# Patient Record
Sex: Female | Born: 1950 | Race: White | Hispanic: No | Smoking: Current every day smoker
Health system: Southern US, Community
[De-identification: ages and names within clinical notes are randomized; demographics above are authoritative.]

## PROBLEM LIST (undated history)

## (undated) DIAGNOSIS — F419 Anxiety disorder, unspecified: Secondary | ICD-10-CM

## (undated) DIAGNOSIS — I10 Essential (primary) hypertension: Secondary | ICD-10-CM

## (undated) DIAGNOSIS — J449 Chronic obstructive pulmonary disease, unspecified: Secondary | ICD-10-CM

## (undated) DIAGNOSIS — K219 Gastro-esophageal reflux disease without esophagitis: Secondary | ICD-10-CM

## (undated) DIAGNOSIS — K439 Ventral hernia without obstruction or gangrene: Secondary | ICD-10-CM

## (undated) DIAGNOSIS — G8929 Other chronic pain: Secondary | ICD-10-CM

## (undated) DIAGNOSIS — C4491 Basal cell carcinoma of skin, unspecified: Secondary | ICD-10-CM

## (undated) DIAGNOSIS — C801 Malignant (primary) neoplasm, unspecified: Secondary | ICD-10-CM

## (undated) DIAGNOSIS — F32A Depression, unspecified: Secondary | ICD-10-CM

## (undated) DIAGNOSIS — F329 Major depressive disorder, single episode, unspecified: Secondary | ICD-10-CM

## (undated) HISTORY — PX: COLON SURGERY: SHX602

## (undated) HISTORY — DX: Anxiety disorder, unspecified: F41.9

## (undated) HISTORY — DX: Chronic obstructive pulmonary disease, unspecified: J44.9

## (undated) HISTORY — DX: Major depressive disorder, single episode, unspecified: F32.9

## (undated) HISTORY — PX: TUBAL LIGATION: SHX77

## (undated) HISTORY — DX: Depression, unspecified: F32.A

---

## 2003-09-16 ENCOUNTER — Other Ambulatory Visit: Payer: Self-pay

## 2005-11-07 ENCOUNTER — Other Ambulatory Visit: Payer: Self-pay

## 2005-11-07 ENCOUNTER — Inpatient Hospital Stay: Payer: Self-pay

## 2006-03-05 ENCOUNTER — Emergency Department: Payer: Self-pay | Admitting: Emergency Medicine

## 2006-03-10 ENCOUNTER — Emergency Department: Payer: Self-pay | Admitting: Emergency Medicine

## 2006-10-10 ENCOUNTER — Emergency Department: Payer: Self-pay | Admitting: Emergency Medicine

## 2007-01-02 ENCOUNTER — Emergency Department: Payer: Self-pay | Admitting: Emergency Medicine

## 2007-02-17 ENCOUNTER — Ambulatory Visit: Payer: Self-pay | Admitting: Family Medicine

## 2007-04-14 ENCOUNTER — Ambulatory Visit: Payer: Self-pay | Admitting: Specialist

## 2007-04-23 ENCOUNTER — Other Ambulatory Visit: Payer: Self-pay

## 2007-04-23 ENCOUNTER — Ambulatory Visit: Payer: Self-pay | Admitting: Specialist

## 2007-04-30 ENCOUNTER — Ambulatory Visit: Payer: Self-pay | Admitting: Specialist

## 2008-05-24 ENCOUNTER — Ambulatory Visit: Payer: Self-pay | Admitting: Family Medicine

## 2008-09-03 ENCOUNTER — Emergency Department: Payer: Self-pay | Admitting: Emergency Medicine

## 2009-04-16 ENCOUNTER — Emergency Department: Payer: Self-pay | Admitting: Emergency Medicine

## 2010-06-19 ENCOUNTER — Encounter: Payer: Self-pay | Admitting: Specialist

## 2010-10-05 ENCOUNTER — Inpatient Hospital Stay: Payer: Self-pay | Admitting: Internal Medicine

## 2011-07-29 DIAGNOSIS — J449 Chronic obstructive pulmonary disease, unspecified: Secondary | ICD-10-CM | POA: Insufficient documentation

## 2011-07-29 DIAGNOSIS — C189 Malignant neoplasm of colon, unspecified: Secondary | ICD-10-CM | POA: Insufficient documentation

## 2011-07-29 DIAGNOSIS — I10 Essential (primary) hypertension: Secondary | ICD-10-CM | POA: Insufficient documentation

## 2011-07-29 DIAGNOSIS — F329 Major depressive disorder, single episode, unspecified: Secondary | ICD-10-CM | POA: Insufficient documentation

## 2011-07-29 DIAGNOSIS — F32A Depression, unspecified: Secondary | ICD-10-CM | POA: Insufficient documentation

## 2011-07-29 DIAGNOSIS — K439 Ventral hernia without obstruction or gangrene: Secondary | ICD-10-CM | POA: Insufficient documentation

## 2011-11-12 ENCOUNTER — Ambulatory Visit: Payer: Self-pay | Admitting: Specialist

## 2011-12-23 ENCOUNTER — Encounter: Payer: Self-pay | Admitting: Specialist

## 2012-01-05 ENCOUNTER — Encounter: Payer: Self-pay | Admitting: Specialist

## 2012-02-04 ENCOUNTER — Encounter: Payer: Self-pay | Admitting: Specialist

## 2012-03-06 ENCOUNTER — Encounter: Payer: Self-pay | Admitting: Specialist

## 2012-04-05 ENCOUNTER — Encounter: Payer: Self-pay | Admitting: Specialist

## 2012-04-27 DIAGNOSIS — G8929 Other chronic pain: Secondary | ICD-10-CM | POA: Insufficient documentation

## 2012-04-30 ENCOUNTER — Ambulatory Visit: Payer: Self-pay | Admitting: Specialist

## 2012-05-06 ENCOUNTER — Encounter: Payer: Self-pay | Admitting: Specialist

## 2012-06-06 ENCOUNTER — Encounter: Payer: Self-pay | Admitting: Specialist

## 2012-10-19 ENCOUNTER — Encounter: Payer: Self-pay | Admitting: Specialist

## 2012-11-06 ENCOUNTER — Encounter: Payer: Self-pay | Admitting: Specialist

## 2012-11-15 ENCOUNTER — Ambulatory Visit: Payer: Self-pay | Admitting: Specialist

## 2012-11-23 DIAGNOSIS — Z720 Tobacco use: Secondary | ICD-10-CM | POA: Insufficient documentation

## 2012-11-23 DIAGNOSIS — F17200 Nicotine dependence, unspecified, uncomplicated: Secondary | ICD-10-CM | POA: Insufficient documentation

## 2012-11-23 DIAGNOSIS — IMO0002 Reserved for concepts with insufficient information to code with codable children: Secondary | ICD-10-CM | POA: Insufficient documentation

## 2012-11-23 DIAGNOSIS — R918 Other nonspecific abnormal finding of lung field: Secondary | ICD-10-CM | POA: Insufficient documentation

## 2012-12-01 ENCOUNTER — Ambulatory Visit: Payer: Self-pay | Admitting: Internal Medicine

## 2012-12-04 ENCOUNTER — Ambulatory Visit: Payer: Self-pay | Admitting: Internal Medicine

## 2012-12-04 ENCOUNTER — Encounter: Payer: Self-pay | Admitting: Specialist

## 2013-01-04 ENCOUNTER — Ambulatory Visit: Payer: Self-pay | Admitting: Internal Medicine

## 2013-01-04 ENCOUNTER — Encounter: Payer: Self-pay | Admitting: Specialist

## 2013-02-07 DIAGNOSIS — D071 Carcinoma in situ of vulva: Secondary | ICD-10-CM | POA: Insufficient documentation

## 2013-02-07 DIAGNOSIS — C44721 Squamous cell carcinoma of skin of unspecified lower limb, including hip: Secondary | ICD-10-CM | POA: Insufficient documentation

## 2013-02-07 DIAGNOSIS — C4491 Basal cell carcinoma of skin, unspecified: Secondary | ICD-10-CM | POA: Insufficient documentation

## 2013-04-11 DIAGNOSIS — Z7189 Other specified counseling: Secondary | ICD-10-CM | POA: Insufficient documentation

## 2013-05-12 ENCOUNTER — Ambulatory Visit: Payer: Self-pay

## 2013-05-16 ENCOUNTER — Ambulatory Visit: Payer: Self-pay | Admitting: Specialist

## 2013-05-24 ENCOUNTER — Ambulatory Visit: Payer: Self-pay | Admitting: Hematology and Oncology

## 2013-06-20 ENCOUNTER — Ambulatory Visit: Payer: Self-pay | Admitting: Gynecologic Oncology

## 2013-06-23 ENCOUNTER — Ambulatory Visit: Payer: Self-pay | Admitting: Medical

## 2013-07-06 ENCOUNTER — Ambulatory Visit: Payer: Self-pay | Admitting: Gynecologic Oncology

## 2013-08-28 ENCOUNTER — Observation Stay: Payer: Self-pay | Admitting: Internal Medicine

## 2013-08-28 LAB — BASIC METABOLIC PANEL
Anion Gap: 8 (ref 7–16)
BUN: 7 mg/dL (ref 7–18)
Calcium, Total: 9.3 mg/dL (ref 8.5–10.1)
Chloride: 91 mmol/L — ABNORMAL LOW (ref 98–107)
Co2: 34 mmol/L — ABNORMAL HIGH (ref 21–32)
Creatinine: 0.46 mg/dL — ABNORMAL LOW (ref 0.60–1.30)
EGFR (African American): >60
EGFR (Non-African Amer.): >60
Glucose: 105 mg/dL — ABNORMAL HIGH (ref 65–99)
Osmolality: 265 (ref 275–301)
Potassium: 3.6 mmol/L (ref 3.5–5.1)
Sodium: 133 mmol/L — ABNORMAL LOW (ref 136–145)

## 2013-08-28 LAB — CBC
HCT: 42 % (ref 35.0–47.0)
HGB: 14.4 g/dL (ref 12.0–16.0)
MCH: 32.9 pg (ref 26.0–34.0)
MCHC: 34.2 g/dL (ref 32.0–36.0)
MCV: 96 fL (ref 80–100)
Platelet: 317 10*3/uL (ref 150–440)
RBC: 4.37 10*6/uL (ref 3.80–5.20)
RDW: 14.3 % (ref 11.5–14.5)
WBC: 6.1 10*3/uL (ref 3.6–11.0)

## 2013-08-28 LAB — TROPONIN I
Troponin-I: <0.02 ng/mL
Troponin-I: <0.02 ng/mL
Troponin-I: <0.02 ng/mL

## 2013-08-28 LAB — PRO B NATRIURETIC PEPTIDE: B-Type Natriuretic Peptide: 64 pg/mL (ref 0–125)

## 2013-08-28 LAB — MAGNESIUM: Magnesium: 1.7 mg/dL — ABNORMAL LOW

## 2013-08-29 LAB — CBC WITH DIFFERENTIAL/PLATELET
Basophil #: 0.1 10*3/uL (ref 0.0–0.1)
Basophil %: 1.1 %
Eosinophil #: 0.2 10*3/uL (ref 0.0–0.7)
Eosinophil %: 3.8 %
HCT: 43.3 % (ref 35.0–47.0)
HGB: 14.4 g/dL (ref 12.0–16.0)
Lymphocyte #: 2.5 10*3/uL (ref 1.0–3.6)
Lymphocyte %: 45.6 %
MCH: 32.4 pg (ref 26.0–34.0)
MCHC: 33.3 g/dL (ref 32.0–36.0)
MCV: 97 fL (ref 80–100)
Monocyte #: 0.6 x10 3/mm (ref 0.2–0.9)
Monocyte %: 11.5 %
Neutrophil #: 2.1 10*3/uL (ref 1.4–6.5)
Neutrophil %: 38 %
Platelet: 310 10*3/uL (ref 150–440)
RBC: 4.44 10*6/uL (ref 3.80–5.20)
RDW: 14 % (ref 11.5–14.5)
WBC: 5.5 10*3/uL (ref 3.6–11.0)

## 2013-08-29 LAB — BASIC METABOLIC PANEL
Anion Gap: 2 — ABNORMAL LOW (ref 7–16)
BUN: 8 mg/dL (ref 7–18)
Calcium, Total: 9.5 mg/dL (ref 8.5–10.1)
Chloride: 95 mmol/L — ABNORMAL LOW (ref 98–107)
Co2: 37 mmol/L — ABNORMAL HIGH (ref 21–32)
Creatinine: 0.51 mg/dL — ABNORMAL LOW (ref 0.60–1.30)
EGFR (African American): >60
EGFR (Non-African Amer.): >60
Glucose: 95 mg/dL (ref 65–99)
Osmolality: 266 (ref 275–301)
Potassium: 3.4 mmol/L — ABNORMAL LOW (ref 3.5–5.1)
Sodium: 134 mmol/L — ABNORMAL LOW (ref 136–145)

## 2013-08-29 LAB — LIPID PANEL
Cholesterol: 172 mg/dL (ref 0–200)
HDL Cholesterol: 65 mg/dL — ABNORMAL HIGH (ref 40–60)
Ldl Cholesterol, Calc: 76 mg/dL (ref 0–100)
Triglycerides: 153 mg/dL (ref 0–200)
VLDL Cholesterol, Calc: 31 mg/dL (ref 5–40)

## 2013-09-13 ENCOUNTER — Ambulatory Visit: Payer: Self-pay | Admitting: Gynecologic Oncology

## 2013-10-06 ENCOUNTER — Ambulatory Visit: Payer: Self-pay | Admitting: Gynecologic Oncology

## 2013-12-06 ENCOUNTER — Ambulatory Visit: Payer: Self-pay | Admitting: Gynecologic Oncology

## 2014-01-04 ENCOUNTER — Ambulatory Visit: Payer: Self-pay | Admitting: Specialist

## 2014-01-16 ENCOUNTER — Ambulatory Visit: Payer: Self-pay | Admitting: Gynecologic Oncology

## 2014-06-09 DIAGNOSIS — F1120 Opioid dependence, uncomplicated: Secondary | ICD-10-CM | POA: Insufficient documentation

## 2014-10-12 DIAGNOSIS — B070 Plantar wart: Secondary | ICD-10-CM | POA: Insufficient documentation

## 2014-10-12 DIAGNOSIS — M79673 Pain in unspecified foot: Secondary | ICD-10-CM | POA: Insufficient documentation

## 2014-10-12 DIAGNOSIS — L851 Acquired keratosis [keratoderma] palmaris et plantaris: Secondary | ICD-10-CM | POA: Insufficient documentation

## 2014-11-06 ENCOUNTER — Ambulatory Visit: Payer: Self-pay | Admitting: Pain Medicine

## 2015-01-26 NOTE — H&P (Signed)
PATIENT NAME:  Shirley Alvarado, Shirley Alvarado MR#:  [id number] DATE OF BIRTH:  1951/02/11  DATE OF ADMISSION:  08/28/2013  ADDENDUM:  The patient is a heavy smoker, ongoing smoking and smoking cessation counseling given to the patient for over 5 minutes. Patient decided to try to quit smoking at her own pace and does not require any help at this moment.    ____________________________ [place] Sink, MD rsg:cs D: 08/28/2013 16:25:18 ET T: 08/28/2013 18:57:19 ET JOB#: [id number]  cc: [place] Sink, MD, <Dictator> Romilda Proby America Brown MD ELECTRONICALLY SIGNED 08/29/2013 13:11

## 2015-01-26 NOTE — H&P (Signed)
PATIENT NAME:  Shirley Alvarado, Shirley Alvarado MR#:  [id number] DATE OF BIRTH:  03-10-1951  DATE OF ADMISSION:  08/28/2013  REFERRING PHYSICIAN: Dr. Conni Slipper.   CHIEF COMPLAINT: Chest pain.   HISTORY OF PRESENT ILLNESS: A very nice 64 year old female with history of COPD with  chronic respiratory failure, oxygen dependent at 2 to 3 L of oxygen at home. Also has depression, history of ongoing smoking, hypertension, colon cancer, and chronic pain syndrome due to abdominal pain secondary to hernia that has a mesh that is creating significant pressure into the abdominal fat. The patient has pain control with her primary care physician, and comes today with a history of chest pain that happened within the last week.  It has been going on and off. The patient says that she has been getting really tired and does not feel quite well. She actually slept for 30 hours in the past 2 days and woke up with significant left arm pain and numbness, which is likely secondary to the position that she was sleeping in. She developed chest pain on the left side, again within the last week. It comes and goes, lasts for 5 to 6 minutes, and then goes away. The patient has these episodes of chest pain happening a lot during the day. They are described as sharp, with no radiation of the pain, with intensity of 6 out of 10, and they are located under the left breast. There is no significant trigger.  Activity or not, the patient could have the pain, resting or loading her dishwasher. The patient has significant shortness of breath, is at her baseline, and now she says this has been going on for the past week. The patient states that actually her breathing is much better than it has been in the past couple of months because she is reducing the amount of cigarettes that she smokes, and there are no increased secretions. No increased shortness of breath. No fever. The patient is admitted for evaluation of chest pain, which is overall reproducible with  palpation, but the patient has multiple risk factors and merits to at least be observed. The patient and family would like to stay and be observed as well.   REVIEW OF SYSTEMS: A 12-system review of systems is done.  CONSTITUTIONAL: No fever. No weight loss or weight gain. Positive fatigue, which is chronic, but it has been exacerbated within the last week.  EYES: No blurry vision, double vision, or glaucoma.  EARS, NOSE, AND THROAT: No tinnitus, ear pain, or difficulty hearing. No epistaxis.  RESPIRATORY: Chronic cough, chronic wheezing, COPD, chronic respiratory failure at 2 to 3 L of oxygen at home.  CARDIOVASCULAR: Positive chest pain as mentioned above, reproducible completely, in a single spot at the level of the margin of the ribs on the left lower chest. No orthopnea, no edema. The patient has chronic chest pain, which she says is pressure-like due to her COPD whenever she tries to take deep breaths, and that has not changed.  GASTROINTESTINAL: No nausea, vomiting, constipation, diarrhea.  GENITOURINARY: No dysuria, hematuria, or changes in frequency.  ENDOCRINE: No polyuria, polydipsia, polyphagia. No cold or heat intolerance.  HEMATOLOGIC AND LYMPHATIC: No anemia, easy bruising, bleeding, or swollen glands.  MUSCULOSKELETAL: No significant joint effusions or gout.  NEUROLOGIC: No numbness, tingling, CVAs, or TIAs.   PSYCHIATRIC: No significant insomnia or depression at this moment. She has a history of chronic depression that has been apparently well controlled.   PAST MEDICAL HISTORY: 1.  COPD.    2.  Chronic respiratory failure, oxygen-dependent, 2 to 3 L.  3.  Depression, stable. 4.  Eczema.  5.  History of colon cancer, now resected.  6.  Hypertension.  7.  Chronic pain syndrome due to abdominal pain.   ALLERGIES: Not known drug allergies.   SOCIAL HISTORY: The patient smokes half to one-third pack a day, started at the age of 34, and she has quit a couple of times for  several months and then started smoking again. She lives with her son, who also has a wife and 2 kids. She uses alcohol very rarely. She used to smoke 2 packs a day prior to that.   FAMILY HISTORY: Positive for renal failure in her father and some type of cancer of unknown origin in her mother, who died at the age of 9.   CURRENT MEDICATIONS: The patient is not sure of the medications that she is taking. She tells me that they might have changed. The last set of medication she had on her chart is theophylline 200 mg every 12 hours, Spiriva 18 mcg once a day, Singulair 10 mg once a day, ProAir HFA 2 puffs every 6 hours, prednisone taper, paroxetine 40 mg daily, oxycodone 30 mg every 4 hours as needed for pain, Nitrostat as needed for chest pain, Nasonex 50 mcg as needed for congestion, hydrochlorothiazide with losartan 25/100 once a day, diazepam 10 mg every 6 hours, amlodipine 10 mg once a day, Advair Diskus 250/50. We are going to call [facility] to figure out if those are the same medications or if there have been any changes.   PAST SURGICAL HISTORY:  1.  Knee surgery.  2.  Colectomy.  3.  Hernioplasty.  4.  Tubal ligation.   PHYSICAL EXAMINATION: VITAL SIGNS:  Blood pressure 131/78, pulse 76, respirations 20, temperature 98.3, oxygen saturation is 96% on 3 L of oxygen.  GENERAL: The patient is alert, oriented x3, in no acute distress. No respiratory distress. Hemodynamically stable.  HEENT: Pupils are equal and reactive. Extraocular movements are intact. Mucosa are moist. Anicteric sclerae. Pink conjunctivae. No oral lesions. No oropharyngeal exudates.  NECK: Supple. No JVD. No thyromegaly. No adenopathy. No carotid bruits. No rigidity.  CARDIOVASCULAR: Regular rate and rhythm. No murmurs, rubs, or gallops are appreciated at this moment. The patient has a fully reproducible chest pain with palpation and pressure at the level of the left costophrenic under her left breast, which she feels is exactly  the same as the pain that she has been having recently. No displacement of PMI.  LUNGS: Clear without any wheezing or crepitus at this moment. Decreased respiratory sounds on bases are positive, but no wheezing, no crackles, no rhonchi at this moment. There is no use of accessory muscles.  ABDOMEN: Soft. Has significant incision in the midline that is healed, without any breakdown of the skin. She has multiple protrusions under the skin from possible intestine or abdominal fat, which is secondary to ventral hernia. There are no signs of any infection of the skin. Tenderness to palpation throughout all of the stomach, which is chronic. No significant guarding. No acute changes. Bowel sounds are positive.  GENITAL: Deferred.  EXTREMITIES: No edema, cyanosis, or clubbing.  VASCULAR: Pulses +2. Capillary refill less than 3.  LYMPHATIC: Negative for lymphadenopathy in the neck or supraclavicular areas.  SKIN: No rashes, petechiae, or sites of infections.  MUSCULOSKELETAL: No significant abnormalities of joints or joint effusions. No deformity.  NEUROLOGIC: Cranial nerves  II through XII intact. No focal findings. The strength is 5/5 in all 4 extremities.  PSYCHIATRIC: No significant agitation, hallucinations. The patient is alert, oriented x3.  LABORATORY, DIAGNOSTIC AND RADIOLOGICAL DATA: Her glucose is 105, BUN is 7, creatinine 0.46, sodium 133, chloride 91, CO2 of 34, troponin 0.02, white count 6.1, hemoglobin 14.4, platelet count 314.   Chest x-ray: No significant acute findings, although, as described by the radiologist, an ill-defined opacity on the medial aspect of the right lower lung that could represent development of airspace consolidation. Clinical correlation is recommended. When compared this chest x-ray with previous x-rays in 2011, it is exactly the same, no significant health changes. The patient does not have any worsening of the cough. She actually feels like she is better on her breathing  pattern, and she feels like she is producing less secretions because she is smoking less. There is no fever. No increase in white count, for what I do not think this is a pertinent finding. Patient is going to have a CT scan done within the next month for followup on pulmonary nodule that has been seen on CT scan back in February 2014, and then on 05/16/2013. The patient is actually stable, and she is okay waiting for a CT scan outpatient.   ASSESSMENT AND PLAN:  1.  Chest pain. This is likely musculoskeletal, fully reproducible, but the patient has significant risk factors and she is also not comfortable going home at this moment. We are going to admit her and put her under observation. Cardiac enzymes are going to be checked x3. I do not think that she needs a stress test due to the physical findings and reproducibility. We are going to do cardiac enzymes. If the cardiac enzymes are negative, the patient can go home tomorrow. We are only going to do the cardiac enzymes. I do not think she needs any further work-up.  2.  As far as her pain, we are going to continue treatment with local therapy. The patient can have a heating pad and try to avoid any contact with the affected area. As far as her medications, we are going to do aspirin for now until we figure out if there is any change in her troponin. The patient is not going to be on a beta blocker due to her severe chronic obstructive pulmonary disease with respiratory failure. Nitroglycerin p.r.n. Continue management of chronic abdominal pain.  3.  Chronic obstructive pulmonary disease. No signs of exacerbation. No signs of pneumonia, clinically. We are not going to start her on any antibiotics or new steroids.  4.  History of pulmonary nodule. Followup CT with Dr. Raul Del.  5.  Depression seems to be stable. Continue medication management.  6.  Chronic respiratory failure. Continue oxygen 2 to 3 L nasal cannula.  7.  Deep vein thrombosis prophylaxis  with heparin and gastrointestinal prophylaxis with proton pump inhibitor.   TIME SPENT: I spent about 45 minutes with this patient.     ____________________________ [place] Sink, MD rsg:cg D: 08/28/2013 16:23:11 ET T: 08/29/2013 04:10:06 ET JOB#: [id number]  cc:   Cristi Loron MD ELECTRONICALLY SIGNED 08/29/2013 13:12

## 2015-01-26 NOTE — Discharge Summary (Signed)
PATIENT NAME:  Shirley Alvarado, Shirley Alvarado MR#:  [id number] DATE OF BIRTH:  January 19, 1951  DATE OF ADMISSION:  08/28/2013 DATE OF DISCHARGE:  08/29/2013  ADMITTING PHYSICIAN: Dr. Laurin Coder  DISCHARGING PHYSICIAN: [place] Lighter, M.D.   PRIMARY CARE PHYSICIAN: Lelon Huh, MD  PRIMARY PAIN MANAGEMENT:  [facility]   DISCHARGE DIAGNOSES: 1.  Musculoskeletal chest pain.  2.  Hyperkalemia.  3. Chronic respiratory failure from chronic obstructive pulmonary disease on 2 to 3 liters of home oxygen.  4.  Tobacco use disorder.  5.  Hypertension.  6.  History of colon cancer status post resection.  7.  Eczema.  8.  Depression.   DISCHARGE HOME MEDICATIONS:  1.  3 liter oxygen via nasal cannula continuous.  2.  Norvasc 10 mg p.o. daily.  3.  Advair 250/50 one puff b.i.d. .  4.  Theophylline 200 mg p.o. b.i.d.  5.  Losartan HCTZ 300/25 mg 1 tablet p.o. daily.  6.  Nasonex two sprays each nostril daily.  7. Liothyronine 50 mcg p.o. daily.  8.  Benadryl 25 mg q. 6-8 hours p.r.n. for itching.  9.  Diazepam 10 mg q. 8 hours p.r.n.  10.  Sublingual nitroglycerin 1 tablet every five minutes as needed for chest pain.  11. Oxycodone 30 mg 4 times a day for pain and use 1 to 2 tablets once a day as needed for break through pain.  12.  Paroxetine 40 mg p.o. daily.  13.  ProAir inhaler 2 q. 6 hours puffs p.r.n. for wheezing.  14.  Montelukast 10 mg p.o. daily.  15.  Spiriva 18 mcg inhalation daily.  16.  Triamcinolone ointment 0.1% twice a day.  17.  Ranitidine 150 mg p.o. b.i.d.  18.  Aspirin 81 mg p.o. daily.   DISCHARGE DIET: Low-sodium diet.   DISCHARGE ACTIVITY: As tolerated.   HOME OXYGEN: 3 liters.    FOLLOWUP INSTRUCTIONS:   1.  PCP follow-up in 2 weeks to see if she needs an outpatient stress test.  2.  Continue home medications as no changes were made.   LABORATORY, DIAGNOSTIC AND RADIOLOGIC DATA PRIOR TO DISCHARGE:  WBC 5.5, hemoglobin 14.4, hematocrit 43.3, platelet count is 310.   Sodium  134, potassium 3.4, chloride 95, bicarbonate 37, BUN 8, creatinine 0.5 and glucose 95 and calcium of 9.5.   LDL 76, HDL 65, total cholesterol 172, triglycerides 153, troponin 3 sets remained negative in the hospital.   Chest x-ray on admission showing ill-defined opacity along the right lower lung could represent air space consolidation or aspiration pneumonia, atherosclerosis also noted.   BRIEF HOSPITAL COURSE: Ms. Viscuso is  a 64 year old Caucasian female with past medical history significant for chronic respiratory failure secondary to chronic obstructive pulmonary disease on 3 liters home oxygen, chronic pain, abdominal pain secondary to hernia repair and mesh placement following up with pain management clinic, ongoing smoking, depression, and history of colon cancer, hypertension, presents her hospital secondary to chest pain.   Chest pain. Atypical chest pain, likely musculoskeletal was reproducible,  resolved after admission. The patient is extremely nervous with her risk factors, wanted observed in the hospital. Her troponins were done, which were negative. The patient has not had further chest pain, and she will follow up with her PCP for outpatient stress test. She was ambulating without any distress while in the hospital. All her other home medications were continued without any changes and she is being discharged in stable condition.   DISCHARGE DISPOSITION: Home.   TIME SPENT ON  DISCHARGE: 40 minutes.    ____________________________ [place] Lighter, MD rk:cc D: 08/29/2013 14:53:12 ET T: 08/29/2013 22:16:01 ET JOB#: [id number]  cc: [place] Lighter, MD, <Dictator> Gladstone Lighter MD ELECTRONICALLY SIGNED 08/30/2013 18:28

## 2015-04-20 ENCOUNTER — Encounter: Payer: Self-pay | Admitting: Psychiatry

## 2015-04-20 ENCOUNTER — Ambulatory Visit (INDEPENDENT_AMBULATORY_CARE_PROVIDER_SITE_OTHER): Payer: Medicaid Other | Admitting: Psychiatry

## 2015-04-20 VITALS — BP 98/56 | HR 77 | Resp 12 | Ht 62.0 in

## 2015-04-20 DIAGNOSIS — F331 Major depressive disorder, recurrent, moderate: Secondary | ICD-10-CM

## 2015-04-20 MED ORDER — PAROXETINE HCL 10 MG PO TABS: ORAL_TABLET | ORAL | Status: DC

## 2015-04-20 MED ORDER — SERTRALINE HCL 25 MG PO TABS: ORAL_TABLET | ORAL | Status: DC
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
## 2015-04-25 ENCOUNTER — Telehealth: Payer: Self-pay

## 2015-04-25 NOTE — Telephone Encounter (Signed)
pt called states that her sister husband just died, and her sister has stage 4 cancer and she having alot of anxiety and she wanted to know if she can just go ahead and start the zolof. she needs something to help her get threw this.

## 2015-05-11 NOTE — Telephone Encounter (Signed)
I spoke with patient today. Did apologize for the delay in calling her back. She indicated that she has been on the Zoloft now and will increase from the 25 mg to 50 mg tomorrow. She states she has had some GI upset since starting the Zoloft but today she did not have any. She relates she was taking some Alka-Seltzer for her stomach upset. I've instructed to proceed with the increase but should she have any more GI upset to feel free to call. Patient states she continued to use her Valium at the previous dose given the various issues such as the death of her brother-in-law and health problems with her sister. She is aware she has followed with me on 05/22/2015. AW

## 2015-05-22 ENCOUNTER — Ambulatory Visit (INDEPENDENT_AMBULATORY_CARE_PROVIDER_SITE_OTHER): Payer: 59 | Admitting: Psychiatry

## 2015-05-22 ENCOUNTER — Encounter: Payer: Self-pay | Admitting: Psychiatry

## 2015-05-22 VITALS — BP 124/68 | HR 95 | Temp 97.8°F | Ht 62.0 in | Wt 172.6 lb

## 2015-05-22 DIAGNOSIS — F331 Major depressive disorder, recurrent, moderate: Secondary | ICD-10-CM | POA: Diagnosis not present

## 2015-05-22 MED ORDER — SERTRALINE HCL 50 MG PO TABS: 75.0000 mg | ORAL_TABLET | Freq: Every day | ORAL | Status: DC

## 2015-05-22 MED ORDER — DIAZEPAM 10 MG PO TABS: 10.0000 mg | ORAL_TABLET | Freq: Two times a day (BID) | ORAL | Status: DC | PRN

## 2015-05-22 NOTE — Progress Notes (Signed)
BH MD/PA/NP OP Progress Note  05/22/2015 11:08 AM Shirley Alvarado  MRN:  [id number]  Subjective:  Patient returns for follow-up of her depression. She has now been on her sertraline for 2 weeks. She is been taking the 50 mg. She reports that she may be feels a little bit better on this medication. She states others have said that she looks better. She states she continues to have episodes of depression now she is able to state that when she was interacting with her grandchildren she was extremely happy and that happiness lasted for about 2-3 days after she had a visit with them. She has an upcoming visit with them on 05/25/2015 and looks forward to that. We again discussed that ideally want to taper down and ideally off her Valium. I've instructed to take Valium 10 mg twice a day as needed as opposed to her original dose which was 10 mg 3 times a day as needed.  She continues to struggle with the grief from the death of her relative but seems to be hopeful that the sertraline will continue to be helpful for her depression as well as anxiety. Chief Complaint:  Visit Diagnosis:  No diagnosis found.  Past Medical History:  Past Medical History  Diagnosis Date  . Anxiety   . Depression   . COPD (chronic obstructive pulmonary disease)     Past Surgical History  Procedure Laterality Date  . Colon surgery     Family History:  Family History  Problem Relation Age of Onset  . Lung cancer Mother   . Hypertension Mother   . Diabetes Mother   . Anxiety disorder Mother   . Depression Mother   . Hypertension Father   . Diabetes Father   . Heart attack Father   . Depression Father   . Anxiety disorder Father   . Alcohol abuse Father   . Drug abuse Father   . Cancer Sister   . Diabetes Sister   . Hypertension Sister   . Obesity Sister   . Hypertension Brother   . Hypertension Brother   . Liver disease Brother   . Colon cancer Brother    Social History:  Social History   Social History   . Marital Status: Widowed    Spouse Name: N/A  . Number of Children: N/A  . Years of Education: N/A   Social History Main Topics  . Smoking status: Current Every Day Smoker -- 0.50 packs/day for 50 years    Types: Cigarettes    Start date: 05/21/1970  . Smokeless tobacco: Never Used     Comment: However patient relates that she smoked for 50 years and at one point she was up to 2 packs per day  . Alcohol Use: No  . Drug Use: No  . Sexual Activity: No   Other Topics Concern  . None   Social History Narrative   Additional History:   Assessment:   Musculoskeletal: Strength & Muscle Tone: within normal limits Gait & Station: Slow  Patient leans: N/A  Psychiatric Specialty Exam: HPI  Review of Systems  Psychiatric/Behavioral: Positive for depression. Negative for suicidal ideas, hallucinations, memory loss and substance abuse. The patient is nervous/anxious. The patient does not have insomnia.     Blood pressure 124/68, pulse 95, temperature 97.8 F (36.6 C), temperature source Tympanic, height 5\' 2"  (1.575 m), weight 172 lb 9.6 oz (78.291 kg), SpO2 91 %.Body mass index is 31.56 kg/(m^2).  General Appearance: Fairly Groomed  Eye  Contact:  Good  Speech:  Normal Rate  Volume:  Normal  Mood:  A little better  Affect:  Full Range and Somewhat tearful when discussing the fact that she was told her oxygen level was low  Thought Process:  Linear  Orientation:  Full (Time, Place, and Person)  Thought Content:  Negative  Suicidal Thoughts:  No  Homicidal Thoughts:  No  Memory:  Immediate;   Good Recent;   Good Remote;   Good  Judgement:  Good  Insight:  Good  Psychomotor Activity:  Negative  Concentration:  Good  Recall:  Good  Fund of Knowledge: Good  Language: Good  Akathisia:  Negative  Handed:  Right unknown   AIMS (if indicated):  N/A  Assets:  Communication Skills Desire for Improvement  ADL's:  Intact  Cognition: WNL  Sleep:  good   Is the patient at risk  to self?  No. Has the patient been a risk to self in the past 6 months?  No. Has the patient been a risk to self within the distant past?  No. Is the patient a risk to others?  No. Has the patient been a risk to others in the past 6 months?  No. Has the patient been a risk to others within the distant past?  No.  Current Medications: Current Outpatient Prescriptions  Medication Sig Dispense Refill  . albuterol (PROAIR HFA) 108 (90 BASE) MCG/ACT inhaler Inhale into the lungs.    Marland Kitchen amLODipine (NORVASC) 10 MG tablet Take by mouth.    . diazepam (VALIUM) 10 MG tablet Take 1 tablet (10 mg total) by mouth 2 (two) times daily as needed for anxiety. 60 tablet 1  . diazepam (VALIUM) 10 MG tablet Take 1 tablet (10 mg total) by mouth 2 (two) times daily as needed for anxiety. 60 tablet 1  . diazepam (VALIUM) 10 MG tablet Take 1 tablet (10 mg total) by mouth 2 (two) times daily as needed for anxiety. 60 tablet 1  . dicyclomine (BENTYL) 10 MG capsule TAKE 1 CAPSULE (10 MG TOTAL) BY MOUTH 4 (FOUR) TIMES DAILY BEFORE MEALS AND NIGHTLY.    . fluticasone (FLONASE) 50 MCG/ACT nasal spray Place into the nose.    Marland Kitchen Fluticasone-Salmeterol (ADVAIR DISKUS) 250-50 MCG/DOSE AEPB Inhale into the lungs.    Marland Kitchen liothyronine (CYTOMEL) 50 MCG tablet Take by mouth.    . loratadine (CLARITIN) 10 MG tablet Take by mouth.    . losartan-hydrochlorothiazide (HYZAAR) 100-25 MG per tablet Take by mouth.    . losartan-hydrochlorothiazide (HYZAAR) 100-25 MG per tablet TAKE 1 TABLET BY MOUTH DAILY.    . montelukast (SINGULAIR) 10 MG tablet TAKE 1 TABLET BY MOUTH NIGHTLY    . nystatin cream (MYCOSTATIN) Apply topically.    Marland Kitchen oxyCODONE (ROXICODONE) 15 MG immediate release tablet Take by mouth.    . ranitidine (ZANTAC) 150 MG capsule Take by mouth.    . sertraline (ZOLOFT) 25 MG tablet One tablet in the morning for 7 days then increase to 2 tablets in the morning. 60 tablet 0  . theophylline (UNIPHYL) 400 MG 24 hr tablet Take by  mouth.    . tiotropium (SPIRIVA HANDIHALER) 18 MCG inhalation capsule Place into inhaler and inhale.    . sertraline (ZOLOFT) 50 MG tablet Take 1.5 tablets (75 mg total) by mouth daily. 45 tablet 1   No current facility-administered medications for this visit.    Medical Decision Making:  Established Problem, Stable/Improving (1) and Review of New Medication or  Change in Dosage (2)  Treatment Plan Summary:Medication management and Plan The patient will decrease her dose of Valium to 10 mg twice a day as needed. She will increase her sertraline from 50 mg daily to 75 mg daily. She'll follow up in 1 month. She's been encouraged call any questions or concerns prior to her next appointment.   Faith Rogue 05/22/2015, 11:08 AM

## 2015-05-23 ENCOUNTER — Telehealth: Payer: Self-pay

## 2015-05-23 NOTE — Telephone Encounter (Signed)
spoke with patient, check on patient to see how she was feeling. yesterday pt o2 was low and i called and left a message with patient dr. Raul Del. pt states she has not heard from dr. Raul Del office.  pt states that instead of using 2.5 o2 is she is got the tank set for 4.  Pt check her o2 while on the phone and it was 90 pt was advise to not get it lower then that.  Pt states she is going to see her new pcp tomorrow. Pt was told that I would call her back on Friday and see how she is doing and how her appt went with new doctor.

## 2015-05-25 NOTE — Telephone Encounter (Signed)
pt states she did go see her new pcp and that they are working on getting her pain medication and that he o2 was ordered at 3. pt states she still feels tired and that she in the bed.

## 2015-06-03 ENCOUNTER — Telehealth (HOSPITAL_COMMUNITY): Payer: Self-pay | Admitting: Behavioral Health

## 2015-06-03 NOTE — [facility] Counselor (Signed)
Writer received a phone call from the patient stating they are having an increase of crying spells. Stated they were NOT suicidal and wasn't having any thoughts of hurting themselves or no one else. Patient states, "I just want someone to talk to." Writer gave them the number for [facility] [phone]). Writer also advised the patient, if they feel unsafe and believe they are going to hurt themselves, call 911 and/or go to their nearest ER.

## 2015-06-05 ENCOUNTER — Telehealth: Payer: Self-pay | Admitting: Psychiatry

## 2015-06-06 ENCOUNTER — Ambulatory Visit (INDEPENDENT_AMBULATORY_CARE_PROVIDER_SITE_OTHER): Payer: 59 | Admitting: Psychiatry

## 2015-06-06 ENCOUNTER — Encounter: Payer: Self-pay | Admitting: Psychiatry

## 2015-06-06 VITALS — BP 124/78 | HR 107 | Temp 98.1°F | Ht 62.0 in | Wt 162.2 lb

## 2015-06-06 DIAGNOSIS — F331 Major depressive disorder, recurrent, moderate: Secondary | ICD-10-CM

## 2015-06-06 MED ORDER — SERTRALINE HCL 100 MG PO TABS: 100.0000 mg | ORAL_TABLET | Freq: Every day | ORAL | Status: DC

## 2015-06-06 MED ORDER — QUETIAPINE FUMARATE 50 MG PO TABS: 50.0000 mg | ORAL_TABLET | Freq: Every day | ORAL | Status: DC

## 2015-06-06 NOTE — Progress Notes (Signed)
BH MD/PA/NP OP Progress Note  06/06/2015 3:05 PM Shirley Alvarado  MRN:  [id number]  Subjective:  Patient returns for follow-up of her depression. Patient states that the biggest issues for her right now are crying and difficulty sleeping. Notes and patient report that she did contact the crisis line on Sunday. She emphasizes that that time she was not suicidal and she does the same today.  She indicates that there have been some changes later to her pain medication. I'm not able to see the exact medication change. However she states that her previous doctor Dr. Caryn Section had her on her regimen and that the nurse practitioner that took over his case load change the medication. She states he laced a call to them and made some recommendations for adjusting this medicine and she is going to be following up with them on Friday.  She indicates that since the changes and the pain medication it is affected her ability to sleep well because of the pain/new medication.  She emphasizes she's not suicidal and actually wants to be able to spend time with her grandchildren this weekend. She states she really would like to sleep. I discussed that since her mood seems to be up and down and she is tearful that perhaps we augment her antidepressant with a mood stabilizer that could also augment for depression. We discussed the risk and benefits of Seroquel and she is agreeable to a trial of this. Chief Complaint:  Chief Complaint    Follow-up; Anxiety; Fatigue     Visit Diagnosis:  No diagnosis found.  Past Medical History:  Past Medical History  Diagnosis Date  . Anxiety   . Depression   . COPD (chronic obstructive pulmonary disease)     Past Surgical History  Procedure Laterality Date  . Colon surgery     Family History:  Family History  Problem Relation Age of Onset  . Lung cancer Mother   . Hypertension Mother   . Diabetes Mother   . Anxiety disorder Mother   . Depression Mother   . Hypertension  Father   . Diabetes Father   . Heart attack Father   . Depression Father   . Anxiety disorder Father   . Alcohol abuse Father   . Drug abuse Father   . Cancer Sister   . Diabetes Sister   . Hypertension Sister   . Obesity Sister   . Hypertension Brother   . Hypertension Brother   . Liver disease Brother   . Colon cancer Brother    Social History:  Social History   Social History  . Marital Status: Widowed    Spouse Name: N/A  . Number of Children: N/A  . Years of Education: N/A   Social History Main Topics  . Smoking status: Current Every Day Smoker -- 0.50 packs/day for 50 years    Types: Cigarettes    Start date: 05/21/1970  . Smokeless tobacco: Never Used     Comment: However patient relates that she smoked for 50 years and at one point she was up to 2 packs per day  . Alcohol Use: No  . Drug Use: No  . Sexual Activity: No   Other Topics Concern  . None   Social History Narrative   Additional History:   Assessment:   Musculoskeletal: Strength & Muscle Tone: within normal limits Gait & Station: Slow  Patient leans: N/A  Psychiatric Specialty Exam: Anxiety Symptoms include insomnia and nervous/anxious behavior. Patient reports no suicidal  ideas.      Review of Systems  Psychiatric/Behavioral: Positive for depression. Negative for suicidal ideas, hallucinations, memory loss and substance abuse. The patient is nervous/anxious and has insomnia.     Blood pressure 124/78, pulse 107, temperature 98.1 F (36.7 C), temperature source Tympanic, height 5\' 2"  (1.575 m), weight 162 lb 3.2 oz (73.573 kg), SpO2 94 %.Body mass index is 29.66 kg/(m^2).  General Appearance: Fairly Groomed  Eye Contact:  Good  Speech:  Normal Rate  Volume:  Normal  Mood:  A little better  Affect:  tearful, was able to calm down by the end of the appointment   Thought Process:  Linear  Orientation:  Full (Time, Place, and Person)  Thought Content:  Negative  Suicidal Thoughts:  No   Homicidal Thoughts:  No  Memory:  Immediate;   Good Recent;   Good Remote;   Good  Judgement:  Good  Insight:  Good  Psychomotor Activity:  Negative  Concentration:  Good  Recall:  Good  Fund of Knowledge: Good  Language: Good  Akathisia:  Negative  Handed:  Right unknown   AIMS (if indicated):  N/A  Assets:  Communication Skills Desire for Improvement  ADL's:  Intact  Cognition: WNL  Sleep:  good   Is the patient at risk to self?  No. Has the patient been a risk to self in the past 6 months?  No. Has the patient been a risk to self within the distant past?  No. Is the patient a risk to others?  No. Has the patient been a risk to others in the past 6 months?  No. Has the patient been a risk to others within the distant past?  No.  Current Medications: Current Outpatient Prescriptions  Medication Sig Dispense Refill  . albuterol (PROAIR HFA) 108 (90 BASE) MCG/ACT inhaler Inhale into the lungs.    Marland Kitchen amLODipine (NORVASC) 10 MG tablet Take by mouth.    . dicyclomine (BENTYL) 10 MG capsule TAKE 1 CAPSULE (10 MG TOTAL) BY MOUTH 4 (FOUR) TIMES DAILY BEFORE MEALS AND NIGHTLY.    . fluticasone (FLONASE) 50 MCG/ACT nasal spray Place into the nose.    Marland Kitchen Fluticasone-Salmeterol (ADVAIR DISKUS) 250-50 MCG/DOSE AEPB Inhale into the lungs.    Marland Kitchen liothyronine (CYTOMEL) 50 MCG tablet Take by mouth.    . loratadine (CLARITIN) 10 MG tablet Take by mouth.    . losartan-hydrochlorothiazide (HYZAAR) 100-25 MG per tablet TAKE 1 TABLET BY MOUTH DAILY.    . meloxicam (MOBIC) 15 MG tablet Take by mouth.    . montelukast (SINGULAIR) 10 MG tablet TAKE 1 TABLET BY MOUTH NIGHTLY    . nystatin cream (MYCOSTATIN) Apply topically.    . ranitidine (ZANTAC) 150 MG capsule Take by mouth.    . sertraline (ZOLOFT) 100 MG tablet Take 1 tablet (100 mg total) by mouth daily. 30 tablet 1  . theophylline (UNIPHYL) 400 MG 24 hr tablet Take by mouth.    . tiotropium (SPIRIVA HANDIHALER) 18 MCG inhalation capsule  Place into inhaler and inhale.    . diazepam (VALIUM) 10 MG tablet Take 1 tablet (10 mg total) by mouth 2 (two) times daily as needed for anxiety. (Patient not taking: Reported on 06/06/2015) 60 tablet 1  . QUEtiapine (SEROQUEL) 50 MG tablet Take 1 tablet (50 mg total) by mouth at bedtime. 30 tablet 1   No current facility-administered medications for this visit.    Medical Decision Making:  Established Problem, Stable/Improving (1) and Review of  New Medication or Change in Dosage (2)  Treatment Plan Summary:Medication management and Plan The patient will decrease her dose of Valium to 10 mg twice a day as needed. I have discussed with her in the past that we do need to work towards decreasing the Valium and patient is agreeable to this. However given that she's been somewhat labile and tearful I do not want to do this at this time. She will increase her sertraline to 100 mg daily. We will start some Seroquel 50 mg at bedtime for mood stabilization and augmentation for depression and possibly to help with her insomnia. Patient will follow up in 2 weeks. She's been encouraged to call this Probation officer and informed him about the results of her sleep with the Seroquel.  Faith Rogue 06/06/2015, 3:05 PM

## 2015-06-19 NOTE — Telephone Encounter (Signed)
This was a telephone note

## 2015-06-20 ENCOUNTER — Ambulatory Visit (INDEPENDENT_AMBULATORY_CARE_PROVIDER_SITE_OTHER): Payer: 59 | Admitting: Psychiatry

## 2015-06-20 ENCOUNTER — Encounter: Payer: Self-pay | Admitting: Psychiatry

## 2015-06-20 VITALS — BP 120/68 | HR 92 | Temp 97.8°F | Ht 62.0 in | Wt 163.8 lb

## 2015-06-20 DIAGNOSIS — F331 Major depressive disorder, recurrent, moderate: Secondary | ICD-10-CM

## 2015-06-20 MED ORDER — DIAZEPAM 10 MG PO TABS: ORAL_TABLET | ORAL | Status: DC

## 2015-06-21 ENCOUNTER — Encounter: Payer: Self-pay | Admitting: Psychiatry

## 2015-06-21 NOTE — Progress Notes (Signed)
BH MD/PA/NP OP Progress Note  06/21/2015 8:33 AM Shirley Alvarado  MRN:  [id number]  Subjective:  Patient returns for follow-up of her depression. She is now been taking sertraline 100 mg daily. Patient reports she is feeling better. She also indicates that the Seroquel helped her sleep and she feels her mood has improved. She stated she was able to engage with her grandchildren and she was happy about that. Her appetite is been good. She feels that she is less depressed.  We discussed the issue of Valium which she has been on for years. I did discuss with her that she is on this and opioids and that it would be best for [place] to decrease and ultimately get her off of this medicine. Thus she has agreed to decrease her dose. She currently does have a supply of Valium 10 mg twice daily. However I did contact the pharmacy and discontinue the refill on that dose so she should finish with that dose around October 10 and then her next prescription will be for 5 mg in the morning and 10 mg in the afternoon. We will contact 10 you to try to decrease her dose of this medicine. Patient did express some understanding of the reasoning for this. Chief Complaint: better  Visit Diagnosis:  No diagnosis found.  Past Medical History:  Past Medical History  Diagnosis Date  . Anxiety   . Depression   . COPD (chronic obstructive pulmonary disease)     Past Surgical History  Procedure Laterality Date  . Colon surgery     Family History:  Family History  Problem Relation Age of Onset  . Lung cancer Mother   . Hypertension Mother   . Diabetes Mother   . Anxiety disorder Mother   . Depression Mother   . Hypertension Father   . Diabetes Father   . Heart attack Father   . Depression Father   . Anxiety disorder Father   . Alcohol abuse Father   . Drug abuse Father   . Cancer Sister   . Diabetes Sister   . Hypertension Sister   . Obesity Sister   . Hypertension Brother   . Hypertension Brother   . Liver  disease Brother   . Colon cancer Brother    Social History:  Social History   Social History  . Marital Status: Widowed    Spouse Name: N/A  . Number of Children: N/A  . Years of Education: N/A   Social History Main Topics  . Smoking status: Current Every Day Smoker -- 0.50 packs/day for 50 years    Types: Cigarettes    Start date: 05/21/1970  . Smokeless tobacco: Never Used     Comment: However patient relates that she smoked for 50 years and at one point she was up to 2 packs per day  . Alcohol Use: No  . Drug Use: No  . Sexual Activity: No   Other Topics Concern  . None   Social History Narrative   Additional History:   Assessment:   Musculoskeletal: Strength & Muscle Tone: within normal limits Gait & Station: Slow  Patient leans: N/A  Psychiatric Specialty Exam: Anxiety Symptoms include nervous/anxious behavior. Patient reports no insomnia or suicidal ideas.      Review of Systems  Psychiatric/Behavioral: Positive for depression (states that this is improved). Negative for suicidal ideas, hallucinations, memory loss and substance abuse. The patient is nervous/anxious. The patient does not have insomnia.     Blood pressure  120/68, pulse 92, temperature 97.8 F (36.6 C), temperature source Tympanic, height 5\' 2"  (1.575 m), weight 163 lb 12.8 oz (74.299 kg), SpO2 91 %.Body mass index is 29.95 kg/(m^2).  General Appearance: Fairly Groomed  Eye Contact:  Good  Speech:  Normal Rate  Volume:  Normal  Mood:  better  Affect:  Brighter, not tearful as in last visit  Thought Process:  Linear  Orientation:  Full (Time, Place, and Person)  Thought Content:  Negative  Suicidal Thoughts:  No  Homicidal Thoughts:  No  Memory:  Immediate;   Good Recent;   Good Remote;   Good  Judgement:  Good  Insight:  Good  Psychomotor Activity:  Negative  Concentration:  Good  Recall:  Good  Fund of Knowledge: Good  Language: Good  Akathisia:  Negative  Handed:  Right  unknown   AIMS (if indicated):  N/A  Assets:  Communication Skills Desire for Improvement  ADL's:  Intact  Cognition: WNL  Sleep:  good   Is the patient at risk to self?  No. Has the patient been a risk to self in the past 6 months?  No. Has the patient been a risk to self within the distant past?  No. Is the patient a risk to others?  No. Has the patient been a risk to others in the past 6 months?  No. Has the patient been a risk to others within the distant past?  No.  Current Medications: Current Outpatient Prescriptions  Medication Sig Dispense Refill  . albuterol (PROAIR HFA) 108 (90 BASE) MCG/ACT inhaler Inhale into the lungs.    Marland Kitchen amLODipine (NORVASC) 10 MG tablet Take by mouth.    . diazepam (VALIUM) 10 MG tablet Take one half a tablet in the morning and one whole tablet in the evening,. 45 tablet 0  . dicyclomine (BENTYL) 10 MG capsule TAKE 1 CAPSULE (10 MG TOTAL) BY MOUTH 4 (FOUR) TIMES DAILY BEFORE MEALS AND NIGHTLY.    . fentaNYL (DURAGESIC - DOSED MCG/HR) 25 MCG/HR patch Place onto the skin.    . fluticasone (FLONASE) 50 MCG/ACT nasal spray Place into the nose.    Marland Kitchen Fluticasone-Salmeterol (ADVAIR DISKUS) 250-50 MCG/DOSE AEPB Inhale into the lungs.    Marland Kitchen liothyronine (CYTOMEL) 50 MCG tablet Take by mouth.    . loratadine (CLARITIN) 10 MG tablet Take by mouth.    . losartan-hydrochlorothiazide (HYZAAR) 100-25 MG per tablet TAKE 1 TABLET BY MOUTH DAILY.    . meloxicam (MOBIC) 15 MG tablet Take by mouth.    . montelukast (SINGULAIR) 10 MG tablet TAKE 1 TABLET BY MOUTH NIGHTLY    . nystatin cream (MYCOSTATIN) Apply topically.    Marland Kitchen QUEtiapine (SEROQUEL) 50 MG tablet Take 1 tablet (50 mg total) by mouth at bedtime. 30 tablet 1  . ranitidine (ZANTAC) 150 MG capsule Take by mouth.    . sertraline (ZOLOFT) 100 MG tablet Take 1 tablet (100 mg total) by mouth daily. 30 tablet 1  . theophylline (UNIPHYL) 400 MG 24 hr tablet Take by mouth.    . tiotropium (SPIRIVA HANDIHALER) 18 MCG  inhalation capsule Place into inhaler and inhale.    . oxyCODONE (ROXICODONE) 15 MG immediate release tablet      No current facility-administered medications for this visit.    Medical Decision Making:  Established Problem, Stable/Improving (1) and Review of New Medication or Change in Dosage (2)  Treatment Plan Summary:Medication management and Plan The patient will decrease her dose of Valium to 10  mg in the morning and 5 mg in the afternoon starting in the second week of October. He'll follow-up in 2 months I have discussed with her in the past that we do need to work towards decreasing the Valium and patient is agreeable to this. However given that she's been somewhat labile and tearful I do not want to do this at this time. Continue  sertraline to 100 mg daily and Seroquel 50 mg at bedtime for mood stabilization and augmentation for depression.   Major depression-continue sertraline 100 mg and Seroquel at 50 mg at bedtime.  Anxiety-we will continue to try to taper her off her Valium. Ultimately will use the sertraline to address anxiety symptoms and perhaps discuss other agents such as BuSpar, Vistaril to assist with anxiety in the future. Faith Rogue 06/21/2015, 8:33 AM

## 2015-06-28 ENCOUNTER — Ambulatory Visit: Payer: Self-pay | Admitting: Psychiatry

## 2015-07-05 ENCOUNTER — Other Ambulatory Visit: Payer: Self-pay

## 2015-07-05 NOTE — Telephone Encounter (Signed)
received a request for a 90 day supply- pt last seen on  06-20-15 next appt  08-14-15. want 90 day supply for quetiapine fumarate and sertraline hcl

## 2015-07-06 DIAGNOSIS — Z79891 Long term (current) use of opiate analgesic: Secondary | ICD-10-CM | POA: Insufficient documentation

## 2015-07-06 DIAGNOSIS — Z79899 Other long term (current) drug therapy: Secondary | ICD-10-CM | POA: Insufficient documentation

## 2015-07-06 MED ORDER — SERTRALINE HCL 100 MG PO TABS: 100.0000 mg | ORAL_TABLET | Freq: Every day | ORAL | Status: DC

## 2015-07-06 MED ORDER — QUETIAPINE FUMARATE 50 MG PO TABS: 50.0000 mg | ORAL_TABLET | Freq: Every day | ORAL | Status: DC

## 2015-07-18 ENCOUNTER — Telehealth: Payer: Self-pay

## 2015-07-18 MED ORDER — HYDROXYZINE HCL 25 MG PO TABS: 25.0000 mg | ORAL_TABLET | Freq: Two times a day (BID) | ORAL | Status: DC | PRN

## 2015-07-18 NOTE — Telephone Encounter (Signed)
pt called states that she having increase anxiety and crying. pt want to know if she can have a increase in her medication or have something else called in.   Pt last seen on 06-28-15  Next appt 08-14-15

## 2015-07-19 NOTE — Telephone Encounter (Signed)
called pt she said she did get her medication and she said that she doing good today.

## 2015-08-12 ENCOUNTER — Emergency Department: Payer: Medicare Other

## 2015-08-12 ENCOUNTER — Emergency Department
Admission: EM | Admit: 2015-08-12 | Discharge: 2015-08-12 | Disposition: A | Discharge status: Home / Self Care | Payer: Medicare Other | Attending: Emergency Medicine | Admitting: Emergency Medicine

## 2015-08-12 ENCOUNTER — Encounter: Payer: Self-pay | Admitting: Emergency Medicine

## 2015-08-12 ENCOUNTER — Other Ambulatory Visit: Payer: Self-pay

## 2015-08-12 DIAGNOSIS — Z720 Tobacco use: Secondary | ICD-10-CM | POA: Insufficient documentation

## 2015-08-12 DIAGNOSIS — J439 Emphysema, unspecified: Secondary | ICD-10-CM

## 2015-08-12 DIAGNOSIS — T8579XA Infection and inflammatory reaction due to other internal prosthetic devices, implants and grafts, initial encounter: Secondary | ICD-10-CM | POA: Insufficient documentation

## 2015-08-12 DIAGNOSIS — Z791 Long term (current) use of non-steroidal anti-inflammatories (NSAID): Secondary | ICD-10-CM | POA: Diagnosis not present

## 2015-08-12 DIAGNOSIS — R109 Unspecified abdominal pain: Secondary | ICD-10-CM

## 2015-08-12 DIAGNOSIS — R1012 Left upper quadrant pain: Secondary | ICD-10-CM | POA: Diagnosis present

## 2015-08-12 DIAGNOSIS — Z7951 Long term (current) use of inhaled steroids: Secondary | ICD-10-CM | POA: Diagnosis not present

## 2015-08-12 DIAGNOSIS — Z79899 Other long term (current) drug therapy: Secondary | ICD-10-CM | POA: Insufficient documentation

## 2015-08-12 DIAGNOSIS — L02211 Cutaneous abscess of abdominal wall: Secondary | ICD-10-CM | POA: Diagnosis not present

## 2015-08-12 HISTORY — DX: Malignant (primary) neoplasm, unspecified: C80.1

## 2015-08-12 HISTORY — DX: Essential (primary) hypertension: I10

## 2015-08-12 LAB — CBC
HCT: 36.5 % (ref 35.0–47.0)
Hemoglobin: 12.2 g/dL (ref 12.0–16.0)
MCH: 31.7 pg (ref 26.0–34.0)
MCHC: 33.4 g/dL (ref 32.0–36.0)
MCV: 94.8 fL (ref 80.0–100.0)
Platelets: 460 10*3/uL — ABNORMAL HIGH (ref 150–440)
RBC: 3.85 MIL/uL (ref 3.80–5.20)
RDW: 13.7 % (ref 11.5–14.5)
WBC: 9.2 10*3/uL (ref 3.6–11.0)

## 2015-08-12 LAB — COMPREHENSIVE METABOLIC PANEL
ALT: 11 U/L — ABNORMAL LOW (ref 14–54)
AST: 14 U/L — ABNORMAL LOW (ref 15–41)
Albumin: 3.6 g/dL (ref 3.5–5.0)
Alkaline Phosphatase: 76 U/L (ref 38–126)
Anion gap: 5 (ref 5–15)
BUN: 10 mg/dL (ref 6–20)
CO2: 30 mmol/L (ref 22–32)
Calcium: 8.9 mg/dL (ref 8.9–10.3)
Chloride: 95 mmol/L — ABNORMAL LOW (ref 101–111)
Creatinine, Ser: 0.44 mg/dL (ref 0.44–1.00)
GFR calc Af Amer: >60 mL/min (ref >60)
GFR calc non Af Amer: >60 mL/min (ref >60)
Glucose, Bld: 148 mg/dL — ABNORMAL HIGH (ref 65–99)
Potassium: 3.6 mmol/L (ref 3.5–5.1)
Sodium: 130 mmol/L — ABNORMAL LOW (ref 135–145)
Total Bilirubin: 0.5 mg/dL (ref 0.3–1.2)
Total Protein: 7.2 g/dL (ref 6.5–8.1)

## 2015-08-12 LAB — URINALYSIS COMPLETE WITH MICROSCOPIC (ARMC ONLY)
Bilirubin Urine: NEGATIVE
Glucose, UA: NEGATIVE mg/dL
Hgb urine dipstick: NEGATIVE
Ketones, ur: NEGATIVE mg/dL
Leukocytes, UA: NEGATIVE
Nitrite: NEGATIVE
Protein, ur: NEGATIVE mg/dL
Specific Gravity, Urine: 1.017 (ref 1.005–1.030)
pH: 6 (ref 5.0–8.0)

## 2015-08-12 LAB — TROPONIN I: Troponin I: <0.03 ng/mL (ref <0.031)

## 2015-08-12 LAB — LIPASE, BLOOD: Lipase: 27 U/L (ref 11–51)

## 2015-08-12 MED ORDER — SULFAMETHOXAZOLE-TRIMETHOPRIM 800-160 MG PO TABS
1.0000 | ORAL_TABLET | Freq: Once | ORAL | Status: AC
  Administered 2015-08-12: 1 via ORAL
  Filled 2015-08-12: qty 1

## 2015-08-12 MED ORDER — HYDROMORPHONE HCL 1 MG/ML IJ SOLN
0.5000 mg | Freq: Once | INTRAMUSCULAR | Status: AC
  Administered 2015-08-12: 0.5 mg via INTRAVENOUS
  Filled 2015-08-12: qty 1

## 2015-08-12 MED ORDER — SODIUM CHLORIDE 0.9 % IV SOLN
Freq: Once | INTRAVENOUS | Status: AC
  Administered 2015-08-12: 17:00:00 via INTRAVENOUS

## 2015-08-12 MED ORDER — IOHEXOL 240 MG/ML SOLN
25.0000 mL | Freq: Once | INTRAMUSCULAR | Status: AC | PRN
  Administered 2015-08-12: 25 mL via ORAL

## 2015-08-12 MED ORDER — SULFAMETHOXAZOLE-TRIMETHOPRIM 800-160 MG PO TABS: 1.0000 | ORAL_TABLET | Freq: Two times a day (BID) | ORAL | Status: DC

## 2015-08-12 MED ORDER — IOHEXOL 300 MG/ML  SOLN
100.0000 mL | Freq: Once | INTRAMUSCULAR | Status: AC | PRN
  Administered 2015-08-12: 100 mL via INTRAVENOUS

## 2015-08-12 NOTE — Discharge Instructions (Signed)
Abdominal Pain, Adult Many things can cause abdominal pain. Usually, abdominal pain is not caused by a disease and will improve without treatment. It can often be observed and treated at home. Your health care provider will do a physical exam and possibly order blood tests and X-rays to help determine the seriousness of your pain. However, in many cases, more time must pass before a clear cause of the pain can be found. Before that point, your health care provider may not know if you need more testing or further treatment. HOME CARE INSTRUCTIONS Monitor your abdominal pain for any changes. The following actions may help to alleviate any discomfort you are experiencing:  Only take over-the-counter or prescription medicines as directed by your health care provider.  Do not take laxatives unless directed to do so by your health care provider.  Try a clear liquid diet (broth, tea, or water) as directed by your health care provider. Slowly move to a bland diet as tolerated. SEEK MEDICAL CARE IF:  You have unexplained abdominal pain.  You have abdominal pain associated with nausea or diarrhea.  You have pain when you urinate or have a bowel movement.  You experience abdominal pain that wakes you in the night.  You have abdominal pain that is worsened or improved by eating food.  You have abdominal pain that is worsened with eating fatty foods.  You have a fever. SEEK IMMEDIATE MEDICAL CARE IF:  Your pain does not go away within 2 hours.  You keep throwing up (vomiting).  Your pain is felt only in portions of the abdomen, such as the right side or the left lower portion of the abdomen.  You pass bloody or black tarry stools. MAKE SURE YOU:  Understand these instructions.  Will watch your condition.  Will get help right away if you are not doing well or get worse.   This information is not intended to replace advice given to you by your health care provider. Make sure you discuss  any questions you have with your health care provider.   Document Released: 07/02/2005 Document Revised: 06/13/2015 Document Reviewed: 06/01/2013 Elsevier Interactive Patient Education 2016 [place] have an abscess in the abdominal wall in the area where the mesh is. Dr. Burt Knack our surgeon feels that is appropriate for you has to go to [facility] as you wish to get the [facility] to work on your abscess. Even though this is been going on for months please do not wait any longer than tomorrow morning to go to see them. I will give you some Bactrim sulfa antibiotic which should cover staph take 1 twice a day L give you the first dose here in the emergency room the next dose will be tomorrow morning. You have any increasing pain and redness of the abdominal wall fever vomiting or feels sicker at all and you have not been able to get to [facility] please return here immediately.

## 2015-08-12 NOTE — ED Notes (Signed)
Pt presents to ER with abdominal pain. She is being treated for pain at [facility]. "this is a pain I have never felt before". Pt describes pain as sharp localized pain.

## 2015-08-12 NOTE — ED Provider Notes (Addendum)
[facility] Emergency Department Provider Note  ____________________________________________  Time seen: Approximately 4:00 PM  I have reviewed the triage vital signs and the nursing notes.   HISTORY  Chief Complaint Abdominal Pain    HPI Shirley Alvarado is a 64 y.o. female who complains of pain in the upper abdomen just to the left of center. Patient reports started about 2 weeks ago and got much worse last week and is never gone sometimes is worse with eating sometimes not as sharp stabbing pain worse at times and never goes away completely nothing really seems to make it worse consistently although right now it got worse with deep breathing. It is currently severe she has a history of colon cancer and has some chronic pain does not want pain medicine for this even on [name] to her. She is nauseated and has not vomited so she is worried about having a heart attack   Past Medical History  Diagnosis Date  . Anxiety   . Depression   . COPD (chronic obstructive pulmonary disease) ([place])   . Cancer ([place])   . Hypertension     Patient Active Problem List   Diagnosis Date Noted  . 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 Pulmonary emphysema ([place])   . 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 Foot pain 10/12/2014  . Plantar verruca 10/12/2014  . Continuous opioid dependence ([place]) 06/09/2014  . Advance directive discussed with patient 04/11/2013  . 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 Adult BMI 30+ 11/23/2012  . Lung mass 11/23/2012  . Compulsive tobacco user syndrome 11/23/2012  . Current tobacco use 11/23/2012  . Chronic pain 04/27/2012  . Cancer of colon ([place]) 07/29/2011  . CAFL (chronic airflow limitation) ([place]) 07/29/2011  . Clinical depression 07/29/2011  . BP (high blood pressure) 07/29/2011  . Hernia of anterior abdominal wall 07/29/2011     Past Surgical History  Procedure Laterality Date  . Colon surgery      Current Outpatient Rx  Name  Route  Sig  Dispense  Refill  . albuterol (PROAIR HFA) 108 (90 BASE) MCG/ACT inhaler   Inhalation   Inhale into the lungs.         Marland Kitchen amLODipine (NORVASC) 10 MG tablet   Oral   Take by mouth.         . diazepam (VALIUM) 10 MG tablet      Take one half a tablet in the morning and one whole tablet in the evening,.   45 tablet   0     TO BE FILLED on July 13, 2015   . dicyclomine (BENTYL) 10 MG capsule      TAKE 1 CAPSULE (10 MG TOTAL) BY MOUTH 4 (FOUR) TIMES DAILY BEFORE MEALS AND NIGHTLY.         . fluticasone (FLONASE) 50 MCG/ACT nasal spray   Nasal   Place into the nose.         Marland Kitchen Fluticasone-Salmeterol (ADVAIR DISKUS) 250-50 MCG/DOSE AEPB   Inhalation   Inhale into the lungs.         . hydrOXYzine (ATARAX/VISTARIL) 25 MG tablet   Oral   Take 1 tablet (25 mg total) by mouth 2 (two) times daily as needed for anxiety.   60 tablet   1   . liothyronine (CYTOMEL) 50 MCG tablet   Oral   Take by mouth.         Marland Kitchen  loratadine (CLARITIN) 10 MG tablet   Oral   Take by mouth.         . losartan-hydrochlorothiazide (HYZAAR) 100-25 MG per tablet      TAKE 1 TABLET BY MOUTH DAILY.         . meloxicam (MOBIC) 15 MG tablet   Oral   Take by mouth.         . montelukast (SINGULAIR) 10 MG tablet      TAKE 1 TABLET BY MOUTH NIGHTLY         . nystatin cream (MYCOSTATIN)   Topical   Apply topically.         Marland Kitchen oxyCODONE (ROXICODONE) 15 MG immediate release tablet               . QUEtiapine (SEROQUEL) 50 MG tablet   Oral   Take 1 tablet (50 mg total) by mouth at bedtime.   90 tablet   0   . ranitidine (ZANTAC) 150 MG capsule   Oral   Take by mouth.         . sertraline (ZOLOFT) 100 MG tablet   Oral   Take 1 tablet (100 mg total) by mouth daily.   90 tablet   0   . sulfamethoxazole-trimethoprim (BACTRIM DS,SEPTRA DS) 800-160 MG  tablet   Oral   Take 1 tablet by mouth 2 (two) times daily.   20 tablet   0   . theophylline (UNIPHYL) 400 MG 24 hr tablet   Oral   Take by mouth.         . tiotropium (SPIRIVA HANDIHALER) 18 MCG inhalation capsule   Inhalation   Place into inhaler and inhale.           Allergies Levofloxacin  Family History  Problem Relation Age of Onset  . Lung cancer Mother   . Hypertension Mother   . Diabetes Mother   . Anxiety disorder Mother   . Depression Mother   . Hypertension Father   . Diabetes Father   . Heart attack Father   . Depression Father   . Anxiety disorder Father   . Alcohol abuse Father   . Drug abuse Father   . Cancer Sister   . Diabetes Sister   . Hypertension Sister   . Obesity Sister   . Hypertension Brother   . Hypertension Brother   . Liver disease Brother   . Colon cancer Brother     Social History Social History  Substance Use Topics  . Smoking status: Current Every Day Smoker -- 0.50 packs/day for 50 years    Types: Cigarettes    Start date: 05/21/1970  . Smokeless tobacco: Never Used     Comment: However patient relates that she smoked for 50 years and at one point she was up to 2 packs per day  . Alcohol Use: No    Review of Systems Constitutional: No fever/chills Eyes: No visual changes. ENT: No sore throat. Cardiovascular: Denies chest pain. Respiratory: Denies shortness of breath. Gastrointestinal: See history of present illness Genitourinary: Negative for dysuria. Musculoskeletal: Negative for back pain. Skin: Negative for rash. Neurological: Negative for headaches, focal weakness or numbness.  10-point ROS otherwise negative.  ____________________________________________   PHYSICAL EXAM:  VITAL SIGNS: ED Triage Vitals  Enc Vitals Group     BP 08/12/15 1335 119/46 mmHg     Pulse Rate 08/12/15 1335 87     Resp 08/12/15 1335 18     Temp 08/12/15 1335 97.4  F (36.3 C)     Temp Source 08/12/15 1335 Oral     SpO2  08/12/15 1335 96 %     Weight 08/12/15 1335 165 lb (74.844 kg)     Height 08/12/15 1335 5\' 2"  (1.575 m)     Head Cir --      Peak Flow --      Pain Score 08/12/15 1336 10     Pain Loc --      Pain Edu? --      Excl. in [place]? --     Constitutional: Alert and oriented.  Eyes: Conjunctivae are normal. PERRL. EOMI. Head: Atraumatic. Nose: No congestion/rhinnorhea. Mouth/Throat: Mucous membranes are moist.  Oropharynx non-erythematous. Neck: No stridor. Cardiovascular: Normal rate, regular rhythm. Grossly normal heart sounds.  Good peripheral circulation. Respiratory: Normal respiratory effort.  No retractions. Lungs CTAB. Gastrointestinal: Soft mildly diffusely tender but very tender in the upper abdomen just to the left of center tender to palpation tender percussion seems to be a slight swelling there perhaps under the skin is difficult to tell. No distention. No abdominal bruits. No CVA tenderness. }Musculoskeletal: No lower extremity tenderness nor edema.  No joint effusions. Neurologic:  Normal speech and language. No gross focal neurologic deficits are appreciated. No gait instability. Skin:  Skin is warm, dry and intact. No rash noted.   ____________________________________________   LABS (all labs ordered are listed, but only abnormal results are displayed)  Labs Reviewed  COMPREHENSIVE METABOLIC PANEL - Abnormal; Notable for the following:    Sodium 130 (*)    Chloride 95 (*)    Glucose, Bld 148 (*)    AST 14 (*)    ALT 11 (*)    All other components within normal limits  CBC - Abnormal; Notable for the following:    Platelets 460 (*)    All other components within normal limits  URINALYSIS COMPLETEWITH MICROSCOPIC ([facility] ONLY) - Abnormal; Notable for the following:    Color, Urine YELLOW (*)    APPearance CLEAR (*)    Bacteria, UA RARE (*)    Squamous Epithelial / LPF 0-5 (*)    All other components within normal limits  LIPASE, BLOOD  TROPONIN I    ____________________________________________  EKG  EKG read and interpreted by me shows normal sinus rhythm at a rate of 83 normal axis no acute ST-T wave changes essentially normal EKG ____________________________________________  RADIOLOGY  CT shows what appears to be an abscess in the anterior abdominal wall above the umbilicus ____________________________________________   PROCEDURES  Patient seen by Dr. Burt Knack the surgeon. He feels this since this is been going on for a month and the patient usually gets her care at [facility] she can go to [place] if she wants to to have her doctors over there work on her abscess which seems to be an infected mesh. He recommends giving her some Bactrim in the meantime and making sure she goes to see them tomorrow.  ____________________________________________   INITIAL IMPRESSION / ASSESSMENT AND PLAN / ED COURSE  Pertinent labs & imaging results that were available during my care of the patient were reviewed by me and considered in my medical decision making (see chart for details). Dr. 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 Patient does not want to go down tonight patient does not want be transferred tonight and Dr. Burt Knack feels it is okay to let her go down to [facility] tomorrow either through her doctor's office or through the ER to see surgery.  Nena Polio, MD 08/12/15 1905  ----------------------------------------- 7:53 PM on 08/12/2015 -----------------------------------------  I called [facility] in an attempt to get an appointment for the patient had Gen.  surgery the operator says that the acute care surgeons of trauma surgeons do not know how to get appointments for the patient she thinks probably would be best for her to go down to the ER tomorrow. She did have her give me the appointment phone number and the general surgery referral phone number the patient can try those but most likely would be [place] for her to go down to [facility] as she wanted to and is Dr. Burt Knack told her.  Nena Polio, MD 08/12/15 [phone]

## 2015-08-12 NOTE — Consult Note (Signed)
Surgical Consultation  08/12/2015  Shirley Alvarado is an 64 y.o. female.   CC epigastric pain  HPI: This a patient with epigastric pain and points to the area in her upper abdomen somewhat diffuse above a prior abdominal wall scar. She states that this is been present for over a month and is been worsening for very gradually. She's not sure she's had a fever in the past. She's had some nausea but no emesis and is been able to eat she's had normal bowel movements. The patient is chronically medicated with what sounds like a fentanyl patch and oral analgesics for chronic abdominal pain since her colon cancer surgery. Of significance is that she had colon cancer and developed a hernia afterwards and she had a hernia repaired with mesh that became infected and was required to be removed a second mesh was placed and that became infected and she had an open wound that was packed for several weeks. This was all done at [facility] back in 2005 and 2006. She does not wish to go back to [facility]. He had chemotherapy for her colon cancer at [facility] and is now followed by the oncologist and her primary care physician is a [facility] physician as well. She has a relationship with a [facility] physicians. She states that she had MRSA the prior to the previous time when she had the infection. She is disabled and continues to smoke in spite of her COPD Past Medical History  Diagnosis Date  . Anxiety   . Depression   . COPD (chronic obstructive pulmonary disease) (HCC)   . Cancer (HCC)   . Hypertension     Past Surgical History  Procedure Laterality Date  . Colon surgery      Family History  Problem Relation Age of Onset  . Lung cancer Mother   . Hypertension Mother   . Diabetes Mother   . Anxiety disorder Mother   . Depression Mother   . Hypertension Father   . Diabetes Father   . Heart attack Father   . Depression Father   . Anxiety disorder Father   . Alcohol abuse Father   . Drug abuse  Father   . Cancer Sister   . Diabetes Sister   . Hypertension Sister   . Obesity Sister   . Hypertension Brother   . Hypertension Brother   . Liver disease Brother   . Colon cancer Brother     Social History:  reports that she has been smoking Cigarettes.  She started smoking about 45 years ago. She has a 25 pack-year smoking history. She has never used smokeless tobacco. She reports that she does not drink alcohol or use illicit drugs.  Allergies:  Allergies  Allergen Reactions  . Levofloxacin Hives    Medications reviewed.   Review of Systems:   Review of Systems  Constitutional: Positive for fever and malaise/fatigue. Negative for chills, weight loss and diaphoresis.  HENT: Negative.   Eyes: Negative.   Respiratory: Positive for cough, shortness of breath and wheezing. Negative for hemoptysis and sputum production.        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  Gastrointestinal: Positive for nausea and abdominal pain. Negative for heartburn, vomiting, diarrhea, constipation, blood in stool and melena.  Genitourinary: Negative.   Musculoskeletal: Negative.   Skin: Negative.   Neurological: Positive for weakness.  Endo/Heme/Allergies: Negative.  Psychiatric/Behavioral: Negative.      Physical Exam:  BP 156/71 mmHg  Pulse 85  Temp(Src) 97.4 F (36.3 C) (Oral)  Resp 15  Ht 5\' 2"  (1.575 m)  Wt 165 lb (74.844 kg)  BMI 30.17 kg/m2  SpO2 97%  Physical Exam  Constitutional: She is oriented to person, place, and time and well-developed, well-nourished, and in no distress. No distress.  Obese wearing oxygen  HENT:  Head: Normocephalic and atraumatic.  Eyes: Pupils are equal, round, and reactive to light. Right eye exhibits no discharge. Left eye exhibits no discharge. No scleral icterus.  Neck: Normal range of motion. Neck supple. No JVD present.   Cardiovascular: Normal rate, regular rhythm and normal heart sounds.   Pulmonary/Chest: Effort normal. No respiratory distress. She has wheezes. She has no rales.  Abdominal: Soft. She exhibits mass. She exhibits no distension. There is tenderness. There is no rebound and no guarding.  Supraumbilical mass effect complex healed scar in the umbilical area tenderness in this area supraumbilically into the epigastrium without peritoneal signs and no overlying erythema no fluctuance  Musculoskeletal: She exhibits edema. She exhibits no tenderness.  Lymphadenopathy:    She has no cervical adenopathy.  Neurological: She is alert and oriented to person, place, and time.  Skin: Skin is warm and dry. No rash noted. No erythema.  Psychiatric: Mood and affect normal.      Results for orders placed or performed during the hospital encounter of 08/12/15 (from the past 48 hour(s))  Lipase, blood     Status: None   Collection Time: 08/12/15  1:50 PM  Result Value Ref Range   Lipase 27 11 - 51 U/L  Comprehensive metabolic panel     Status: Abnormal   Collection Time: 08/12/15  1:50 PM  Result Value Ref Range   Sodium 130 (L) 135 - 145 mmol/L   Potassium 3.6 3.5 - 5.1 mmol/L   Chloride 95 (L) 101 - 111 mmol/L   CO2 30 22 - 32 mmol/L   Glucose, Bld 148 (H) 65 - 99 mg/dL   BUN 10 6 - 20 mg/dL   Creatinine, Ser 13/06/16 0.44 - 1.00 mg/dL   Calcium 8.9 8.9 - 3.22 mg/dL   Total Protein 7.2 6.5 - 8.1 g/dL   Albumin 3.6 3.5 - 5.0 g/dL   AST 14 (L) 15 - 41 U/L   ALT 11 (L) 14 - 54 U/L   Alkaline Phosphatase 76 38 - 126 U/L   Total Bilirubin 0.5 0.3 - 1.2 mg/dL   GFR calc non Af Amer >60 >60 mL/min   GFR calc Af Amer >60 >60 mL/min    Comment: (NOTE) The eGFR has been calculated using the CKD EPI equation. This calculation has not been validated in all clinical situations. eGFR's persistently <60 mL/min signify possible Chronic Kidney Disease.    Anion gap 5 5 - 15  CBC     Status: Abnormal    Collection Time: 08/12/15  1:50 PM  Result Value Ref Range   WBC 9.2 3.6 - 11.0 K/uL   RBC 3.85 3.80 - 5.20 MIL/uL   Hemoglobin 12.2 12.0 - 16.0 g/dL   HCT 13/06/16 76.6 - 59.8 %   MCV 94.8 80.0 - 100.0 fL   MCH 31.7 26.0 - 34.0 pg   MCHC 33.4 32.0 - 36.0 g/dL   RDW 54.9 37.8 - 79.2 %   Platelets 460 (H) 150 - 440 K/uL  Troponin I     Status: None  Collection Time: 08/12/15  1:50 PM  Result Value Ref Range   Troponin I <0.03 <0.031 ng/mL    Comment:        NO INDICATION OF MYOCARDIAL INJURY.   Urinalysis complete, with microscopic ([facility] only)     Status: Abnormal   Collection Time: 08/12/15  3:28 PM  Result Value Ref Range   Color, Urine YELLOW (A) YELLOW   APPearance CLEAR (A) CLEAR   Glucose, UA NEGATIVE NEGATIVE mg/dL   Bilirubin Urine NEGATIVE NEGATIVE   Ketones, ur NEGATIVE NEGATIVE mg/dL   Specific Gravity, Urine 1.017 1.005 - 1.030   Hgb urine dipstick NEGATIVE NEGATIVE   pH 6.0 5.0 - 8.0   Protein, ur NEGATIVE NEGATIVE mg/dL   Nitrite NEGATIVE NEGATIVE   Leukocytes, UA NEGATIVE NEGATIVE   RBC / HPF 0-5 0 - 5 RBC/hpf   WBC, UA 0-5 0 - 5 WBC/hpf   Bacteria, UA RARE (A) NONE SEEN   Squamous Epithelial / LPF 0-5 (A) NONE SEEN   Mucous PRESENT    Ct Abdomen Pelvis W Contrast  08/12/2015  CLINICAL DATA:  Patient with worsening abdominal pain when she eats. Nausea. History of colon resection in 2005 for colon cancer. EXAM: CT ABDOMEN AND PELVIS WITH CONTRAST TECHNIQUE: Multidetector CT imaging of the abdomen and pelvis was performed using the standard protocol following bolus administration of intravenous contrast. CONTRAST:  121mL OMNIPAQUE IOHEXOL 300 MG/ML  SOLN COMPARISON:  Chest CT 01/04/2014 FINDINGS: Lower chest: Normal heart size. Lung bases are clear. No pleural effusion. Hepatobiliary: Liver is normal in size and contour. No focal hepatic lesion is identified. Gallbladder is decompressed. No intrahepatic or extrahepatic biliary ductal dilatation. Pancreas: Unremarkable  Spleen: Unremarkable Adrenals/Urinary Tract: The adrenal glands are normal. There is a 5 mm stone within the superior pole of the right kidney an additional 3 mm stone within the inferior pole of the right kidney. No hydroureteronephrosis. There is mild wall thickening of the anterior aspect of the urinary bladder. No surrounding fat stranding. Stomach/Bowel: Sigmoid colonic diverticulosis. No CT evidence for acute diverticulitis. Patient status post right hemicolectomy. Anastomosis appears intact. No evidence for small bowel obstruction. Oral contrast material is present to the level of the enteric colonic anastomosis. No free intraperitoneal air. Vascular/Lymphatic: Normal caliber abdominal aorta. Extensive peripheral calcified atherosclerotic plaque. No retroperitoneal lymphadenopathy. Other: The uterus is unremarkable. Within the anterior abdominal wall there is a supraumbilical fat containing hernia. Additionally at the level of this hernia there is a 4.2 x 1.9 cm rim enhancing thick walled fluid collection (image 25; series 2). Musculoskeletal: No aggressive or acute appearing osseous lesions. Lumbar spine degenerative changes. IMPRESSION: There is a 4.2 cm thick walled rim enhancing fluid collection within the supraumbilical anterior abdominal wall, most compatible with abscess. There is adjacent fat containing ventral abdominal wall hernia. The anterior colonic anastomosis appears intact. No evidence for bowel obstruction. There is wall thickening of the anterior aspect of the urinary bladder which may be secondary to outlet obstruction. Recommend correlation with urinalysis to exclude the possibility of infection. Extensive calcified atherosclerotic plaque involving the abdominal aorta. Nonobstructing right-sided nephrolithiasis. These results were called by telephone at the time of interpretation on 08/12/2015 at 5:23 pm to Dr. Conni Slipper , who verbally acknowledged these results. Electronically Signed    By: Lovey Newcomer M.D.   On: 08/12/2015 17:27    Assessment/Plan:  This a patient with oxygen-dependent COPD who continues to smoke. She had colon cancer and a complex ventral hernia repaired  at [facility] 10 years ago. She had mesh placed and the mesh became infected she then had that mesh removed and a new mesh placed which became infected and was treated with external drainage and secondary intention closure. She states that she had MRSA at that time. 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 That mesh is likely infected this point she was told previously that she could not have the mesh removed because it was attached to the intestines. She is frightened of these statements and the condition that she is an. Discussed with her tertiary referral and since she has a relationship with [place] and does not want to go back to [facility] she has opted to travel to [place]. Because this is not emergent and has been going on for a month I see no reason for her to be transferred to the emergency room tonight. I did discuss with Dr. Rip Harbour that she should be placed on oral antibiotics and because she's had MRSA that Bactrim would be a good choice. He was in agreement with this plan. Patient and family agreed to be discharged tonight to follow-up with the [facility] emergency room tomorrow morning. I urged them to follow-up with the emergency room at [facility] rather than waiting for an appointment with the surgeon as that may take weeks if not months. I told them using T Word mesh infection when they go to the emergency room and to take all of the emergency room information generated tonight with them. Over 80 minutes were spent with this patient and review and discussion with the patient and physicians.  Florene Glen, MD, FACS

## 2015-08-12 NOTE — ED Notes (Signed)
Pt has upper abdominal pain just below her left breast. Pt currently taking pain medicine for previous hernia and "does not want pain medication". Upon assessment the pain has rebound tenderness to her upper abdomin with guarding.

## 2015-08-14 ENCOUNTER — Ambulatory Visit: Payer: Medicare Other | Admitting: Psychiatry

## 2015-08-22 ENCOUNTER — Telehealth: Payer: Self-pay | Admitting: Psychiatry

## 2015-08-22 MED ORDER — DIAZEPAM 10 MG PO TABS: ORAL_TABLET | ORAL | Status: DC

## 2015-08-22 NOTE — Telephone Encounter (Signed)
spoke with pharmacy- pt last received the zoloft on 07-03-15 and it was for a 30 day supply,  the seroquel was last done on  08-01-15 for a 90 day supply pt should have enough until jan.  and the valium was last filled on  07-14-15.

## 2015-08-22 NOTE — Telephone Encounter (Signed)
spoke with patient,  pt was told that rx for valium was faxed in and confirmed.  but that she should have enough seroquel to do until jan.   and then i question her about the zoloft that the pharmacy states that the last time she picked up zoloft was 07-03-15 and that means she has not had any for 2 1/2 weeks.  Pt states that she had some left over and that she was using those.

## 2015-08-22 NOTE — Telephone Encounter (Signed)
faxed rx - confirmed for the valium id# [id number] order # ID:[id number]

## 2015-09-05 ENCOUNTER — Encounter: Payer: Self-pay | Admitting: Psychiatry

## 2015-09-05 ENCOUNTER — Ambulatory Visit (INDEPENDENT_AMBULATORY_CARE_PROVIDER_SITE_OTHER): Payer: 59 | Admitting: Psychiatry

## 2015-09-05 VITALS — BP 124/68 | HR 95 | Temp 97.1°F | Ht 62.0 in | Wt 161.0 lb

## 2015-09-05 DIAGNOSIS — F411 Generalized anxiety disorder: Secondary | ICD-10-CM | POA: Diagnosis not present

## 2015-09-05 DIAGNOSIS — F331 Major depressive disorder, recurrent, moderate: Secondary | ICD-10-CM | POA: Diagnosis not present

## 2015-09-05 MED ORDER — SERTRALINE HCL 100 MG PO TABS: 150.0000 mg | ORAL_TABLET | Freq: Every day | ORAL | Status: DC

## 2015-09-05 MED ORDER — QUETIAPINE FUMARATE 50 MG PO TABS: 50.0000 mg | ORAL_TABLET | Freq: Every day | ORAL | Status: DC

## 2015-09-05 MED ORDER — DIAZEPAM 10 MG PO TABS: ORAL_TABLET | ORAL | Status: DC

## 2015-09-05 NOTE — Progress Notes (Signed)
BH MD/PA/NP OP Progress Note  09/05/2015 1:42 PM Shirley Alvarado  MRN:  [id number]  Subjective:  Patient returns for follow-up of her depression. She is now been taking sertraline 100 mg daily. Patient continues to report that her depression has improved. She states she is able to enjoy Thanksgiving and in particular enjoy her grandchildren. She looks for to the holidays and seeing the grandchildren again at that time.  She states she was in the hospital earlier this month for 3 days for treatment of MRSA. She states that her home health aide as well as the doctors during that hospitalization wanted her to be on more Valium. She indicated she is following my recommendations to take the lower dose that she has now been on.  I discussed that ideally it would be best for her to be on less Valium but that we would try to address her anxiety by perhaps a higher dose of sertraline before we continue to taper her off of it. Chief Complaint: better Chief Complaint    Follow-up; Medication Refill     Visit Diagnosis:  No diagnosis found.  Past Medical History:  Past Medical History  Diagnosis Date  . Anxiety   . Depression   . COPD (chronic obstructive pulmonary disease) ([place])   . Cancer ([place])   . Hypertension     Past Surgical History  Procedure Laterality Date  . Colon surgery     Family History:  Family History  Problem Relation Age of Onset  . Lung cancer Mother   . Hypertension Mother   . Diabetes Mother   . Anxiety disorder Mother   . Depression Mother   . Hypertension Father   . Diabetes Father   . Heart attack Father   . Depression Father   . Anxiety disorder Father   . Alcohol abuse Father   . Drug abuse Father   . Cancer Sister   . Diabetes Sister   . Hypertension Sister   . Obesity Sister   . Hypertension Brother   . Hypertension Brother   . Liver disease Brother   . Colon cancer Brother    Social History:  Social History   Social History  . Marital Status:  Widowed    Spouse Name: N/A  . Number of Children: N/A  . Years of Education: N/A   Social History Main Topics  . Smoking status: Current Every Day Smoker -- 0.50 packs/day for 50 years    Types: Cigarettes    Start date: 05/21/1970  . Smokeless tobacco: Never Used     Comment: However patient relates that she smoked for 50 years and at one point she was up to 2 packs per day  . Alcohol Use: No  . Drug Use: No  . Sexual Activity: No   Other Topics Concern  . None   Social History Narrative   Additional History:   Assessment:   Musculoskeletal: Strength & Muscle Tone: within normal limits Gait & Station: Slow  Patient leans: N/A  Psychiatric Specialty Exam: Anxiety Symptoms include nervous/anxious behavior. Patient reports no insomnia or suicidal ideas.      Review of Systems  Psychiatric/Behavioral: Negative for depression, suicidal ideas, hallucinations, memory loss and substance abuse. The patient is nervous/anxious. The patient does not have insomnia.   All other systems reviewed and are negative.   Blood pressure 124/68, pulse 95, temperature 97.1 F (36.2 C), temperature source Tympanic, height 5\' 2"  (1.575 m), weight 161 lb (73.029 kg), SpO2 91 %.  Body mass index is 29.44 kg/(m^2).  General Appearance: Fairly Groomed  Eye Contact:  Good  Speech:  Normal Rate  Volume:  Normal  Mood:  better  Affect:  Brighter, not tearful as in last visit  Thought Process:  Linear  Orientation:  Full (Time, Place, and Person)  Thought Content:  Negative  Suicidal Thoughts:  No  Homicidal Thoughts:  No  Memory:  Immediate;   Good Recent;   Good Remote;   Good  Judgement:  Good  Insight:  Good  Psychomotor Activity:  Negative  Concentration:  Good  Recall:  Good  Fund of Knowledge: Good  Language: Good  Akathisia:  Negative  Handed:  Right unknown   AIMS (if indicated):  N/A  Assets:  Communication Skills Desire for Improvement  ADL's:  Intact  Cognition: WNL   Sleep:  good   Is the patient at risk to self?  No. Has the patient been a risk to self in the past 6 months?  No. Has the patient been a risk to self within the distant past?  No. Is the patient a risk to others?  No. Has the patient been a risk to others in the past 6 months?  No. Has the patient been a risk to others within the distant past?  No.  Current Medications: Current Outpatient Prescriptions  Medication Sig Dispense Refill  . albuterol (PROAIR HFA) 108 (90 BASE) MCG/ACT inhaler Inhale into the lungs.    Marland Kitchen amLODipine (NORVASC) 10 MG tablet Take by mouth.    . diazepam (VALIUM) 10 MG tablet Take one half a tablet in the morning and one whole tablet in the evening,. 45 tablet 0  . dicyclomine (BENTYL) 10 MG capsule TAKE 1 CAPSULE (10 MG TOTAL) BY MOUTH 4 (FOUR) TIMES DAILY BEFORE MEALS AND NIGHTLY.    . fluticasone (FLONASE) 50 MCG/ACT nasal spray Place into the nose.    Marland Kitchen Fluticasone-Salmeterol (ADVAIR DISKUS) 250-50 MCG/DOSE AEPB Inhale into the lungs.    . hydrOXYzine (ATARAX/VISTARIL) 25 MG tablet Take 1 tablet (25 mg total) by mouth 2 (two) times daily as needed for anxiety. 60 tablet 1  . liothyronine (CYTOMEL) 50 MCG tablet Take by mouth.    . loratadine (CLARITIN) 10 MG tablet Take by mouth.    . losartan-hydrochlorothiazide (HYZAAR) 100-25 MG per tablet TAKE 1 TABLET BY MOUTH DAILY.    . montelukast (SINGULAIR) 10 MG tablet TAKE 1 TABLET BY MOUTH NIGHTLY    . nystatin cream (MYCOSTATIN) Apply topically.    Marland Kitchen oxyCODONE (ROXICODONE) 15 MG immediate release tablet     . QUEtiapine (SEROQUEL) 50 MG tablet Take 1 tablet (50 mg total) by mouth at bedtime. 90 tablet 0  . ranitidine (ZANTAC) 150 MG capsule Take by mouth.    . sertraline (ZOLOFT) 100 MG tablet Take 1 tablet (100 mg total) by mouth daily. 90 tablet 0  . sulfamethoxazole-trimethoprim (BACTRIM DS,SEPTRA DS) 800-160 MG tablet Take 1 tablet by mouth 2 (two) times daily. 20 tablet 0  . theophylline (UNIPHYL) 400 MG  24 hr tablet Take by mouth.    . tiotropium (SPIRIVA HANDIHALER) 18 MCG inhalation capsule Place into inhaler and inhale.     No current facility-administered medications for this visit.    Medical Decision Making:  Established Problem, Stable/Improving (1) and Review of New Medication or Change in Dosage (2)  Treatment Plan Summary:Medication management and Plan   Continue  sertraline to 100 mg daily and Seroquel 50 mg at bedtime for  mood stabilization and augmentation for depression.   Major depression-increase sertraline from 100 mg to 150 mg. Continue Seroquel at 50 mg at bedtime.  Anxiety-sertraline as above. Will continue her Valium 5 mg in the morning and 10 mg at night. Ideally at the next visit we may decrease to 5 mg twice a day.  Faith Rogue 09/05/2015, 1:42 PM

## 2015-09-06 ENCOUNTER — Other Ambulatory Visit: Payer: Self-pay

## 2015-09-06 MED ORDER — HYDROXYZINE HCL 25 MG PO TABS: 25.0000 mg | ORAL_TABLET | Freq: Two times a day (BID) | ORAL | Status: DC | PRN

## 2015-09-06 NOTE — Telephone Encounter (Signed)
spoke with patient , left her know that the medication was sent to the pharmacy.

## 2015-09-06 NOTE — Telephone Encounter (Signed)
pt wants to get a 90 day supply of hydroxyzine. pt will not have enough to last until her next appt.

## 2015-09-17 ENCOUNTER — Telehealth (HOSPITAL_COMMUNITY): Payer: Self-pay

## 2015-09-17 NOTE — Telephone Encounter (Signed)
pt states that she is not sleeping / hard to go to sleep and she also feels sick to her stomach, nausea.  She wants to know if it could be the zoloft or the seroquel that doing it.  pt states that the zoloft was increase to 150mg 

## 2015-09-21 NOTE — Telephone Encounter (Signed)
patient states that she was taking 150mg  of zolfot and is doing better sleeping better,

## 2015-09-24 NOTE — Telephone Encounter (Signed)
Patient is taking 150 mg of Zoloft which was the plan at her last appointment. Per patient's last message she is doing well. AW

## 2015-09-24 NOTE — Telephone Encounter (Signed)
spoke with patient explained that rx for the 100mg   take 1 and 1/2  tablets daily was put on hold but that she should have it ready at the pharmacy.   Pt had been taking just the 100mg  once daily.

## 2015-09-24 NOTE — Telephone Encounter (Signed)
called and the pharmacist states that last rx was for 100mg  once daily.  they states that they have a 100mg  take 1 and .5 on hold.  they were instructed to go ahead and fill the rx.

## 2015-10-12 ENCOUNTER — Ambulatory Visit (INDEPENDENT_AMBULATORY_CARE_PROVIDER_SITE_OTHER): Payer: 59 | Admitting: Psychiatry

## 2015-10-12 ENCOUNTER — Encounter: Payer: Self-pay | Admitting: Psychiatry

## 2015-10-12 VITALS — BP 128/78 | HR 87 | Temp 97.6°F | Ht 62.0 in | Wt 159.8 lb

## 2015-10-12 DIAGNOSIS — F411 Generalized anxiety disorder: Secondary | ICD-10-CM | POA: Diagnosis not present

## 2015-10-12 DIAGNOSIS — F331 Major depressive disorder, recurrent, moderate: Secondary | ICD-10-CM | POA: Diagnosis not present

## 2015-10-12 MED ORDER — QUETIAPINE FUMARATE 50 MG PO TABS: 50.0000 mg | ORAL_TABLET | Freq: Every day | ORAL | Status: DC

## 2015-10-12 MED ORDER — SERTRALINE HCL 100 MG PO TABS: 150.0000 mg | ORAL_TABLET | Freq: Every day | ORAL | Status: DC

## 2015-10-12 MED ORDER — DIAZEPAM 10 MG PO TABS: ORAL_TABLET | ORAL | Status: DC

## 2015-10-12 NOTE — Patient Instructions (Signed)
DISCONTINUE VISTARIL/HYDROXYZINE

## 2015-10-12 NOTE — Progress Notes (Signed)
[facility] MD/PA/NP OP Progress Note  10/12/2015 11:30 AM Shirley Alvarado  MRN:  [id number]  Subjective:  Patient returns for follow-up of her depression. She states things have been about the same and there no better and no worse. We have had some issues with her pharmacy as at the last visit I did increase her sertraline from 100 mg to 150 mg daily. However I wrote instructions of the former she should hold the prescription until the patient call for refill. However patient states harms he has not released a new prescription at the higher dose despite our clinic contacting them instructing them to do so.  Date that she stated spent time with relatives over the holidays and she was able to enjoy the relatives. She does state that holidays did bring back some depressing thoughts of her late husband. She states that the medications have been working well with the exception of the Vistaril. She states it's been causing her have GI upset and she requests to discontinue it.  Continued to discuss that need to continue to taper her off the Valium. She is very much waited to remaining on this medication. However with me departing the clinic I do not feel this would be the best thing to continue the taper at this time.  Chief Complaint: same Chief Complaint    Follow-up; Medication Refill     Visit Diagnosis:  No diagnosis found.  Past Medical History:  Past Medical History  Diagnosis Date  . Anxiety   . Depression   . COPD (chronic obstructive pulmonary disease) ([place])   . Cancer ([place])   . Hypertension     Past Surgical History  Procedure Laterality Date  . Colon surgery     Family History:  Family History  Problem Relation Age of Onset  . Lung cancer Mother   . Hypertension Mother   . Diabetes Mother   . Anxiety disorder Mother   . Depression Mother   . Hypertension Father   . Diabetes Father   . Heart attack Father   . Depression Father   . Anxiety disorder Father   . Alcohol abuse Father    . Drug abuse Father   . Cancer Sister   . Diabetes Sister   . Hypertension Sister   . Obesity Sister   . Hypertension Brother   . Hypertension Brother   . Liver disease Brother   . Colon cancer Brother    Social History:  Social History   Social History  . Marital Status: Widowed    Spouse Name: N/A  . Number of Children: N/A  . Years of Education: N/A   Social History Main Topics  . Smoking status: Current Every Day Smoker -- 0.50 packs/day for 50 years    Types: Cigarettes    Start date: 05/21/1970  . Smokeless tobacco: Never Used     Comment: However patient relates that she smoked for 50 years and at one point she was up to 2 packs per day  . Alcohol Use: No  . Drug Use: No  . Sexual Activity: No   Other Topics Concern  . None   Social History Narrative   Additional History:   Assessment:   Musculoskeletal: Strength & Muscle Tone: within normal limits Gait & Station: Slow  Patient leans: N/A  Psychiatric Specialty Exam: Anxiety Symptoms include nervous/anxious behavior. Patient reports no insomnia or suicidal ideas.      Review of Systems  Psychiatric/Behavioral: Negative for depression, suicidal ideas, hallucinations,  memory loss and substance abuse. The patient is nervous/anxious. The patient does not have insomnia.   All other systems reviewed and are negative.   Blood pressure 128/78, pulse 87, temperature 97.6 F (36.4 C), temperature source Tympanic, height 5\' 2"  (1.575 m), weight 159 lb 12.8 oz (72.485 kg), SpO2 91 %.Body mass index is 29.22 kg/(m^2).  General Appearance: Fairly Groomed  Eye Contact:  Good  Speech:  Normal Rate  Volume:  Normal  Mood:  better  Affect:  Brighter, not tearful as in last visit  Thought Process:  Linear  Orientation:  Full (Time, Place, and Person)  Thought Content:  Negative  Suicidal Thoughts:  No  Homicidal Thoughts:  No  Memory:  Immediate;   Good Recent;   Good Remote;   Good  Judgement:  Good   Insight:  Good  Psychomotor Activity:  Negative  Concentration:  Good  Recall:  Good  Fund of Knowledge: Good  Language: Good  Akathisia:  Negative  Handed:  Right unknown   AIMS (if indicated):  N/A  Assets:  Communication Skills Desire for Improvement  ADL's:  Intact  Cognition: WNL  Sleep:  good   Is the patient at risk to self?  No. Has the patient been a risk to self in the past 6 months?  No. Has the patient been a risk to self within the distant past?  No. Is the patient a risk to others?  No. Has the patient been a risk to others in the past 6 months?  No. Has the patient been a risk to others within the distant past?  No.  Current Medications: Current Outpatient Prescriptions  Medication Sig Dispense Refill  . albuterol (PROAIR HFA) 108 (90 BASE) MCG/ACT inhaler Inhale into the lungs.    Marland Kitchen amLODipine (NORVASC) 10 MG tablet Take by mouth.    . diazepam (VALIUM) 10 MG tablet Take one half a tablet in the morning and one whole tablet in the evening,. 45 tablet 4  . dicyclomine (BENTYL) 10 MG capsule TAKE 1 CAPSULE (10 MG TOTAL) BY MOUTH 4 (FOUR) TIMES DAILY BEFORE MEALS AND NIGHTLY.    . fluticasone (FLONASE) 50 MCG/ACT nasal spray Place into the nose.    Marland Kitchen Fluticasone-Salmeterol (ADVAIR DISKUS) 250-50 MCG/DOSE AEPB Inhale into the lungs.    Marland Kitchen liothyronine (CYTOMEL) 50 MCG tablet Take by mouth.    . loratadine (CLARITIN) 10 MG tablet Take by mouth.    . losartan-hydrochlorothiazide (HYZAAR) 100-25 MG per tablet TAKE 1 TABLET BY MOUTH DAILY.    . meloxicam (MOBIC) 15 MG tablet Take 15 mg by mouth. Take 1.5 tablets    . montelukast (SINGULAIR) 10 MG tablet TAKE 1 TABLET BY MOUTH NIGHTLY    . nystatin cream (MYCOSTATIN) Apply topically.    Marland Kitchen oxyCODONE (ROXICODONE) 15 MG immediate release tablet     . QUEtiapine (SEROQUEL) 50 MG tablet Take 1 tablet (50 mg total) by mouth at bedtime. 90 tablet 1  . ranitidine (ZANTAC) 150 MG capsule Take by mouth.    . sertraline (ZOLOFT)  100 MG tablet Take 1.5 tablets (150 mg total) by mouth daily. 135 tablet 1  . sulfamethoxazole-trimethoprim (BACTRIM DS,SEPTRA DS) 800-160 MG tablet Take 1 tablet by mouth 2 (two) times daily. 20 tablet 0  . theophylline (UNIPHYL) 400 MG 24 hr tablet Take by mouth.    . tiotropium (SPIRIVA HANDIHALER) 18 MCG inhalation capsule Place into inhaler and inhale.     No current facility-administered medications for this visit.  Medical Decision Making:  Established Problem, Stable/Improving (1) and Review of New Medication or Change in Dosage (2)  Treatment Plan Summary:Medication management and Plan   Continue  sertraline to 100 mg daily and Seroquel 50 mg at bedtime for mood stabilization and augmentation for depression.   Major depression-increase sertraline from 100 mg to 150 mg. Continue Seroquel at 50 mg at bedtime.  Anxiety-sertraline as above. Will continue her Valium 5 mg in the morning and 10 mg at night. Ideally at the next visit we may decrease to 5 mg twice a day. She had received a prescription for Vistaril but now as noted above she reports GI upset with it and thus we'll discontinue it.  Patient is aware I will be departing to clinic next month and that she will be able to follow up with another provider within this clinic. She's been instructed to call any questions or concerns prior to her next appointment. We'll follow-up in 3 months.  Faith Rogue 10/12/2015, 11:30 AM

## 2015-11-06 ENCOUNTER — Telehealth: Payer: Self-pay | Admitting: Psychiatry

## 2015-11-14 ENCOUNTER — Encounter: Payer: Self-pay | Admitting: Psychiatry

## 2015-11-14 ENCOUNTER — Ambulatory Visit (INDEPENDENT_AMBULATORY_CARE_PROVIDER_SITE_OTHER): Payer: 59 | Admitting: Psychiatry

## 2015-11-14 VITALS — BP 132/82 | HR 95 | Temp 97.7°F | Ht 62.0 in | Wt 159.8 lb

## 2015-11-14 DIAGNOSIS — F411 Generalized anxiety disorder: Secondary | ICD-10-CM

## 2015-11-14 DIAGNOSIS — F331 Major depressive disorder, recurrent, moderate: Secondary | ICD-10-CM

## 2015-11-14 NOTE — Progress Notes (Addendum)
[facility] MD/PA/NP OP Progress Note  11/14/2015 1:09 PM Shirley Alvarado  MRN:  [id number]  Subjective:  Patient returns for follow-up of her depression. He was brought in for this earlier than her originally scheduled appointment because she called the office stating that her pulmonary doctor felt that Zoloft was interfering with other medications. I reviewed the most recent [facility] pulmonology note. The note seen to have a notation that the patient needed to stop smoking and then noted Chantix or Wellbutrin. Unclear as to whether the pulmonologist really felt the Zoloft was interfering with other things as opposed to just wanting her on a smoking cessation medication. Patient signed a release and I'm going to clarify this and then we can further assess whether to discontinue Zoloft if it is causing interference with medications or simply add Wellbutrin to address smoking cessation. Chief Complaint: same Chief Complaint    Follow-up; Medication Refill; Medication Problem     Visit Diagnosis:  No diagnosis found.  Past Medical History:  Past Medical History  Diagnosis Date  . Anxiety   . Depression   . COPD (chronic obstructive pulmonary disease) ([place])   . Cancer ([place])   . Hypertension     Past Surgical History  Procedure Laterality Date  . Colon surgery     Family History:  Family History  Problem Relation Age of Onset  . Lung cancer Mother   . Hypertension Mother   . Diabetes Mother   . Anxiety disorder Mother   . Depression Mother   . Hypertension Father   . Diabetes Father   . Heart attack Father   . Depression Father   . Anxiety disorder Father   . Alcohol abuse Father   . Drug abuse Father   . Cancer Sister   . Diabetes Sister   . Hypertension Sister   . Obesity Sister   . Hypertension Brother   . Hypertension Brother   . Liver disease Brother   . Colon cancer Brother    Social History:  Social History   Social History  . Marital Status: Widowed    Spouse Name: N/A  .  Number of Children: N/A  . Years of Education: N/A   Social History Main Topics  . Smoking status: Current Every Day Smoker -- 0.50 packs/day for 50 years    Types: Cigarettes    Start date: 05/21/1970  . Smokeless tobacco: Never Used     Comment: However patient relates that she smoked for 50 years and at one point she was up to 2 packs per day  . Alcohol Use: No  . Drug Use: No  . Sexual Activity: No   Other Topics Concern  . None   Social History Narrative   Additional History:   Assessment:   Musculoskeletal: Strength & Muscle Tone: within normal limits Gait & Station: Slow  Patient leans: N/A  Psychiatric Specialty Exam: Anxiety Symptoms include nervous/anxious behavior. Patient reports no insomnia or suicidal ideas.      Review of Systems  Psychiatric/Behavioral: Negative for depression, suicidal ideas, hallucinations, memory loss and substance abuse. The patient is nervous/anxious. The patient does not have insomnia.   All other systems reviewed and are negative.   Blood pressure 132/82, pulse 95, temperature 97.7 F (36.5 C), temperature source Tympanic, height 5\' 2"  (1.575 m), weight 159 lb 12.8 oz (72.485 kg), SpO2 90 %.Body mass index is 29.22 kg/(m^2).  General Appearance: Fairly Groomed  Eye Contact:  Good  Speech:  Normal Rate  Volume:  Normal  Mood:  better  Affect:  Brighter, not tearful as in last visit  Thought Process:  Linear  Orientation:  Full (Time, Place, and Person)  Thought Content:  Negative  Suicidal Thoughts:  No  Homicidal Thoughts:  No  Memory:  Immediate;   Good Recent;   Good Remote;   Good  Judgement:  Good  Insight:  Good  Psychomotor Activity:  Negative  Concentration:  Good  Recall:  Good  Fund of Knowledge: Good  Language: Good  Akathisia:  Negative  Handed:  Right unknown   AIMS (if indicated):  N/A  Assets:  Communication Skills Desire for Improvement  ADL's:  Intact  Cognition: WNL  Sleep:  good   Is the  patient at risk to self?  No. Has the patient been a risk to self in the past 6 months?  No. Has the patient been a risk to self within the distant past?  No. Is the patient a risk to others?  No. Has the patient been a risk to others in the past 6 months?  No. Has the patient been a risk to others within the distant past?  No.  Current Medications: Current Outpatient Prescriptions  Medication Sig Dispense Refill  . albuterol (PROAIR HFA) 108 (90 BASE) MCG/ACT inhaler Inhale into the lungs.    Marland Kitchen amLODipine (NORVASC) 10 MG tablet Take by mouth.    . diazepam (VALIUM) 10 MG tablet Take one half a tablet in the morning and one whole tablet in the evening,. 45 tablet 4  . dicyclomine (BENTYL) 10 MG capsule TAKE 1 CAPSULE (10 MG TOTAL) BY MOUTH 4 (FOUR) TIMES DAILY BEFORE MEALS AND NIGHTLY.    Derrill Memo ON 11/26/2015] fentaNYL (DURAGESIC - DOSED MCG/HR) 25 MCG/HR patch Place onto the skin.    . fluticasone (FLONASE) 50 MCG/ACT nasal spray Place into the nose.    Marland Kitchen Fluticasone-Salmeterol (ADVAIR DISKUS) 250-50 MCG/DOSE AEPB Inhale into the lungs.    Marland Kitchen liothyronine (CYTOMEL) 50 MCG tablet Take by mouth.    . loratadine (CLARITIN) 10 MG tablet Take by mouth.    . losartan-hydrochlorothiazide (HYZAAR) 100-25 MG per tablet TAKE 1 TABLET BY MOUTH DAILY.    . meloxicam (MOBIC) 15 MG tablet Take 15 mg by mouth. Take 1.5 tablets    . montelukast (SINGULAIR) 10 MG tablet TAKE 1 TABLET BY MOUTH NIGHTLY    . nystatin cream (MYCOSTATIN) Apply topically.    Marland Kitchen oxyCODONE (ROXICODONE) 15 MG immediate release tablet     . QUEtiapine (SEROQUEL) 50 MG tablet Take 1 tablet (50 mg total) by mouth at bedtime. 90 tablet 1  . ranitidine (ZANTAC) 150 MG capsule Take by mouth.    . sertraline (ZOLOFT) 100 MG tablet Take 1.5 tablets (150 mg total) by mouth daily. 135 tablet 1  . theophylline (UNIPHYL) 400 MG 24 hr tablet Take by mouth.    . tiotropium (SPIRIVA HANDIHALER) 18 MCG inhalation capsule Place into inhaler and  inhale.     No current facility-administered medications for this visit.    Medical Decision Making:  Established Problem, Stable/Improving (1) and Review of New Medication or Change in Dosage (2)  Treatment Plan Summary:Medication management and Plan   Continue  sertraline to 150 mg daily and Seroquel 50 mg at bedtime for mood stabilization and augmentation for depression. We are waiting clarification from her pulmonologist as to whether there is a concern of sertraline and fair with medication or whether the pulmonologist recommending a medicine  for smoking cessation.  Major depression-increase sertraline 150 mg. Continue Seroquel at 50 mg at bedtime.  Anxiety-sertraline as above. Will continue her Valium 5 mg in the morning and 10 mg at night.   Patient is aware I will be departing to clinic  and that she will be able to follow up with another provider within this clinic. She's been instructed to call any questions or concerns prior to her next appointment. We'll follow-up in 1 months.  11/15/15-we made contact with Daria Pastures, MD's office. Patient stated that Dr. Raul Del said Zoloft was interfering with the patient's breathing. We received a message back from Kathrin Greathouse RN who related that Dr. Raul Del did not state Zoloft was interfering with the patient's breathing. Thus it appears the issue was Dr. Raul Del perhaps mentioned Wellbutrin to help the patient stop smoking. Thus at this time are to continue with the treatment as above. A decision can be made at her next visit as to whether to add Wellbutrin to assist with smoking cessation.  Faith Rogue 11/14/2015, 1:09 PM

## 2015-12-07 NOTE — Progress Notes (Signed)
Per pt not [place]

## 2015-12-24 DIAGNOSIS — M545 Low back pain, unspecified: Secondary | ICD-10-CM | POA: Insufficient documentation

## 2016-01-10 ENCOUNTER — Ambulatory Visit: Payer: 59 | Admitting: Psychiatry

## 2016-01-10 ENCOUNTER — Ambulatory Visit (INDEPENDENT_AMBULATORY_CARE_PROVIDER_SITE_OTHER): Payer: 59 | Admitting: Psychiatry

## 2016-01-10 ENCOUNTER — Encounter: Payer: Self-pay | Admitting: Psychiatry

## 2016-01-10 VITALS — BP 130/80 | HR 94 | Temp 98.1°F | Ht 62.0 in | Wt 156.4 lb

## 2016-01-10 DIAGNOSIS — F331 Major depressive disorder, recurrent, moderate: Secondary | ICD-10-CM

## 2016-01-10 DIAGNOSIS — F411 Generalized anxiety disorder: Secondary | ICD-10-CM | POA: Diagnosis not present

## 2016-01-10 MED ORDER — SERTRALINE HCL 100 MG PO TABS: 150.0000 mg | ORAL_TABLET | Freq: Every day | ORAL | Status: DC

## 2016-01-10 MED ORDER — DIAZEPAM 2 MG PO TABS: 2.0000 mg | ORAL_TABLET | Freq: Three times a day (TID) | ORAL | Status: DC

## 2016-01-10 MED ORDER — QUETIAPINE FUMARATE 25 MG PO TABS: 75.0000 mg | ORAL_TABLET | Freq: Every day | ORAL | Status: DC

## 2016-01-10 NOTE — Progress Notes (Signed)
[facility] MD/PA/NP OP Progress Note  01/10/2016 12:09 PM Shirley Alvarado  MRN:  [id number]  Subjective:  Patient is a 65 year old female who presented for follow-up appointment. She was previously following Dr. Jimmye Norman. She came in with the oxygen tank. She has history of COPD and was tearful as she walked into my office. She reported that she is having some stressful time with her 42-year-old grandson. She reported that she continues to smoke on a daily basis and is interested in having her medications adjusted. She reported that she has discussed with Dr. Jimmye Norman about her medications and wants to start taking Wellbutrin. However she has been feeling very anxious and continues to take Valium on a regular basis. She is also taking Seroquel to help her with sleep. Patient currently denied having any suicidal ideations or plans. She is also receptive to her medications adjustment.   She reported that she lives with her brother and his family. His son is a drug addict and she has to hide her medications from him.  She is compliant with her medications. She denied having any suicidal homicidal ideations or plans. Chief Complaint: same Chief Complaint    Follow-up; Medication Refill     Visit Diagnosis:     ICD-9-CM ICD-10-CM   1. Major depressive disorder, recurrent episode, moderate (HCC) 296.32 F33.1   2. GAD (generalized anxiety disorder) 300.02 F41.1     Past Medical History:  Past Medical History  Diagnosis Date  . Anxiety   . Depression   . COPD (chronic obstructive pulmonary disease) ([place])   . Cancer ([employer])   . Hypertension     Past Surgical History  Procedure Laterality Date  . Colon surgery     Family History:  Family History  Problem Relation Age of Onset  . Lung cancer Mother   . Hypertension Mother   . Diabetes Mother   . Anxiety disorder Mother   . Depression Mother   . Hypertension Father   . Diabetes Father   . Heart attack Father   . Depression Father   . Anxiety  disorder Father   . Alcohol abuse Father   . Drug abuse Father   . Cancer Sister   . Diabetes Sister   . Hypertension Sister   . Obesity Sister   . Hypertension Brother   . Hypertension Brother   . Liver disease Brother   . Colon cancer Brother    Social History:  Social History   Social History  . Marital Status: Widowed    Spouse Name: N/A  . Number of Children: N/A  . Years of Education: N/A   Social History Main Topics  . Smoking status: Current Every Day Smoker -- 0.50 packs/day for 50 years    Types: Cigarettes    Start date: 05/21/1970  . Smokeless tobacco: Never Used     Comment: However patient relates that she smoked for 50 years and at one point she was up to 2 packs per day  . Alcohol Use: No  . Drug Use: No  . Sexual Activity: No   Other Topics Concern  . None   Social History Narrative   Additional History:   Assessment:   Musculoskeletal: Strength & Muscle Tone: within normal limits Gait & Station: Slow  Patient leans: N/A  Psychiatric Specialty Exam: Anxiety Symptoms include nervous/anxious behavior. Patient reports no insomnia or suicidal ideas.      Review of Systems  Psychiatric/Behavioral: Negative for depression, suicidal ideas, hallucinations, memory loss and  substance abuse. The patient is nervous/anxious. The patient does not have insomnia.   All other systems reviewed and are negative.   Blood pressure 130/80, pulse 94, temperature 98.1 F (36.7 C), temperature source Tympanic, height 5\' 2"  (1.575 m), weight 156 lb 6.4 oz (70.943 kg), SpO2 90 %.Body mass index is 28.6 kg/(m^2).  General Appearance: Fairly Groomed  Eye Contact:  Good  Speech:  Normal Rate  Volume:  Decreased  Mood:  Anxious and Depressed  Affect:  Tearful  Thought Process:  Linear  Orientation:  Full (Time, Place, and Person)  Thought Content:  WDL  Suicidal Thoughts:  No  Homicidal Thoughts:  No  Memory:  Immediate;   Good Recent;   Good Remote;   Good   Judgement:  Good  Insight:  Good  Psychomotor Activity:  Normal  Concentration:  Good  Recall:  Good  Fund of Knowledge: Good  Language: Good  Akathisia:  Negative  Handed:  Right unknown   AIMS (if indicated):  N/A  Assets:  Communication Skills Desire for Improvement  ADL's:  Intact  Cognition: WNL  Sleep:  good   Is the patient at risk to self?  No. Has the patient been a risk to self in the past 6 months?  No. Has the patient been a risk to self within the distant past?  No. Is the patient a risk to others?  No. Has the patient been a risk to others in the past 6 months?  No. Has the patient been a risk to others within the distant past?  No.  Current Medications: Current Outpatient Prescriptions  Medication Sig Dispense Refill  . albuterol (PROAIR HFA) 108 (90 BASE) MCG/ACT inhaler Inhale into the lungs.    Marland Kitchen amLODipine (NORVASC) 10 MG tablet Take by mouth.    . diazepam (VALIUM) 10 MG tablet Take one half a tablet in the morning and one whole tablet in the evening,. 45 tablet 4  . dicyclomine (BENTYL) 10 MG capsule TAKE 1 CAPSULE (10 MG TOTAL) BY MOUTH 4 (FOUR) TIMES DAILY BEFORE MEALS AND NIGHTLY.    . fluticasone (FLONASE) 50 MCG/ACT nasal spray Place into the nose.    Marland Kitchen Fluticasone-Salmeterol (ADVAIR DISKUS) 250-50 MCG/DOSE AEPB Inhale into the lungs.    Marland Kitchen liothyronine (CYTOMEL) 50 MCG tablet Take by mouth.    . loratadine (CLARITIN) 10 MG tablet Take by mouth.    . losartan-hydrochlorothiazide (HYZAAR) 100-25 MG per tablet TAKE 1 TABLET BY MOUTH DAILY.    . meloxicam (MOBIC) 15 MG tablet Take 15 mg by mouth. Take 1.5 tablets    . montelukast (SINGULAIR) 10 MG tablet TAKE 1 TABLET BY MOUTH NIGHTLY    . nystatin cream (MYCOSTATIN) Apply topically.    Marland Kitchen oxyCODONE (ROXICODONE) 15 MG immediate release tablet     . QUEtiapine (SEROQUEL) 50 MG tablet Take 1 tablet (50 mg total) by mouth at bedtime. 90 tablet 1  . ranitidine (ZANTAC) 150 MG capsule Take by mouth.    .  sertraline (ZOLOFT) 100 MG tablet Take 1.5 tablets (150 mg total) by mouth daily. 135 tablet 1  . theophylline (UNIPHYL) 400 MG 24 hr tablet Take by mouth.    . tiotropium (SPIRIVA HANDIHALER) 18 MCG inhalation capsule Place into inhaler and inhale.     No current facility-administered medications for this visit.    Medical Decision Making:  Established Problem, Stable/Improving (1) and Review of New Medication or Change in Dosage (2)  Treatment Plan Summary:Medication management and Plan  Discussed with patient about her medications treatment risks benefits and alternatives I will decrease Valium 2 mg by mouth 3 times a day and discussed with patient and she agreed with the plan We will continue on Zoloft 150 mg in the morning for her depression and anxiety She will be started on Seroquel 75 mg at bedtime to help with her anxiety and insomnia and she agreed with the plan She will  follow-up in 3 weeks or earlier depending on her symptoms   More than 50% of the time spent in psychoeducation, counseling and coordination of care.    This note was generated in part or whole with voice recognition software. Voice regonition is usually quite accurate but there are transcription errors that can and very often do occur. I apologize for any typographical errors that were not detected and corrected.    Rainey Pines, MD  01/10/2016, 12:09 PM

## 2016-01-30 ENCOUNTER — Ambulatory Visit (INDEPENDENT_AMBULATORY_CARE_PROVIDER_SITE_OTHER): Payer: 59 | Admitting: Psychiatry

## 2016-01-30 ENCOUNTER — Encounter: Payer: Self-pay | Admitting: Psychiatry

## 2016-01-30 VITALS — BP 118/62 | HR 97 | Temp 97.7°F | Ht 62.0 in | Wt 155.8 lb

## 2016-01-30 DIAGNOSIS — F411 Generalized anxiety disorder: Secondary | ICD-10-CM | POA: Diagnosis not present

## 2016-01-30 DIAGNOSIS — F331 Major depressive disorder, recurrent, moderate: Secondary | ICD-10-CM

## 2016-01-30 MED ORDER — BUPROPION HCL 75 MG PO TABS: 75.0000 mg | ORAL_TABLET | Freq: Every morning | ORAL | Status: DC

## 2016-01-30 MED ORDER — DIAZEPAM 2 MG PO TABS: 2.0000 mg | ORAL_TABLET | Freq: Three times a day (TID) | ORAL | Status: DC

## 2016-01-30 MED ORDER — QUETIAPINE FUMARATE 50 MG PO TABS: 50.0000 mg | ORAL_TABLET | Freq: Every day | ORAL | Status: DC

## 2016-01-30 MED ORDER — SERTRALINE HCL 100 MG PO TABS: 150.0000 mg | ORAL_TABLET | Freq: Every day | ORAL | Status: DC

## 2016-01-30 NOTE — Progress Notes (Signed)
BH MD/PA/NP OP Progress Note  01/30/2016 12:04 PM Shirley Alvarado  MRN:  [id number]  Subjective:  Patient is a 65 year old female who presented for follow-up appointment.  She came in with the oxygen tank. She has history of COPD and he stated that she was recently started on erythromycin. She stated that she is very much focused on talking situation. She reported that she has been smoking almost one pack per day. He reported that she has already tried nicotine patch and Chantix and they all made her crazy. She wants to try something else. Patient currently denied having any more swings anger and reported that the decrease in Valium was helpful. She did not try the higher dose of Seroquel. She sleeps well with 50 mg. She reported that she has never taken Wellbutrin and Dr. Jimmye Norman was talking to her about the same. She is receptive to medication adjustment as she wants to live longer and thinks that smoking is causing her more harm as she has history of cancer. She was also concerned about her brother who is going to have a surgery done next month. She reported that he is very helpful and takes care of her. Patient currently denied having any suicidal homicidal ideations or plans. She denied having any perceptual disturbances.    She reported that she lives with her brother and his family. His son is a drug addict and she has to hide her medications from him.  She is compliant with her medications.   Chief Complaint: same  Visit Diagnosis:     ICD-9-CM ICD-10-CM   1. Major depressive disorder, recurrent episode, moderate (HCC) 296.32 F33.1   2. GAD (generalized anxiety disorder) 300.02 F41.1     Past Medical History:  Past Medical History  Diagnosis Date  . Anxiety   . Depression   . COPD (chronic obstructive pulmonary disease) ([place])   . Cancer ([place])   . Hypertension     Past Surgical History  Procedure Laterality Date  . Colon surgery     Family History:  Family History  Problem  Relation Age of Onset  . Lung cancer Mother   . Hypertension Mother   . Diabetes Mother   . Anxiety disorder Mother   . Depression Mother   . Hypertension Father   . Diabetes Father   . Heart attack Father   . Depression Father   . Anxiety disorder Father   . Alcohol abuse Father   . Drug abuse Father   . Cancer Sister   . Diabetes Sister   . Hypertension Sister   . Obesity Sister   . Hypertension Brother   . Hypertension Brother   . Liver disease Brother   . Colon cancer Brother    Social History:  Social History   Social History  . Marital Status: Widowed    Spouse Name: N/A  . Number of Children: N/A  . Years of Education: N/A   Social History Main Topics  . Smoking status: Current Every Day Smoker -- 0.50 packs/day for 50 years    Types: Cigarettes    Start date: 05/21/1970  . Smokeless tobacco: Never Used     Comment: However patient relates that she smoked for 50 years and at one point she was up to 2 packs per day  . Alcohol Use: No  . Drug Use: No  . Sexual Activity: No   Other Topics Concern  . Not on file   Social History Narrative   Additional History:  Assessment:   Musculoskeletal: Strength & Muscle Tone: within normal limits Gait & Station: Slow  Patient leans: N/A  Psychiatric Specialty Exam: Anxiety Symptoms include nervous/anxious behavior. Patient reports no insomnia or suicidal ideas.      Review of Systems  Psychiatric/Behavioral: Positive for depression and substance abuse. Negative for suicidal ideas, hallucinations and memory loss. The patient is nervous/anxious. The patient does not have insomnia.   All other systems reviewed and are negative.   There were no vitals taken for this visit.There is no weight on file to calculate BMI.  General Appearance: Fairly Groomed  Eye Contact:  Good  Speech:  Normal Rate  Volume:  Decreased  Mood:  Anxious and Depressed  Affect:  Tearful  Thought Process:  Linear  Orientation:  Full  (Time, Place, and Person)  Thought Content:  WDL  Suicidal Thoughts:  No  Homicidal Thoughts:  No  Memory:  Immediate;   Good Recent;   Good Remote;   Good  Judgement:  Good  Insight:  Good  Psychomotor Activity:  Normal  Concentration:  Good  Recall:  Good  Fund of Knowledge: Good  Language: Good  Akathisia:  Negative  Handed:  Right unknown   AIMS (if indicated):  N/A  Assets:  Communication Skills Desire for Improvement  ADL's:  Intact  Cognition: WNL  Sleep:  good   Is the patient at risk to self?  No. Has the patient been a risk to self in the past 6 months?  No. Has the patient been a risk to self within the distant past?  No. Is the patient a risk to others?  No. Has the patient been a risk to others in the past 6 months?  No. Has the patient been a risk to others within the distant past?  No.  Current Medications: Current Outpatient Prescriptions  Medication Sig Dispense Refill  . albuterol (PROAIR HFA) 108 (90 BASE) MCG/ACT inhaler Inhale into the lungs.    Marland Kitchen amLODipine (NORVASC) 10 MG tablet Take by mouth.    . diazepam (VALIUM) 2 MG tablet Take 1 tablet (2 mg total) by mouth 3 (three) times daily. 90 tablet 0  . dicyclomine (BENTYL) 10 MG capsule TAKE 1 CAPSULE (10 MG TOTAL) BY MOUTH 4 (FOUR) TIMES DAILY BEFORE MEALS AND NIGHTLY.    . fluticasone (FLONASE) 50 MCG/ACT nasal spray Place into the nose.    Marland Kitchen Fluticasone-Salmeterol (ADVAIR DISKUS) 250-50 MCG/DOSE AEPB Inhale into the lungs.    Marland Kitchen liothyronine (CYTOMEL) 50 MCG tablet Take by mouth.    . loratadine (CLARITIN) 10 MG tablet Take by mouth.    . losartan-hydrochlorothiazide (HYZAAR) 100-25 MG per tablet TAKE 1 TABLET BY MOUTH DAILY.    . meloxicam (MOBIC) 15 MG tablet Take 15 mg by mouth. Take 1.5 tablets    . montelukast (SINGULAIR) 10 MG tablet TAKE 1 TABLET BY MOUTH NIGHTLY    . nystatin cream (MYCOSTATIN) Apply topically.    Marland Kitchen oxyCODONE (ROXICODONE) 15 MG immediate release tablet     . QUEtiapine  (SEROQUEL) 25 MG tablet Take 3 tablets (75 mg total) by mouth at bedtime. 90 tablet 0  . ranitidine (ZANTAC) 150 MG capsule Take by mouth.    . sertraline (ZOLOFT) 100 MG tablet Take 1.5 tablets (150 mg total) by mouth daily. 135 tablet 0  . theophylline (UNIPHYL) 400 MG 24 hr tablet Take by mouth.    . tiotropium (SPIRIVA HANDIHALER) 18 MCG inhalation capsule Place into inhaler and inhale.  No current facility-administered medications for this visit.    Medical Decision Making:  Established Problem, Stable/Improving (1) and Review of New Medication or Change in Dosage (2)  Treatment Plan Summary:Medication management and Plan     Discussed with patient about her medications treatment risks benefits and alternatives Continue  Valium 2 mg by mouth 3 times a day and discussed with patient and she agreed with the plan We will continue on Zoloft 150 mg in the morning for her depression and anxiety She will be started on Seroquel 50 mg at bedtime to help with her anxiety and insomnia and she agreed with the plan I will start her on Wellbutrin 75 mg in the morning for smoking cessation. Discussed with her about the side effects in detail and she demonstrated understanding. She will  follow-up in 4 weeks or earlier depending on her symptoms   More than 50% of the time spent in psychoeducation, counseling and coordination of care.    This note was generated in part or whole with voice recognition software. Voice regonition is usually quite accurate but there are transcription errors that can and very often do occur. I apologize for any typographical errors that were not detected and corrected.    Rainey Pines, MD  01/30/2016, 12:04 PM

## 2016-02-19 ENCOUNTER — Encounter: Payer: Self-pay | Admitting: Psychiatry

## 2016-02-19 ENCOUNTER — Ambulatory Visit (INDEPENDENT_AMBULATORY_CARE_PROVIDER_SITE_OTHER): Payer: 59 | Admitting: Psychiatry

## 2016-02-19 VITALS — BP 122/70 | HR 92 | Temp 97.6°F | Wt 156.2 lb

## 2016-02-19 DIAGNOSIS — F411 Generalized anxiety disorder: Secondary | ICD-10-CM

## 2016-02-19 DIAGNOSIS — F331 Major depressive disorder, recurrent, moderate: Secondary | ICD-10-CM | POA: Diagnosis not present

## 2016-02-19 MED ORDER — BUPROPION HCL 75 MG PO TABS: 75.0000 mg | ORAL_TABLET | Freq: Every morning | ORAL | Status: DC

## 2016-02-19 MED ORDER — DIAZEPAM 2 MG PO TABS: 2.0000 mg | ORAL_TABLET | Freq: Two times a day (BID) | ORAL | Status: DC

## 2016-02-19 NOTE — Progress Notes (Signed)
BH MD/PA/NP OP Progress Note  02/19/2016 11:59 AM Shirley Alvarado  MRN:  [id number]  Subjective:  Patient is a 65 year old female who presented for follow-up appointment.  She came in early for her appointment as her brother is in surgery for back pain at the [facility] in [place] today. She appeared anxious about him. She reported that she came with her church lately. Patient reported that she has started improving since her medications were adjusted. She still trying to cut down on her cigarette smoking but was unable to do so. She reported that she is worried about her COPD and wants to quit smoking. She reported that she has been trying on a daily basis and is currently smoking 10 cigarettes per day. Patient reported that Valium has been helpful and she is willing to cut down on the dosage of Valium as well. She currently denied having any more swings or anger. She stated that she sleeps well at night. She currently denied having any suicidal ideations or plans. Patient continues to be very thankful that she was seen earlier today as her appointment was next week and she was confused about her appointment. She was very happy about the same.  She reported that she will go to the hospital to see her brother later today.   She is compliant with her medications.   Chief Complaint: same Chief Complaint    Follow-up; Medication Refill     Visit Diagnosis:     ICD-9-CM ICD-10-CM   1. Major depressive disorder, recurrent episode, moderate (HCC) 296.32 F33.1   2. GAD (generalized anxiety disorder) 300.02 F41.1     Past Medical History:  Past Medical History  Diagnosis Date  . Anxiety   . Depression   . COPD (chronic obstructive pulmonary disease) ([place])   . Cancer ([place])   . Hypertension     Past Surgical History  Procedure Laterality Date  . Colon surgery     Family History:  Family History  Problem Relation Age of Onset  . Lung cancer Mother   . Hypertension Mother   . Diabetes  Mother   . Anxiety disorder Mother   . Depression Mother   . Hypertension Father   . Diabetes Father   . Heart attack Father   . Depression Father   . Anxiety disorder Father   . Alcohol abuse Father   . Drug abuse Father   . Cancer Sister   . Diabetes Sister   . Hypertension Sister   . Obesity Sister   . Hypertension Brother   . Hypertension Brother   . Liver disease Brother   . Colon cancer Brother    Social History:  Social History   Social History  . Marital Status: Widowed    Spouse Name: N/A  . Number of Children: N/A  . Years of Education: N/A   Social History Main Topics  . Smoking status: Current Every Day Smoker -- 0.50 packs/day for 50 years    Types: Cigarettes    Start date: 05/21/1970  . Smokeless tobacco: Never Used     Comment: However patient relates that she smoked for 50 years and at one point she was up to 2 packs per day  . Alcohol Use: No  . Drug Use: No  . Sexual Activity: No   Other Topics Concern  . None   Social History Narrative   Additional History:   Assessment:   Musculoskeletal: Strength & Muscle Tone: within normal limits Gait & Station:  Slow  Patient leans: N/A  Psychiatric Specialty Exam: Anxiety Symptoms include nervous/anxious behavior. Patient reports no insomnia or suicidal ideas.      Review of Systems  Psychiatric/Behavioral: Positive for depression and substance abuse. Negative for suicidal ideas, hallucinations and memory loss. The patient is nervous/anxious. The patient does not have insomnia.   All other systems reviewed and are negative.   Blood pressure 122/70, pulse 92, temperature 97.6 F (36.4 C), temperature source Tympanic, weight 156 lb 3.2 oz (70.852 kg), SpO2 90 %.Body mass index is 28.56 kg/(m^2).  General Appearance: Fairly Groomed  Eye Contact:  Good  Speech:  Normal Rate  Volume:  Decreased  Mood:  Anxious and Depressed  Affect:  Tearful  Thought Process:  Linear  Orientation:  Full (Time,  Place, and Person)  Thought Content:  WDL  Suicidal Thoughts:  No  Homicidal Thoughts:  No  Memory:  Immediate;   Good Recent;   Good Remote;   Good  Judgement:  Good  Insight:  Good  Psychomotor Activity:  Normal  Concentration:  Good  Recall:  Good  Fund of Knowledge: Good  Language: Good  Akathisia:  Negative  Handed:  Right unknown   AIMS (if indicated):  N/A  Assets:  Communication Skills Desire for Improvement  ADL's:  Intact  Cognition: WNL  Sleep:  good   Is the patient at risk to self?  No. Has the patient been a risk to self in the past 6 months?  No. Has the patient been a risk to self within the distant past?  No. Is the patient a risk to others?  No. Has the patient been a risk to others in the past 6 months?  No. Has the patient been a risk to others within the distant past?  No.  Current Medications: Current Outpatient Prescriptions  Medication Sig Dispense Refill  . albuterol (PROAIR HFA) 108 (90 BASE) MCG/ACT inhaler Inhale into the lungs.    Marland Kitchen amLODipine (NORVASC) 10 MG tablet Take by mouth.    Marland Kitchen buPROPion (WELLBUTRIN) 75 MG tablet Take 1 tablet (75 mg total) by mouth every morning. 30 tablet 0  . diazepam (VALIUM) 2 MG tablet Take 1 tablet (2 mg total) by mouth 3 (three) times daily. 90 tablet 0  . dicyclomine (BENTYL) 10 MG capsule TAKE 1 CAPSULE (10 MG TOTAL) BY MOUTH 4 (FOUR) TIMES DAILY BEFORE MEALS AND NIGHTLY.    . fluticasone (FLONASE) 50 MCG/ACT nasal spray Place into the nose.    Marland Kitchen Fluticasone-Salmeterol (ADVAIR DISKUS) 250-50 MCG/DOSE AEPB Inhale into the lungs.    Marland Kitchen loratadine (CLARITIN) 10 MG tablet Take by mouth.    . losartan-hydrochlorothiazide (HYZAAR) 100-25 MG per tablet TAKE 1 TABLET BY MOUTH DAILY.    . meloxicam (MOBIC) 15 MG tablet Take 15 mg by mouth. Take 1.5 tablets    . montelukast (SINGULAIR) 10 MG tablet TAKE 1 TABLET BY MOUTH NIGHTLY    . nystatin cream (MYCOSTATIN) Apply topically.    Marland Kitchen oxyCODONE (ROXICODONE) 15 MG  immediate release tablet     . QUEtiapine (SEROQUEL) 50 MG tablet Take 1 tablet (50 mg total) by mouth at bedtime. 30 tablet 1  . ranitidine (ZANTAC) 150 MG capsule Take by mouth.    . sertraline (ZOLOFT) 100 MG tablet Take 1.5 tablets (150 mg total) by mouth daily. 135 tablet 0  . tiotropium (SPIRIVA HANDIHALER) 18 MCG inhalation capsule Place into inhaler and inhale.    . liothyronine (CYTOMEL) 50 MCG tablet Take  by mouth.    . theophylline (UNIPHYL) 400 MG 24 hr tablet Take by mouth.     No current facility-administered medications for this visit.    Medical Decision Making:  Established Problem, Stable/Improving (1) and Review of New Medication or Change in Dosage (2)  Treatment Plan Summary:Medication management and Plan     Discussed with patient about her medications treatment risks benefits and alternatives Continue  Valium 2 mg by mouth BID  and discussed with patient and she agreed with the plan We will continue on Zoloft 150 mg in the morning for her depression and anxiety Continue Seroquel 50 mg at bedtime. Continue Wellbutrin 75 mg in the morning for smoking cessation. Discussed with her about the side effects in detail and she demonstrated understanding. She will  follow-up in 4 weeks or earlier depending on her symptoms   More than 50% of the time spent in psychoeducation, counseling and coordination of care.    This note was generated in part or whole with voice recognition software. Voice regonition is usually quite accurate but there are transcription errors that can and very often do occur. I apologize for any typographical errors that were not detected and corrected.    Rainey Pines, MD  02/19/2016, 11:59 AM

## 2016-02-27 ENCOUNTER — Ambulatory Visit: Payer: 59 | Admitting: Psychiatry

## 2016-02-28 ENCOUNTER — Telehealth: Payer: Self-pay

## 2016-02-28 NOTE — Telephone Encounter (Signed)
pt called states that she has stopped her valium completely.  but she has a headache and her pcp wanted her to call us and ask if she can increase her zoloft.

## 2016-02-29 NOTE — Telephone Encounter (Signed)
Advise pt not stop Valium suddenly as she can withdrawal symptoms. Ask her to continue taking 1 pill at bedtime for 2 weeks then stop.

## 2016-02-29 NOTE — Telephone Encounter (Signed)
spoke with patient gave instructions per Dr. Gretel Acre

## 2016-03-12 ENCOUNTER — Ambulatory Visit (INDEPENDENT_AMBULATORY_CARE_PROVIDER_SITE_OTHER): Payer: 59 | Admitting: Psychiatry

## 2016-03-12 VITALS — BP 119/71 | HR 86 | Temp 97.7°F | Resp 20 | Ht 61.0 in | Wt 151.8 lb

## 2016-03-12 DIAGNOSIS — F411 Generalized anxiety disorder: Secondary | ICD-10-CM | POA: Diagnosis not present

## 2016-03-12 DIAGNOSIS — F331 Major depressive disorder, recurrent, moderate: Secondary | ICD-10-CM | POA: Diagnosis not present

## 2016-03-12 MED ORDER — SERTRALINE HCL 100 MG PO TABS: 150.0000 mg | ORAL_TABLET | Freq: Every day | ORAL | Status: DC

## 2016-03-12 MED ORDER — BUPROPION HCL 75 MG PO TABS: 75.0000 mg | ORAL_TABLET | Freq: Every morning | ORAL | Status: DC

## 2016-03-12 MED ORDER — QUETIAPINE FUMARATE 25 MG PO TABS: 75.0000 mg | ORAL_TABLET | Freq: Every day | ORAL | Status: DC

## 2016-03-12 NOTE — Progress Notes (Signed)
BH MD/PA/NP OP Progress Note  03/12/2016 10:09 AM Shirley Alvarado  MRN:  [id number]  Subjective:  Patient is a 65 year old female who presented for follow-up appointment.  She reported that she has already stopped taking the Valium and is doing well. She reported that she is currently improving on the current medications. Patient reported that the church lately brought her to the appointment. Patient reported that the current dose of the medications are really helping her and she is working hard for smoking cessation. She is not smoking as much. She appeared calm and alert during the interview. Patient reported that the medications help with her mood stabilization as well and she does not feel as much depressed. Her brother has recovered successfully from the surgery and he is following up with his appointments as well. Patient reported that she has the support of her family members. She currently denied having any mood swings anger anxiety or paranoia. She uses oxygen on are regular basis and reported that she had some lung infections in the past and was using antibiotics but she is clear now.   She currently denied having any suicidal ideations or plans.  She is compliant with her medications.   Chief Complaint: same  Visit Diagnosis:     ICD-9-CM ICD-10-CM   1. Major depressive disorder, recurrent episode, moderate (HCC) 296.32 F33.1   2. GAD (generalized anxiety disorder) 300.02 F41.1     Past Medical History:  Past Medical History  Diagnosis Date  . Anxiety   . Depression   . COPD (chronic obstructive pulmonary disease) ([place])   . Cancer ([place])   . Hypertension     Past Surgical History  Procedure Laterality Date  . Colon surgery     Family History:  Family History  Problem Relation Age of Onset  . Lung cancer Mother   . Hypertension Mother   . Diabetes Mother   . Anxiety disorder Mother   . Depression Mother   . Hypertension Father   . Diabetes Father   . Heart attack  Father   . Depression Father   . Anxiety disorder Father   . Alcohol abuse Father   . Drug abuse Father   . Cancer Sister   . Diabetes Sister   . Hypertension Sister   . Obesity Sister   . Hypertension Brother   . Hypertension Brother   . Liver disease Brother   . Colon cancer Brother    Social History:  Social History   Social History  . Marital Status: Widowed    Spouse Name: N/A  . Number of Children: N/A  . Years of Education: N/A   Social History Main Topics  . Smoking status: Current Every Day Smoker -- 0.50 packs/day for 50 years    Types: Cigarettes    Start date: 05/21/1970  . Smokeless tobacco: Never Used     Comment: However patient relates that she smoked for 50 years and at one point she was up to 2 packs per day  . Alcohol Use: No  . Drug Use: No  . Sexual Activity: No   Other Topics Concern  . Not on file   Social History Narrative   Additional History:   Assessment:   Musculoskeletal: Strength & Muscle Tone: within normal limits Gait & Station: Slow  Patient leans: N/A  Psychiatric Specialty Exam: Anxiety Symptoms include nervous/anxious behavior. Patient reports no insomnia or suicidal ideas.      Review of Systems  Psychiatric/Behavioral: Positive for depression  and substance abuse. Negative for suicidal ideas, hallucinations and memory loss. The patient is nervous/anxious. The patient does not have insomnia.   All other systems reviewed and are negative.   There were no vitals taken for this visit.There is no weight on file to calculate BMI.  General Appearance: Fairly Groomed  Eye Contact:  Good  Speech:  Normal Rate  Volume:  Decreased  Mood:  Euthymic  Affect:  Appropriate  Thought Process:  Linear  Orientation:  Full (Time, Place, and Person)  Thought Content:  WDL  Suicidal Thoughts:  No  Homicidal Thoughts:  No  Memory:  Immediate;   Good Recent;   Good Remote;   Good  Judgement:  Good  Insight:  Good  Psychomotor  Activity:  Normal  Concentration:  Good  Recall:  Good  Fund of Knowledge: Good  Language: Good  Akathisia:  Negative  Handed:  Right unknown   AIMS (if indicated):  N/A  Assets:  Communication Skills Desire for Improvement  ADL's:  Intact  Cognition: WNL  Sleep:  good   Is the patient at risk to self?  No. Has the patient been a risk to self in the past 6 months?  No. Has the patient been a risk to self within the distant past?  No. Is the patient a risk to others?  No. Has the patient been a risk to others in the past 6 months?  No. Has the patient been a risk to others within the distant past?  No.  Current Medications: Current Outpatient Prescriptions  Medication Sig Dispense Refill  . albuterol (PROAIR HFA) 108 (90 BASE) MCG/ACT inhaler Inhale into the lungs.    Marland Kitchen amLODipine (NORVASC) 10 MG tablet Take by mouth.    Marland Kitchen buPROPion (WELLBUTRIN) 75 MG tablet Take 1 tablet (75 mg total) by mouth every morning. 30 tablet 1  . diazepam (VALIUM) 2 MG tablet Take 1 tablet (2 mg total) by mouth 2 (two) times daily. 60 tablet 0  . dicyclomine (BENTYL) 10 MG capsule TAKE 1 CAPSULE (10 MG TOTAL) BY MOUTH 4 (FOUR) TIMES DAILY BEFORE MEALS AND NIGHTLY.    . fluticasone (FLONASE) 50 MCG/ACT nasal spray Place into the nose.    Marland Kitchen Fluticasone-Salmeterol (ADVAIR DISKUS) 250-50 MCG/DOSE AEPB Inhale into the lungs.    Marland Kitchen liothyronine (CYTOMEL) 50 MCG tablet Take by mouth.    . loratadine (CLARITIN) 10 MG tablet Take by mouth.    . losartan-hydrochlorothiazide (HYZAAR) 100-25 MG per tablet TAKE 1 TABLET BY MOUTH DAILY.    . meloxicam (MOBIC) 15 MG tablet Take 15 mg by mouth. Take 1.5 tablets    . montelukast (SINGULAIR) 10 MG tablet TAKE 1 TABLET BY MOUTH NIGHTLY    . nystatin cream (MYCOSTATIN) Apply topically.    Marland Kitchen oxyCODONE (ROXICODONE) 15 MG immediate release tablet     . QUEtiapine (SEROQUEL) 50 MG tablet Take 1 tablet (50 mg total) by mouth at bedtime. 30 tablet 1  . ranitidine (ZANTAC) 150  MG capsule Take by mouth.    . sertraline (ZOLOFT) 100 MG tablet Take 1.5 tablets (150 mg total) by mouth daily. 135 tablet 0  . theophylline (UNIPHYL) 400 MG 24 hr tablet Take by mouth.    . tiotropium (SPIRIVA HANDIHALER) 18 MCG inhalation capsule Place into inhaler and inhale.     No current facility-administered medications for this visit.    Medical Decision Making:  Established Problem, Stable/Improving (1) and Review of New Medication or Change in Dosage (2)  Treatment Plan Summary:Medication management and Plan     Discussed with patient about her medications treatment risks benefits and alternatives His continue Valium as patient has already stopped taking the medication We will continue on Zoloft 150 mg in the morning for her depression and anxiety Continue Seroquel 75  mg at bedtime. Continue Wellbutrin 75 mg in the morning for smoking cessation. Discussed with her about the side effects in detail and she demonstrated understanding. She will  follow-up in 2 months or earlier depending on her symptoms   More than 50% of the time spent in psychoeducation, counseling and coordination of care.    This note was generated in part or whole with voice recognition software. Voice regonition is usually quite accurate but there are transcription errors that can and very often do occur. I apologize for any typographical errors that were not detected and corrected.    Rainey Pines, MD  03/12/2016, 10:09 AM

## 2016-03-17 ENCOUNTER — Telehealth: Payer: Self-pay

## 2016-03-17 NOTE — Telephone Encounter (Signed)
Please advised pt to stop taking Wellbutrin.

## 2016-03-18 ENCOUNTER — Telehealth: Payer: Self-pay

## 2016-03-18 NOTE — Telephone Encounter (Signed)
SPOKE WITH PATIENT SHE STATES THAT SHE HAS STOPPED THE MEDICATIONS

## 2016-03-18 NOTE — Telephone Encounter (Signed)
PT STATES SHE NEEDS SOMETHING SHE STATES THAT SHE CAN NOT COME IN SHE DOESN'T HAVE TRANSPORTATION OR THE MONEY TO COME BACK IN.  PLEASE JUST CALL IN SOMETHING

## 2016-03-18 NOTE — Telephone Encounter (Signed)
PT CALLED LEFT A MESSAGE THAT SHE NEEDS SOMETHING CALLED IN FOR PANIC ATTACK.  PT WAS JUST SEEN LAST WEEK ON  03-12-16

## 2016-03-18 NOTE — Telephone Encounter (Signed)
STATES THAT SHE HAS STOPPED THE [facility]

## 2016-03-19 MED ORDER — HYDROXYZINE PAMOATE 25 MG PO CAPS: 25.0000 mg | ORAL_CAPSULE | Freq: Three times a day (TID) | ORAL | Status: DC | PRN

## 2016-03-19 NOTE — Telephone Encounter (Signed)
PT CALLED AGAIN WANTING TO FIND OUT IF YOU HAD CALLED IN ANYTHING. PT STATES SHE CAN NOT KEEP FEELING LIKE THIS.

## 2016-03-19 NOTE — Telephone Encounter (Signed)
Please make appt to discuss her meds.  D/c Wellbutrin

## 2016-03-20 ENCOUNTER — Telehealth: Payer: Self-pay

## 2016-03-20 NOTE — Telephone Encounter (Signed)
left a message to call our office back.  pt was advised that per dr. Gretel Acre she needed to see her again.  Advised to call and make an appt.

## 2016-03-20 NOTE — Telephone Encounter (Signed)
pt called left a message to call back. pt states that she can not afford to come back in to see dr. Gretel Acre.

## 2016-03-25 ENCOUNTER — Ambulatory Visit (INDEPENDENT_AMBULATORY_CARE_PROVIDER_SITE_OTHER): Payer: 59 | Admitting: Psychiatry

## 2016-03-25 ENCOUNTER — Encounter: Payer: Self-pay | Admitting: Psychiatry

## 2016-03-25 VITALS — BP 108/62 | HR 100 | Temp 97.8°F | Wt 152.4 lb

## 2016-03-25 DIAGNOSIS — F4001 Agoraphobia with panic disorder: Secondary | ICD-10-CM | POA: Diagnosis not present

## 2016-03-25 DIAGNOSIS — F331 Major depressive disorder, recurrent, moderate: Secondary | ICD-10-CM

## 2016-03-25 MED ORDER — SERTRALINE HCL 100 MG PO TABS
150.0000 mg | ORAL_TABLET | Freq: Every day | ORAL | Status: DC
Start: 2016-03-25 — End: 2016-06-17

## 2016-03-25 MED ORDER — ALPRAZOLAM 0.5 MG PO TABS: 0.5000 mg | ORAL_TABLET | Freq: Two times a day (BID) | ORAL | Status: DC | PRN

## 2016-03-25 NOTE — Telephone Encounter (Signed)
Documentation

## 2016-03-25 NOTE — Telephone Encounter (Signed)
pt was seen today in the office.

## 2016-03-25 NOTE — Progress Notes (Signed)
BH MD/PA/NP OP Progress Note  03/25/2016 1:29 PM Shirley Alvarado  MRN:  [id number]  Subjective:  Patient is a 65 year old female who presented for follow-up appointment.  She reported that she has already stopped taking the  Wellbutrin and the medication is making her very agitated. She reported that she is having worsening of her anxiety symptoms and she brought back the 2 bottles of Vistaril which was prescribed to her in the past. She reported that her pulmonologist has told her that she is going to die soon and she is willing to try the Valium again. She reported that she has taken Valium for a long time in the past. She appeared anxious and apprehensive in the past. She reported that the medications are not helping her. She was focused on getting some medications to help with her anxiety as it is getting worse. She continues to smoke 10 cigarettes per day. We discussed about several medication options and she is willing to try Xanax at this time. She currently denied having any suicidal ideations or plans. She denied having any perceptual disturbances.    She uses oxygen on are regular basis and reported that she had some lung infections in the past and was using antibiotics but she is clear now.     Chief Complaint: same Chief Complaint    Follow-up; Medication Problem; Anxiety     Visit Diagnosis:   No diagnosis found.  Past Medical History:  Past Medical History  Diagnosis Date  . Anxiety   . Depression   . COPD (chronic obstructive pulmonary disease) ([place])   . Cancer ([place])   . Hypertension     Past Surgical History  Procedure Laterality Date  . Colon surgery     Family History:  Family History  Problem Relation Age of Onset  . Lung cancer Mother   . Hypertension Mother   . Diabetes Mother   . Anxiety disorder Mother   . Depression Mother   . Hypertension Father   . Diabetes Father   . Heart attack Father   . Depression Father   . Anxiety disorder Father   .  Alcohol abuse Father   . Drug abuse Father   . Cancer Sister   . Diabetes Sister   . Hypertension Sister   . Obesity Sister   . Hypertension Brother   . Hypertension Brother   . Liver disease Brother   . Colon cancer Brother    Social History:  Social History   Social History  . Marital Status: Widowed    Spouse Name: N/A  . Number of Children: N/A  . Years of Education: N/A   Social History Main Topics  . Smoking status: Current Every Day Smoker -- 0.50 packs/day for 50 years    Types: Cigarettes    Start date: 05/21/1970  . Smokeless tobacco: Never Used     Comment: However patient relates that she smoked for 50 years and at one point she was up to 2 packs per day  . Alcohol Use: No  . Drug Use: No  . Sexual Activity: No   Other Topics Concern  . None   Social History Narrative   Additional History:   Assessment:   Musculoskeletal: Strength & Muscle Tone: within normal limits Gait & Station: Slow  Patient leans: N/A  Psychiatric Specialty Exam: Anxiety Symptoms include nervous/anxious behavior. Patient reports no insomnia or suicidal ideas.      Review of Systems  Psychiatric/Behavioral: Positive for depression and  substance abuse. Negative for suicidal ideas, hallucinations and memory loss. The patient is nervous/anxious. The patient does not have insomnia.   All other systems reviewed and are negative.   Blood pressure 108/62, pulse 100, temperature 97.8 F (36.6 C), temperature source Tympanic, weight 152 lb 6.4 oz (69.128 kg), SpO2 92 %.Body mass index is 28.81 kg/(m^2).  General Appearance: Fairly Groomed  Eye Contact:  Good  Speech:  Normal Rate  Volume:  Decreased  Mood:  Euthymic  Affect:  Appropriate  Thought Process:  Linear  Orientation:  Full (Time, Place, and Person)  Thought Content:  WDL  Suicidal Thoughts:  No  Homicidal Thoughts:  No  Memory:  Immediate;   Good Recent;   Good Remote;   Good  Judgement:  Good  Insight:  Good   Psychomotor Activity:  Normal  Concentration:  Good  Recall:  Good  Fund of Knowledge: Good  Language: Good  Akathisia:  Negative  Handed:  Right unknown   AIMS (if indicated):  N/A  Assets:  Communication Skills Desire for Improvement  ADL's:  Intact  Cognition: WNL  Sleep:  good   Is the patient at risk to self?  No. Has the patient been a risk to self in the past 6 months?  No. Has the patient been a risk to self within the distant past?  No. Is the patient a risk to others?  No. Has the patient been a risk to others in the past 6 months?  No. Has the patient been a risk to others within the distant past?  No.  Current Medications: Current Outpatient Prescriptions  Medication Sig Dispense Refill  . albuterol (PROAIR HFA) 108 (90 BASE) MCG/ACT inhaler Inhale into the lungs.    Marland Kitchen amLODipine (NORVASC) 10 MG tablet Take by mouth.    . dicyclomine (BENTYL) 10 MG capsule TAKE 1 CAPSULE (10 MG TOTAL) BY MOUTH 4 (FOUR) TIMES DAILY BEFORE MEALS AND NIGHTLY.    Marland Kitchen Fluticasone-Salmeterol (ADVAIR DISKUS) 250-50 MCG/DOSE AEPB Inhale into the lungs.    Marland Kitchen loratadine (CLARITIN) 10 MG tablet Take by mouth.    . losartan-hydrochlorothiazide (HYZAAR) 100-25 MG per tablet TAKE 1 TABLET BY MOUTH DAILY.    . meloxicam (MOBIC) 15 MG tablet Take 15 mg by mouth. Take 1.5 tablets    . montelukast (SINGULAIR) 10 MG tablet TAKE 1 TABLET BY MOUTH NIGHTLY    . nystatin cream (MYCOSTATIN) Apply topically.    Marland Kitchen oxyCODONE (ROXICODONE) 15 MG immediate release tablet     . QUEtiapine (SEROQUEL) 25 MG tablet Take 3 tablets (75 mg total) by mouth at bedtime. 90 tablet 3  . ranitidine (ZANTAC) 150 MG capsule Take by mouth.    . sertraline (ZOLOFT) 100 MG tablet Take 1.5 tablets (150 mg total) by mouth daily. 135 tablet 0  . tiotropium (SPIRIVA HANDIHALER) 18 MCG inhalation capsule Place into inhaler and inhale.    Marland Kitchen ALPRAZolam (XANAX) 0.5 MG tablet Take 1 tablet (0.5 mg total) by mouth 2 (two) times daily as  needed for anxiety. 60 tablet 0  . fluticasone (FLONASE) 50 MCG/ACT nasal spray Place into the nose.    . liothyronine (CYTOMEL) 50 MCG tablet Take by mouth.    . theophylline (UNIPHYL) 400 MG 24 hr tablet Take by mouth.     No current facility-administered medications for this visit.    Medical Decision Making:  Established Problem, Stable/Improving (1) and Review of New Medication or Change in Dosage (2)  Treatment Plan Summary:Medication management  and Plan      We will continue on Zoloft 150 mg in the morning for her depression and anxiety Continue Seroquel 75  mg at bedtime. I will start her on Xanax 0.5 mg by mouth twice a day when necessary for her anxiety symptoms and she agreed with the plan She will  follow-up in 1 months or earlier depending on her symptoms   More than 50% of the time spent in psychoeducation, counseling and coordination of care.    This note was generated in part or whole with voice recognition software. Voice regonition is usually quite accurate but there are transcription errors that can and very often do occur. I apologize for any typographical errors that were not detected and corrected.    Rainey Pines, MD  03/25/2016, 1:29 PM

## 2016-04-01 ENCOUNTER — Ambulatory Visit: Payer: 59 | Admitting: Psychiatry

## 2016-04-15 ENCOUNTER — Other Ambulatory Visit: Payer: Self-pay | Admitting: Psychiatry

## 2016-04-17 NOTE — Telephone Encounter (Signed)
pt called again about medication.  pt states that the xanax is not working would like to go back on the valium.

## 2016-04-21 ENCOUNTER — Ambulatory Visit (INDEPENDENT_AMBULATORY_CARE_PROVIDER_SITE_OTHER): Payer: 59 | Admitting: Psychiatry

## 2016-04-21 ENCOUNTER — Encounter: Payer: Self-pay | Admitting: Psychiatry

## 2016-04-21 VITALS — BP 122/78 | Temp 97.0°F | Ht 61.0 in | Wt 152.4 lb

## 2016-04-21 DIAGNOSIS — F4001 Agoraphobia with panic disorder: Secondary | ICD-10-CM

## 2016-04-21 DIAGNOSIS — F331 Major depressive disorder, recurrent, moderate: Secondary | ICD-10-CM | POA: Diagnosis not present

## 2016-04-21 MED ORDER — DIAZEPAM 5 MG PO TABS: 5.0000 mg | ORAL_TABLET | Freq: Every day | ORAL | Status: DC

## 2016-04-21 MED ORDER — QUETIAPINE FUMARATE 25 MG PO TABS: 75.0000 mg | ORAL_TABLET | Freq: Every day | ORAL | Status: DC

## 2016-04-21 NOTE — Progress Notes (Signed)
BH MD/PA/NP OP Progress Note  04/21/2016 10:19 AM Shirley Alvarado  MRN:  [id number]  Subjective:  Patient is a 65 year old female who presented for follow-up appointment.  She reported that she wants to have her medications adjusted and would like to try Valium 5 mg at bedtime. She reported that the alprazolam is not helping her and she continues to have anxiety symptoms. Patient reported that she has been taking Seroquel at bedtime and it is helping her with sleep. She appeared calm and alert during the interview. She reported that she is also taking sertraline in the morning. She reported that her breathing is improving. She currently denied having any suicidal ideations or plans. We discussed about her medications at length. She is compliant with her medications at this time. She uses oxygen on a regular basis and brought the oxygen tank with her.She denied having any perceptual disturbances.       Chief Complaint:  Chief Complaint    Medication Problem     Visit Diagnosis:     ICD-9-CM ICD-10-CM   1. Major depressive disorder, recurrent episode, moderate (HCC) 296.32 F33.1   2. 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 Anxiety   . Depression   . COPD (chronic obstructive pulmonary disease) ([place])   . Cancer ([place])   . Hypertension     Past Surgical History  Procedure Laterality Date  . Colon surgery     Family History:  Family History  Problem Relation Age of Onset  . Lung cancer Mother   . Hypertension Mother   . Diabetes Mother   . Anxiety disorder Mother   . Depression Mother   . Hypertension Father   . Diabetes Father   . Heart attack Father   . Depression Father   . Anxiety disorder Father   . Alcohol abuse Father   . Drug abuse Father   . Cancer Sister   . Diabetes Sister   . Hypertension Sister   . Obesity Sister   . Hypertension Brother   . Hypertension  Brother   . Liver disease Brother   . Colon cancer Brother    Social History:  Social History   Social History  . Marital Status: Widowed    Spouse Name: N/A  . Number of Children: N/A  . Years of Education: N/A   Social History Main Topics  . Smoking status: Current Every Day Smoker -- 0.50 packs/day for 50 years    Types: Cigarettes    Start date: 05/21/1970  . Smokeless tobacco: Never Used     Comment: However patient relates that she smoked for 50 years and at one point she was up to 2 packs per day  . Alcohol Use: No  . Drug Use: No  . Sexual Activity: No   Other Topics Concern  . None   Social History Narrative   Additional History:   Assessment:   Musculoskeletal: Strength & Muscle Tone: within normal limits Gait & Station: Slow  Patient leans: N/A  Psychiatric Specialty Exam: Anxiety Symptoms include nervous/anxious behavior. Patient reports no insomnia or suicidal ideas.      Review of Systems  Psychiatric/Behavioral: Positive for depression and substance abuse. Negative for suicidal ideas, hallucinations and memory loss. The patient is nervous/anxious. The patient does not have insomnia.   All other systems reviewed and are negative.   Blood pressure 122/78, temperature 97  F (36.1 C), temperature source Tympanic, height 5\' 1"  (1.549 m), weight 152 lb 6.4 oz (69.128 kg), SpO2 92 %.Body mass index is 28.81 kg/(m^2).  General Appearance: Fairly Groomed  Eye Contact:  Good  Speech:  Normal Rate  Volume:  Normal  Mood:  Euthymic  Affect:  Appropriate  Thought Process:  Linear  Orientation:  Full (Time, Place, and Person)  Thought Content:  WDL  Suicidal Thoughts:  No  Homicidal Thoughts:  No  Memory:  Immediate;   Good Recent;   Good Remote;   Good  Judgement:  Good  Insight:  Good  Psychomotor Activity:  Normal  Concentration:  Good  Recall:  Good  Fund of Knowledge: Good  Language: Good  Akathisia:  Negative  Handed:  Right unknown    AIMS (if indicated):  N/A  Assets:  Communication Skills Desire for Improvement  ADL's:  Intact  Cognition: WNL  Sleep:  good   Is the patient at risk to self?  No. Has the patient been a risk to self in the past 6 months?  No. Has the patient been a risk to self within the distant past?  No. Is the patient a risk to others?  No. Has the patient been a risk to others in the past 6 months?  No. Has the patient been a risk to others within the distant past?  No.  Current Medications: Current Outpatient Prescriptions  Medication Sig Dispense Refill  . albuterol (PROAIR HFA) 108 (90 BASE) MCG/ACT inhaler Inhale into the lungs.    . ALPRAZolam (XANAX) 0.5 MG tablet Take 1 tablet (0.5 mg total) by mouth 2 (two) times daily as needed for anxiety. 60 tablet 0  . amLODipine (NORVASC) 10 MG tablet Take by mouth.    . dicyclomine (BENTYL) 10 MG capsule TAKE 1 CAPSULE (10 MG TOTAL) BY MOUTH 4 (FOUR) TIMES DAILY BEFORE MEALS AND NIGHTLY.    Marland Kitchen Fluticasone-Salmeterol (ADVAIR DISKUS) 250-50 MCG/DOSE AEPB Inhale into the lungs.    Marland Kitchen loratadine (CLARITIN) 10 MG tablet Take by mouth.    . losartan-hydrochlorothiazide (HYZAAR) 100-25 MG per tablet TAKE 1 TABLET BY MOUTH DAILY.    . meloxicam (MOBIC) 15 MG tablet Take 15 mg by mouth. Take 1.5 tablets    . montelukast (SINGULAIR) 10 MG tablet TAKE 1 TABLET BY MOUTH NIGHTLY    . nystatin cream (MYCOSTATIN) Apply topically.    Marland Kitchen oxyCODONE (ROXICODONE) 15 MG immediate release tablet     . QUEtiapine (SEROQUEL) 25 MG tablet Take 3 tablets (75 mg total) by mouth at bedtime. 90 tablet 3  . ranitidine (ZANTAC) 150 MG capsule Take by mouth.    . sertraline (ZOLOFT) 100 MG tablet Take 1.5 tablets (150 mg total) by mouth daily. 135 tablet 0  . tiotropium (SPIRIVA HANDIHALER) 18 MCG inhalation capsule Place into inhaler and inhale.    . fluticasone (FLONASE) 50 MCG/ACT nasal spray Place into the nose.    . liothyronine (CYTOMEL) 50 MCG tablet Take by mouth.    .  theophylline (UNIPHYL) 400 MG 24 hr tablet Take by mouth.     No current facility-administered medications for this visit.    Medical Decision Making:  Established Problem, Stable/Improving (1) and Review of New Medication or Change in Dosage (2)  Treatment Plan Summary:Medication management and Plan      We will continue on Zoloft 150 mg in the morning for her depression and anxiety Continue Seroquel 75  mg at bedtime. I will start her  on Valium 5 mg by mouth daily at bedtime. She reported that she did well on that given the past and would like to try to gain. Agreed with the plan. She was given 2 month supply of the medications.  She has supply of the other medications. She will  follow-up in 2 months or earlier depending on her symptoms   More than 50% of the time spent in psychoeducation, counseling and coordination of care.    This note was generated in part or whole with voice recognition software. Voice regonition is usually quite accurate but there are transcription errors that can and very often do occur. I apologize for any typographical errors that were not detected and corrected.    Rainey Pines, MD  04/21/2016, 10:19 AM

## 2016-06-17 ENCOUNTER — Encounter: Payer: Self-pay | Admitting: Psychiatry

## 2016-06-17 ENCOUNTER — Ambulatory Visit (INDEPENDENT_AMBULATORY_CARE_PROVIDER_SITE_OTHER): Payer: 59 | Admitting: Psychiatry

## 2016-06-17 VITALS — BP 105/62 | HR 103 | Ht 61.0 in | Wt 149.0 lb

## 2016-06-17 DIAGNOSIS — F331 Major depressive disorder, recurrent, moderate: Secondary | ICD-10-CM | POA: Diagnosis not present

## 2016-06-17 DIAGNOSIS — Z634 Disappearance and death of family member: Secondary | ICD-10-CM

## 2016-06-17 DIAGNOSIS — Z789 Other specified health status: Secondary | ICD-10-CM | POA: Diagnosis not present

## 2016-06-17 MED ORDER — DIAZEPAM 5 MG PO TABS: 5.0000 mg | ORAL_TABLET | Freq: Every day | ORAL | 1 refills | Status: DC

## 2016-06-17 MED ORDER — SERTRALINE HCL 100 MG PO TABS: 150.0000 mg | ORAL_TABLET | Freq: Every day | ORAL | 0 refills | Status: DC

## 2016-06-17 NOTE — Progress Notes (Signed)
BH MD/PA/NP OP Progress Note  06/17/2016 12:58 PM Shirley Alvarado  MRN:  [id number]  Subjective:  Patient is a 65 year old female who presented for follow-up appointment.  She was tearful during the interview as she reported that her nephew who was 2 year old committed suicide by overdose on the pain medications over the weekend. She reported that he had history of colon cancer and was suffering for the past 10 years. She reported that he was very close to him. She reported that now her brother and his family are very depressed. She has been helping them. She reported that she took extra Valium over the past few days as she was unable to control her anxiety. Patient was sad and tearful during the interview. We discussed at length about the pain and the related issues. She reported that he has been trying to bear his pain. Patient reported that she has been trying to decrease the use of Valium at this time. She appeared apprehensive. She reported that she has been compliant with her medications. Her medications are helping her and she does not want to change any medications at this time. She is is not having any suicidal ideations or plans at this time. She denied having any perceptual disturbances.     Chief Complaint:  Chief Complaint    Follow-up; Medication Refill     Visit Diagnosis:     ICD-9-CM ICD-10-CM   1. Major depressive disorder, recurrent episode, moderate (HCC) 296.32 F33.1   2. Recent bereavement V49.89 Z36.9     Past Medical History:  Past Medical History:  Diagnosis Date  . Anxiety   . Cancer ([place])   . COPD (chronic obstructive pulmonary disease) ([place])   . Depression   . Hypertension     Past Surgical History:  Procedure Laterality Date  . COLON SURGERY     Family History:  Family History  Problem Relation Age of Onset  . Lung cancer Mother   . Hypertension Mother   . Diabetes Mother   . Anxiety disorder Mother   . Depression Mother   . Hypertension Father    . Diabetes Father   . Heart attack Father   . Depression Father   . Anxiety disorder Father   . Alcohol abuse Father   . Drug abuse Father   . Cancer Sister   . Diabetes Sister   . Hypertension Sister   . Obesity Sister   . Hypertension Brother   . Hypertension Brother   . Liver disease Brother   . Colon cancer Brother    Social History:  Social History   Social History  . Marital status: Widowed    Spouse name: N/A  . Number of children: N/A  . Years of education: N/A   Social History Main Topics  . Smoking status: Current Every Day Smoker    Packs/day: 0.50    Years: 50.00    Types: Cigarettes    Start date: 05/21/1970  . Smokeless tobacco: Never Used     Comment: However patient relates that she smoked for 50 years and at one point she was up to 2 packs per day  . Alcohol use No  . Drug use: No  . Sexual activity: No   Other Topics Concern  . None   Social History Narrative  . None   Additional History:   Assessment:   Musculoskeletal: Strength & Muscle Tone: within normal limits Gait & Station: Slow  Patient leans: N/A  Psychiatric Specialty Exam: Anxiety  Symptoms include nervous/anxious behavior. Patient reports no insomnia or suicidal ideas.    Medication Refill     Review of Systems  Psychiatric/Behavioral: Positive for depression and substance abuse. Negative for hallucinations, memory loss and suicidal ideas. The patient is nervous/anxious. The patient does not have insomnia.   All other systems reviewed and are negative.   Blood pressure 105/62, pulse (!) 103, height 5\' 1"  (1.549 m), weight 149 lb (67.6 kg), SpO2 90 %.Body mass index is 28.15 kg/m.  General Appearance: Fairly Groomed  Eye Contact:  Good  Speech:  Normal Rate  Volume:  Normal  Mood:  Euthymic  Affect:  Appropriate  Thought Process:  Linear  Orientation:  Full (Time, Place, and Person)  Thought Content:  WDL  Suicidal Thoughts:  No  Homicidal Thoughts:  No  Memory:   Immediate;   Good Recent;   Good Remote;   Good  Judgement:  Good  Insight:  Good  Psychomotor Activity:  Normal  Concentration:  Good  Recall:  Good  Fund of Knowledge: Good  Language: Good  Akathisia:  Negative  Handed:  Right unknown   AIMS (if indicated):  N/A  Assets:  Communication Skills Desire for Improvement  ADL's:  Intact  Cognition: WNL  Sleep:  good   Is the patient at risk to self?  No. Has the patient been a risk to self in the past 6 months?  No. Has the patient been a risk to self within the distant past?  No. Is the patient a risk to others?  No. Has the patient been a risk to others in the past 6 months?  No. Has the patient been a risk to others within the distant past?  No.  Current Medications: Current Outpatient Prescriptions  Medication Sig Dispense Refill  . albuterol (PROAIR HFA) 108 (90 BASE) MCG/ACT inhaler Inhale into the lungs.    Marland Kitchen amLODipine (NORVASC) 10 MG tablet Take by mouth.    . diazepam (VALIUM) 5 MG tablet Take 1 tablet (5 mg total) by mouth at bedtime. 30 tablet 1  . diazepam (VALIUM) 5 MG tablet Take 1 tablet (5 mg total) by mouth at bedtime. 30 tablet 1  . dicyclomine (BENTYL) 10 MG capsule TAKE 1 CAPSULE (10 MG TOTAL) BY MOUTH 4 (FOUR) TIMES DAILY BEFORE MEALS AND NIGHTLY.    . fluticasone (FLONASE) 50 MCG/ACT nasal spray Place into the nose.    Marland Kitchen Fluticasone-Salmeterol (ADVAIR DISKUS) 250-50 MCG/DOSE AEPB Inhale into the lungs.    Marland Kitchen liothyronine (CYTOMEL) 50 MCG tablet Take by mouth.    . loratadine (CLARITIN) 10 MG tablet Take by mouth.    . losartan-hydrochlorothiazide (HYZAAR) 100-25 MG per tablet TAKE 1 TABLET BY MOUTH DAILY.    . meloxicam (MOBIC) 15 MG tablet Take 15 mg by mouth. Take 1.5 tablets    . montelukast (SINGULAIR) 10 MG tablet TAKE 1 TABLET BY MOUTH NIGHTLY    . nystatin cream (MYCOSTATIN) Apply topically.    Marland Kitchen oxyCODONE (ROXICODONE) 15 MG immediate release tablet     . QUEtiapine (SEROQUEL) 25 MG tablet Take 3  tablets (75 mg total) by mouth at bedtime. 90 tablet 3  . ranitidine (ZANTAC) 150 MG capsule Take by mouth.    . sertraline (ZOLOFT) 100 MG tablet Take 1.5 tablets (150 mg total) by mouth daily. 135 tablet 0  . theophylline (UNIPHYL) 400 MG 24 hr tablet Take by mouth.    . tiotropium (SPIRIVA HANDIHALER) 18 MCG inhalation capsule Place into inhaler  and inhale.     No current facility-administered medications for this visit.     Medical Decision Making:  Established Problem, Stable/Improving (1) and Review of New Medication or Change in Dosage (2)  Treatment Plan Summary:Medication management and Plan      We will continue on Zoloft 150 mg in the morning for her depression and anxiety Continue Seroquel 75  mg at bedtime. Continue  Valium 5 mg by mouth daily at bedtime. Agreed with the plan. She was given 2 month supply of the medications.  She has supply of the other medications. She will  follow-up in 2 months or earlier depending on her symptoms   More than 50% of the time spent in psychoeducation, counseling and coordination of care.    This note was generated in part or whole with voice recognition software. Voice regonition is usually quite accurate but there are transcription errors that can and very often do occur. I apologize for any typographical errors that were not detected and corrected.    Rainey Pines, MD  06/17/2016, 12:58 PM

## 2016-07-30 NOTE — Discharge Instructions (Signed)

## 2016-08-01 ENCOUNTER — Ambulatory Visit: Payer: 59 | Admitting: Psychiatry

## 2016-08-04 ENCOUNTER — Encounter
Admission: RE | Disposition: A | Discharge status: Home / Self Care | Payer: Self-pay | Source: Ambulatory Visit | Attending: Ophthalmology

## 2016-08-04 ENCOUNTER — Ambulatory Visit
Admission: RE | Admit: 2016-08-04 | Discharge: 2016-08-04 | Disposition: A | Discharge status: Home / Self Care | Payer: Medicare Other | Source: Ambulatory Visit | Attending: Ophthalmology | Admitting: Ophthalmology

## 2016-08-04 ENCOUNTER — Ambulatory Visit: Payer: Medicare Other | Admitting: Anesthesiology

## 2016-08-04 DIAGNOSIS — J449 Chronic obstructive pulmonary disease, unspecified: Secondary | ICD-10-CM | POA: Diagnosis not present

## 2016-08-04 DIAGNOSIS — F17200 Nicotine dependence, unspecified, uncomplicated: Secondary | ICD-10-CM | POA: Diagnosis not present

## 2016-08-04 DIAGNOSIS — F329 Major depressive disorder, single episode, unspecified: Secondary | ICD-10-CM | POA: Insufficient documentation

## 2016-08-04 DIAGNOSIS — I10 Essential (primary) hypertension: Secondary | ICD-10-CM | POA: Diagnosis not present

## 2016-08-04 DIAGNOSIS — Z9981 Dependence on supplemental oxygen: Secondary | ICD-10-CM | POA: Insufficient documentation

## 2016-08-04 DIAGNOSIS — Z79899 Other long term (current) drug therapy: Secondary | ICD-10-CM | POA: Insufficient documentation

## 2016-08-04 DIAGNOSIS — H2512 Age-related nuclear cataract, left eye: Secondary | ICD-10-CM | POA: Insufficient documentation

## 2016-08-04 DIAGNOSIS — F419 Anxiety disorder, unspecified: Secondary | ICD-10-CM | POA: Insufficient documentation

## 2016-08-04 HISTORY — PX: CATARACT EXTRACTION W/PHACO: SHX586

## 2016-08-04 HISTORY — DX: Basal cell carcinoma of skin, unspecified: C44.91

## 2016-08-04 HISTORY — DX: Ventral hernia without obstruction or gangrene: K43.9

## 2016-08-04 HISTORY — DX: Other chronic pain: G89.29

## 2016-08-04 SURGERY — PHACOEMULSIFICATION, CATARACT, WITH IOL INSERTION
Anesthesia: Monitor Anesthesia Care | Site: Eye | Laterality: Left | Wound class: Clean

## 2016-08-04 MED ORDER — LACTATED RINGERS IV SOLN: INTRAVENOUS | Status: DC

## 2016-08-04 MED ORDER — MIDAZOLAM HCL 2 MG/2ML IJ SOLN
INTRAMUSCULAR | Status: DC | PRN
  Administered 2016-08-04: 2 mg via INTRAVENOUS

## 2016-08-04 MED ORDER — POLYMYXIN B-TRIMETHOPRIM 10000-0.1 UNIT/ML-% OP SOLN
1.0000 [drp] | OPHTHALMIC | Status: DC | PRN
  Administered 2016-08-04 (×3): 1 [drp] via OPHTHALMIC

## 2016-08-04 MED ORDER — BRIMONIDINE TARTRATE 0.2 % OP SOLN
OPHTHALMIC | Status: DC | PRN
Start: 2016-08-04 — End: 2016-08-04
  Administered 2016-08-04: 1 [drp] via OPHTHALMIC

## 2016-08-04 MED ORDER — TIMOLOL MALEATE 0.5 % OP SOLN
OPHTHALMIC | Status: DC | PRN
  Administered 2016-08-04: 1 [drp] via OPHTHALMIC

## 2016-08-04 MED ORDER — NA HYALUR & NA CHOND-NA HYALUR 0.4-0.35 ML IO KIT
PACK | INTRAOCULAR | Status: DC | PRN
  Administered 2016-08-04: 1 mL via INTRAOCULAR

## 2016-08-04 MED ORDER — ARMC OPHTHALMIC DILATING DROPS
1.0000 "application " | OPHTHALMIC | Status: DC | PRN
  Administered 2016-08-04 (×2): 1 via OPHTHALMIC

## 2016-08-04 MED ORDER — FENTANYL CITRATE (PF) 100 MCG/2ML IJ SOLN
INTRAMUSCULAR | Status: DC | PRN
  Administered 2016-08-04: 50 ug via INTRAVENOUS

## 2016-08-04 MED ORDER — CEFUROXIME OPHTHALMIC INJECTION 1 MG/0.1 ML
INJECTION | OPHTHALMIC | Status: DC | PRN
  Administered 2016-08-04: .3 mL via OPHTHALMIC

## 2016-08-04 MED ORDER — BALANCED SALT IO SOLN
INTRAOCULAR | Status: DC | PRN
  Administered 2016-08-04: 1 mL via OPHTHALMIC

## 2016-08-04 SURGICAL SUPPLY — 29 items
APPLICATOR COTTON TIP 3IN (MISCELLANEOUS) ×3 IMPLANT
CANNULA ANT/CHMB 27GA (MISCELLANEOUS) ×3 IMPLANT
DISSECTOR HYDRO NUCLEUS 50X22 (MISCELLANEOUS) ×3 IMPLANT
GLOVE BIO SURGEON STRL SZ7 (GLOVE) ×3 IMPLANT
GLOVE SURG LX 6.5 MICRO (GLOVE) ×2
GLOVE SURG LX STRL 6.5 MICRO (GLOVE) ×1 IMPLANT
GOWN STRL REUS W/ TWL LRG LVL3 (GOWN DISPOSABLE) ×2 IMPLANT
GOWN STRL REUS W/TWL LRG LVL3 (GOWN DISPOSABLE) ×4
LENS IOL ACRSF IQ ULTRA 19.0 (Intraocular Lens) ×1 IMPLANT
LENS IOL ACRYSOF IQ 19.0 (Intraocular Lens) ×3 IMPLANT
MARKER SKIN DUAL TIP RULER LAB (MISCELLANEOUS) ×3 IMPLANT
NEEDLE FILTER BLUNT 18X 1/2SAF (NEEDLE) ×2
NEEDLE FILTER BLUNT 18X1 1/2 (NEEDLE) ×1 IMPLANT
PACK CATARACT BRASINGTON (MISCELLANEOUS) ×3 IMPLANT
PACK EYE AFTER SURG (MISCELLANEOUS) ×3 IMPLANT
PACK OPTHALMIC (MISCELLANEOUS) ×3 IMPLANT
RING MALYGIN 7.0 (MISCELLANEOUS) IMPLANT
SOL BAL SALT 15ML (MISCELLANEOUS)
SOLUTION BAL SALT 15ML (MISCELLANEOUS) IMPLANT
SUT ETHILON 10-0 CS-B-6CS-B-6 (SUTURE)
SUT VICRYL  9 0 (SUTURE)
SUT VICRYL 9 0 (SUTURE) IMPLANT
SUTURE EHLN 10-0 CS-B-6CS-B-6 (SUTURE) IMPLANT
SYR 3ML LL SCALE MARK (SYRINGE) ×3 IMPLANT
SYR TB 1ML LUER SLIP (SYRINGE) ×3 IMPLANT
WATER STERILE IRR 250ML POUR (IV SOLUTION) ×3 IMPLANT
WATER STERILE IRR 500ML POUR (IV SOLUTION) IMPLANT
WICK EYE OCUCEL (MISCELLANEOUS) IMPLANT
WIPE NON LINTING 3.25X3.25 (MISCELLANEOUS) ×3 IMPLANT

## 2016-08-04 NOTE — H&P (Signed)
H+P reviewed and is up to date, please see paper chart.  

## 2016-08-04 NOTE — Anesthesia Procedure Notes (Signed)
Procedure Name: MAC Performed by: Patsie Mccardle Pre-anesthesia Checklist: Patient identified, Emergency Drugs available, Suction available, Timeout performed and Patient being monitored Patient Re-evaluated:Patient Re-evaluated prior to inductionOxygen Delivery Method: Nasal cannula Placement Confirmation: positive ETCO2     

## 2016-08-04 NOTE — Transfer of Care (Signed)
Immediate Anesthesia Transfer of Care Note  Patient: Shirley Alvarado  Procedure(s) Performed: Procedure(s) with comments: CATARACT EXTRACTION PHACO AND INTRAOCULAR LENS PLACEMENT (IOC) (Left) - LEFT  Patient Location: PACU  Anesthesia Type: MAC  Level of Consciousness: awake, alert  and patient cooperative  Airway and Oxygen Therapy: Patient Spontanous Breathing and Patient connected to supplemental oxygen  Post-op Assessment: Post-op Vital signs reviewed, Patient's Cardiovascular Status Stable, Respiratory Function Stable, Patent Airway and No signs of Nausea or vomiting  Post-op Vital Signs: Reviewed and stable  Complications: No apparent anesthesia complications

## 2016-08-04 NOTE — Anesthesia Postprocedure Evaluation (Signed)
Anesthesia Post Note  Patient: Shirley Alvarado  Procedure(s) Performed: Procedure(s) (LRB): CATARACT EXTRACTION PHACO AND INTRAOCULAR LENS PLACEMENT (IOC) (Left)  Patient location during evaluation: PACU Anesthesia Type: MAC Level of consciousness: awake and alert Pain management: pain level controlled Vital Signs Assessment: post-procedure vital signs reviewed and stable Respiratory status: spontaneous breathing, nonlabored ventilation, respiratory function stable and patient connected to nasal cannula oxygen Cardiovascular status: stable and blood pressure returned to baseline Anesthetic complications: no    Marshell Levan

## 2016-08-04 NOTE — Op Note (Signed)
Date of Surgery: 08/04/2016  PREOPERATIVE DIAGNOSES: Visually significant nuclear sclerotic cataract, left eye.  POSTOPERATIVE DIAGNOSES: Same  PROCEDURES PERFORMED: Cataract extraction with intraocular lens implant, left eye.  SURGEON: Almon Hercules, M.D.  ANESTHESIA: MAC and topical  IMPLANTS: [id number] +19.0 D  Implant Name Type Inv. Item Serial No. Manufacturer Lot No. LRB No. Used  LENS IOL ACRYSOF IQ 19.0 - [id number] Intraocular Lens LENS IOL ACRYSOF IQ 19.0 [id number] ALCON   Left 1    COMPLICATIONS: None.  DESCRIPTION OF PROCEDURE: Therapeutic options were discussed with the patient preoperatively, including a discussion of risks and benefits of surgery. Informed consent was obtained. An IOL-Master and immersion biometry were used to take the lens measurements, and a dilated fundus exam was performed within 6 months of the surgical date.  The patient was premedicated and brought to the operating room and placed on the operating table in the supine position. After adequate anesthesia, the patient was prepped and draped in the usual sterile ophthalmic fashion. A wire lid speculum was inserted and the microscope was positioned. A Superblade was used to create a paracentesis site at the limbus and a small amount of dilute preservative free lidocaine was instilled into the anterior chamber, followed by dispersive viscoelastic. A clear corneal incision was created temporally using a 2.4 mm keratome blade. Capsulorrhexis was then performed. In situ phacoemulsification was performed.  Cortical material was removed with the irrigation-aspiration unit. Dispersive viscoelastic was instilled to open the capsular bag. A posterior chamber intraocular lens with the specifications above was inserted and positioned. Irrigation-aspiration was used to remove all viscoelastic. Cefuroxime 1cc was instilled into the anterior chamber, and the corneal incision was checked and found to be water tight. The  eyelid speculum was removed.  The operative eye was covered with protective goggles after instilling 1 drop of timolol and brimonidine. The patient tolerated the procedure well. There were no complications.

## 2016-08-04 NOTE — Anesthesia Preprocedure Evaluation (Signed)
Anesthesia Evaluation  Patient identified by MRN, date of birth, ID band Patient awake    Airway Mallampati: III  TM Distance: >3 FB Neck ROM: Full    Dental   Pulmonary COPD,  oxygen dependent, Current Smoker,     + decreased breath sounds(-) wheezing      Cardiovascular hypertension, Pt. on medications Normal cardiovascular exam     Neuro/Psych PSYCHIATRIC DISORDERS Anxiety Depression    GI/Hepatic   Endo/Other    Renal/GU      Musculoskeletal   Abdominal   Peds  Hematology   Anesthesia Other Findings   Reproductive/Obstetrics                             Anesthesia Physical Anesthesia Plan  ASA: III  Anesthesia Plan: MAC   Post-op Pain Management:    Induction: Intravenous  Airway Management Planned:   Additional Equipment:   Intra-op Plan:   Post-operative Plan:   Informed Consent: I have reviewed the patients History and Physical, chart, labs and discussed the procedure including the risks, benefits and alternatives for the proposed anesthesia with the patient or authorized representative who has indicated his/her understanding and acceptance.     Plan Discussed with: CRNA  Anesthesia Plan Comments:         Anesthesia Quick Evaluation

## 2016-08-05 ENCOUNTER — Encounter: Payer: Self-pay | Admitting: Ophthalmology

## 2016-08-09 ENCOUNTER — Other Ambulatory Visit: Payer: Self-pay | Admitting: Psychiatry

## 2016-08-11 ENCOUNTER — Telehealth: Payer: Self-pay

## 2016-08-11 NOTE — Telephone Encounter (Signed)
Medication management - Telephone call with patient after she left a message to verify patient is scheduled for 08/15/16 at 11:15am and states has enough medication until evaluation appointment date.

## 2016-08-15 ENCOUNTER — Encounter: Payer: Self-pay | Admitting: Psychiatry

## 2016-08-15 ENCOUNTER — Ambulatory Visit (INDEPENDENT_AMBULATORY_CARE_PROVIDER_SITE_OTHER): Payer: 59 | Admitting: Psychiatry

## 2016-08-15 VITALS — BP 136/62 | HR 100 | Ht 61.0 in | Wt 152.2 lb

## 2016-08-15 DIAGNOSIS — F331 Major depressive disorder, recurrent, moderate: Secondary | ICD-10-CM

## 2016-08-15 DIAGNOSIS — F4001 Agoraphobia with panic disorder: Secondary | ICD-10-CM

## 2016-08-15 MED ORDER — SERTRALINE HCL 100 MG PO TABS: 150.0000 mg | ORAL_TABLET | Freq: Every day | ORAL | 0 refills | Status: DC

## 2016-08-15 MED ORDER — QUETIAPINE FUMARATE 25 MG PO TABS: 75.0000 mg | ORAL_TABLET | Freq: Every day | ORAL | 3 refills | Status: DC

## 2016-08-15 MED ORDER — DIAZEPAM 5 MG PO TABS: 5.0000 mg | ORAL_TABLET | Freq: Every day | ORAL | 1 refills | Status: DC

## 2016-08-15 NOTE — Progress Notes (Signed)
BH MD/PA/NP OP Progress Note  08/15/2016 11:14 AM Shirley Alvarado  MRN:  [id number]  Subjective:  Patient is a 65 year old female who presented for follow-up appointment.  She reported that she has been compliant with her medications. Patient stated that sometimes she is unable to sleep at night and she has to take Seroquel 100 mg. She reported that it has been helpful. Patient stated that she is not running out of her medications. She currently denied any side effects of the medications. She appeared calm and alert during the interview. She stated that she has been using oxygen on a regular basis. She was concerned about changing her housing towards the end of  the month and reported that her family will be helping her. She reported that she is uncertain about the same. Patient currently denied having any suicidal ideations or plans. She denied having any perceptual disturbances.  She appeared calm and alert during the interview. We discussed about her medications in detail and she agreed with the plan.     Chief Complaint:  Chief Complaint    Follow-up     Visit Diagnosis:   No diagnosis found.  Past Medical History:  Past Medical History:  Diagnosis Date  . Anxiety   . Basal cell carcinoma   . Cancer ([place])   . Chronic pain   . COPD (chronic obstructive pulmonary disease) ([place])   . Depression   . Hypertension   . Ventral hernia     Past Surgical History:  Procedure Laterality Date  . CATARACT EXTRACTION W/PHACO Left 08/04/2016   Procedure: CATARACT EXTRACTION PHACO AND INTRAOCULAR LENS PLACEMENT (IOC);  Surgeon: Ronnell Freshwater, MD;  Location: [facility];  Service: Ophthalmology;  Laterality: Left;  LEFT  . COLON SURGERY     Family History:  Family History  Problem Relation Age of Onset  . Lung cancer Mother   . Hypertension Mother   . Diabetes Mother   . Anxiety disorder Mother   . Depression Mother   . Hypertension Father   . Diabetes Father   .  Heart attack Father   . Depression Father   . Anxiety disorder Father   . Alcohol abuse Father   . Drug abuse Father   . Cancer Sister   . Diabetes Sister   . Hypertension Sister   . Obesity Sister   . Hypertension Brother   . Hypertension Brother   . Liver disease Brother   . Colon cancer Brother    Social History:  Social History   Social History  . Marital status: Widowed    Spouse name: N/A  . Number of children: N/A  . Years of education: N/A   Social History Main Topics  . Smoking status: Current Every Day Smoker    Packs/day: 0.50    Years: 50.00    Types: Cigarettes    Start date: 05/21/1970  . Smokeless tobacco: Never Used     Comment: However patient relates that she smoked for 50 years and at one point she was up to 2 packs per day  . Alcohol use No  . Drug use: No  . Sexual activity: Not Currently   Other Topics Concern  . None   Social History Narrative  . None   Additional History:   Assessment:   Musculoskeletal: Strength & Muscle Tone: within normal limits Gait & Station: Slow  Patient leans: N/A  Psychiatric Specialty Exam: Medication Refill   Anxiety  Symptoms include nervous/anxious behavior. Patient  reports no insomnia or suicidal ideas.      Review of Systems  Psychiatric/Behavioral: Positive for depression and substance abuse. Negative for hallucinations, memory loss and suicidal ideas. The patient is nervous/anxious. The patient does not have insomnia.   All other systems reviewed and are negative.   Blood pressure 136/62, pulse 100, height 5\' 1"  (1.549 m), weight 152 lb 3.2 oz (69 kg).Body mass index is 28.76 kg/m.  General Appearance: Fairly Groomed  Eye Contact:  Good  Speech:  Normal Rate  Volume:  Normal  Mood:  Euthymic  Affect:  Appropriate  Thought Process:  Linear  Orientation:  Full (Time, Place, and Person)  Thought Content:  WDL  Suicidal Thoughts:  No  Homicidal Thoughts:  No  Memory:  Immediate;    Good Recent;   Good Remote;   Good  Judgement:  Good  Insight:  Good  Psychomotor Activity:  Normal  Concentration:  Good  Recall:  Good  Fund of Knowledge: Good  Language: Good  Akathisia:  Negative  Handed:  Right unknown   AIMS (if indicated):  N/A  Assets:  Communication Skills Desire for Improvement  ADL's:  Intact  Cognition: WNL  Sleep:  good   Is the patient at risk to self?  No. Has the patient been a risk to self in the past 6 months?  No. Has the patient been a risk to self within the distant past?  No. Is the patient a risk to others?  No. Has the patient been a risk to others in the past 6 months?  No. Has the patient been a risk to others within the distant past?  No.  Current Medications: Current Outpatient Prescriptions  Medication Sig Dispense Refill  . albuterol (PROAIR HFA) 108 (90 BASE) MCG/ACT inhaler Inhale into the lungs.    Marland Kitchen amLODipine (NORVASC) 10 MG tablet Take by mouth.    Marland Kitchen aspirin EC 81 MG tablet Take 81 mg by mouth daily.    . diazepam (VALIUM) 5 MG tablet Take 1 tablet (5 mg total) by mouth at bedtime. 30 tablet 1  . diazepam (VALIUM) 5 MG tablet Take 1 tablet (5 mg total) by mouth at bedtime. 30 tablet 1  . dicyclomine (BENTYL) 10 MG capsule TAKE 1 CAPSULE (10 MG TOTAL) BY MOUTH 4 (FOUR) TIMES DAILY BEFORE MEALS AND NIGHTLY.    . fentaNYL (DURAGESIC - DOSED MCG/HR) 75 MCG/HR Place 25 mcg onto the skin every 3 (three) days.    . fluticasone (FLONASE) 50 MCG/ACT nasal spray Place into the nose.    Marland Kitchen Fluticasone-Salmeterol (ADVAIR DISKUS) 250-50 MCG/DOSE AEPB Inhale into the lungs.    Marland Kitchen liothyronine (CYTOMEL) 50 MCG tablet Take by mouth.    . loratadine (CLARITIN) 10 MG tablet Take by mouth.    . losartan-hydrochlorothiazide (HYZAAR) 100-25 MG per tablet TAKE 1 TABLET BY MOUTH DAILY.    . meloxicam (MOBIC) 15 MG tablet Take 15 mg by mouth. Take 1.5 tablets    . montelukast (SINGULAIR) 10 MG tablet TAKE 1 TABLET BY MOUTH NIGHTLY    . nystatin  cream (MYCOSTATIN) Apply topically.    Marland Kitchen oxyCODONE (ROXICODONE) 15 MG immediate release tablet     . QUEtiapine (SEROQUEL) 25 MG tablet Take 3 tablets (75 mg total) by mouth at bedtime. 90 tablet 3  . ranitidine (ZANTAC) 150 MG capsule Take by mouth.    . sertraline (ZOLOFT) 100 MG tablet Take 1.5 tablets (150 mg total) by mouth daily. 135 tablet 0  .  theophylline (UNIPHYL) 400 MG 24 hr tablet Take by mouth.    . tiotropium (SPIRIVA HANDIHALER) 18 MCG inhalation capsule Place into inhaler and inhale.     No current facility-administered medications for this visit.     Medical Decision Making:  Established Problem, Stable/Improving (1) and Review of New Medication or Change in Dosage (2)  Treatment Plan Summary:Medication management and Plan      We will continue on Zoloft 150 mg in the morning for her depression and anxiety Continue Seroquel 75  mg at bedtime. Continue  Valium 5 mg by mouth daily at bedtime. Agreed with the plan. She was given 2 month supply of the medications.     More than 50% of the time spent in psychoeducation, counseling and coordination of care.    This note was generated in part or whole with voice recognition software. Voice regonition is usually quite accurate but there are transcription errors that can and very often do occur. I apologize for any typographical errors that were not detected and corrected.    Rainey Pines, MD  08/15/2016, 11:14 AM

## 2016-09-12 NOTE — Progress Notes (Signed)
Called pharmacy rx [facility] [place]

## 2016-09-12 NOTE — Progress Notes (Signed)
Call pharmacy the rx [facility] [place]

## 2016-09-25 ENCOUNTER — Other Ambulatory Visit: Payer: Self-pay

## 2016-09-25 NOTE — Telephone Encounter (Signed)
pt needs a rx for valium she will run out 10-14-16. pt needs rx [facility] [place].

## 2016-09-26 ENCOUNTER — Telehealth: Payer: Self-pay

## 2016-09-26 MED ORDER — DIAZEPAM 5 MG PO TABS: 5.0000 mg | ORAL_TABLET | Freq: Every day | ORAL | 0 refills | Status: DC

## 2016-09-26 NOTE — Telephone Encounter (Signed)
faxed and confirmed rx for valium 5mg  id # [id number] order # [id number]

## 2016-10-29 ENCOUNTER — Ambulatory Visit: Payer: 59 | Admitting: Psychiatry

## 2016-10-29 ENCOUNTER — Ambulatory Visit (INDEPENDENT_AMBULATORY_CARE_PROVIDER_SITE_OTHER): Payer: 59 | Admitting: Psychiatry

## 2016-10-29 ENCOUNTER — Encounter: Payer: Self-pay | Admitting: Psychiatry

## 2016-10-29 VITALS — BP 138/74 | HR 102 | Temp 98.2°F | Wt 150.2 lb

## 2016-10-29 DIAGNOSIS — F331 Major depressive disorder, recurrent, moderate: Secondary | ICD-10-CM

## 2016-10-29 DIAGNOSIS — F4001 Agoraphobia with panic disorder: Secondary | ICD-10-CM | POA: Diagnosis not present

## 2016-10-29 MED ORDER — QUETIAPINE FUMARATE 100 MG PO TABS: 100.0000 mg | ORAL_TABLET | Freq: Every day | ORAL | 2 refills | Status: DC

## 2016-10-29 MED ORDER — SERTRALINE HCL 100 MG PO TABS: 150.0000 mg | ORAL_TABLET | Freq: Every day | ORAL | 0 refills | Status: DC

## 2016-10-29 MED ORDER — DIAZEPAM 5 MG PO TABS: 5.0000 mg | ORAL_TABLET | Freq: Every day | ORAL | 1 refills | Status: DC

## 2016-10-29 NOTE — Progress Notes (Signed)
BH MD/PA/NP OP Progress Note  10/29/2016 11:40 AM Shirley Alvarado  MRN:  [id number]  Subjective:  Patient is a 66 year old female who presented for follow-up appointment.  She reported that he continues to feel anxious and is compliant with her medications. She reported that she has problems with sleep and wants to go higher on the dose of the Seroquel. She reported that she is not having any side effects of the medications. We discussed about her medications in detail. She stated that she lays in bed for long hours and watch TV in the bed. She currently denied having any suicidal homicidal ideations or plans. She appeared calm and alert during the interview. She reported that she has moved to another house and is happy as she is living on her own. She denied having any perceptual disturbances. She is using oxygen tank as usual. She does not want to decrease the dose of Valium at this time.   She appeared calm and alert during the interview. We discussed about her medications in detail and she agreed with the plan.     Chief Complaint:  Chief Complaint    Follow-up; Medication Refill     Visit Diagnosis:   No diagnosis found.  Past Medical History:  Past Medical History:  Diagnosis Date  . Anxiety   . Basal cell carcinoma   . Cancer ([place])   . Chronic pain   . COPD (chronic obstructive pulmonary disease) ([place])   . Depression   . Hypertension   . Ventral hernia     Past Surgical History:  Procedure Laterality Date  . CATARACT EXTRACTION W/PHACO Left 08/04/2016   Procedure: CATARACT EXTRACTION PHACO AND INTRAOCULAR LENS PLACEMENT (IOC);  Surgeon: Ronnell Freshwater, MD;  Location: [facility];  Service: Ophthalmology;  Laterality: Left;  LEFT  . COLON SURGERY     Family History:  Family History  Problem Relation Age of Onset  . Lung cancer Mother   . Hypertension Mother   . Diabetes Mother   . Anxiety disorder Mother   . Depression Mother   . Hypertension  Father   . Diabetes Father   . Heart attack Father   . Depression Father   . Anxiety disorder Father   . Alcohol abuse Father   . Drug abuse Father   . Cancer Sister   . Diabetes Sister   . Hypertension Sister   . Obesity Sister   . Hypertension Brother   . Hypertension Brother   . Liver disease Brother   . Colon cancer Brother    Social History:  Social History   Social History  . Marital status: Widowed    Spouse name: N/A  . Number of children: N/A  . Years of education: N/A   Social History Main Topics  . Smoking status: Current Every Day Smoker    Packs/day: 0.50    Years: 50.00    Types: Cigarettes    Start date: 05/21/1970  . Smokeless tobacco: Never Used     Comment: However patient relates that she smoked for 50 years and at one point she was up to 2 packs per day  . Alcohol use No  . Drug use: No  . Sexual activity: Not Currently   Other Topics Concern  . None   Social History Narrative  . None   Additional History:   Assessment:   Musculoskeletal: Strength & Muscle Tone: within normal limits Gait & Station: Slow  Patient leans: N/A  Psychiatric Specialty  Exam: Medication Refill   Anxiety  Symptoms include nervous/anxious behavior. Patient reports no insomnia or suicidal ideas.      Review of Systems  Psychiatric/Behavioral: Positive for depression and substance abuse. Negative for hallucinations, memory loss and suicidal ideas. The patient is nervous/anxious. The patient does not have insomnia.   All other systems reviewed and are negative.   Blood pressure 138/74, pulse (!) 102, temperature 98.2 F (36.8 C), temperature source Oral, weight 150 lb 3.2 oz (68.1 kg), SpO2 95 %.Body mass index is 28.38 kg/m.  General Appearance: Fairly Groomed  Eye Contact:  Good  Speech:  Normal Rate  Volume:  Normal  Mood:  Euthymic  Affect:  Appropriate  Thought Process:  Linear  Orientation:  Full (Time, Place, and Person)  Thought Content:  WDL   Suicidal Thoughts:  No  Homicidal Thoughts:  No  Memory:  Immediate;   Good Recent;   Good Remote;   Good  Judgement:  Good  Insight:  Good  Psychomotor Activity:  Normal  Concentration:  Good  Recall:  Good  Fund of Knowledge: Good  Language: Good  Akathisia:  Negative  Handed:  Right unknown   AIMS (if indicated):  N/A  Assets:  Communication Skills Desire for Improvement  ADL's:  Intact  Cognition: WNL  Sleep:  good   Is the patient at risk to self?  No. Has the patient been a risk to self in the past 6 months?  No. Has the patient been a risk to self within the distant past?  No. Is the patient a risk to others?  No. Has the patient been a risk to others in the past 6 months?  No. Has the patient been a risk to others within the distant past?  No.  Current Medications: Current Outpatient Prescriptions  Medication Sig Dispense Refill  . albuterol (PROAIR HFA) 108 (90 BASE) MCG/ACT inhaler Inhale into the lungs.    Marland Kitchen amLODipine (NORVASC) 10 MG tablet Take by mouth.    Marland Kitchen aspirin EC 81 MG tablet Take 81 mg by mouth daily.    . diazepam (VALIUM) 5 MG tablet Take 1 tablet (5 mg total) by mouth at bedtime. 30 tablet 0  . dicyclomine (BENTYL) 10 MG capsule TAKE 1 CAPSULE (10 MG TOTAL) BY MOUTH 4 (FOUR) TIMES DAILY BEFORE MEALS AND NIGHTLY.    . fentaNYL (DURAGESIC - DOSED MCG/HR) 75 MCG/HR Place 25 mcg onto the skin every 3 (three) days.    . Fluticasone-Salmeterol (ADVAIR DISKUS) 250-50 MCG/DOSE AEPB Inhale into the lungs.    Marland Kitchen loratadine (CLARITIN) 10 MG tablet Take by mouth.    . losartan-hydrochlorothiazide (HYZAAR) 100-25 MG per tablet TAKE 1 TABLET BY MOUTH DAILY.    . montelukast (SINGULAIR) 10 MG tablet TAKE 1 TABLET BY MOUTH NIGHTLY    . nystatin cream (MYCOSTATIN) Apply topically.    Marland Kitchen oxyCODONE (ROXICODONE) 15 MG immediate release tablet     . QUEtiapine (SEROQUEL) 25 MG tablet Take 3 tablets (75 mg total) by mouth at bedtime. 90 tablet 3  . QUEtiapine (SEROQUEL)  25 MG tablet Take 3 tablets (75 mg total) by mouth at bedtime. 90 tablet 3  . ranitidine (ZANTAC) 150 MG capsule Take by mouth.    . sertraline (ZOLOFT) 100 MG tablet Take 1.5 tablets (150 mg total) by mouth daily. 135 tablet 0  . tiotropium (SPIRIVA HANDIHALER) 18 MCG inhalation capsule Place into inhaler and inhale.    . fluticasone (FLONASE) 50 MCG/ACT nasal spray Place  into the nose.    . liothyronine (CYTOMEL) 50 MCG tablet Take by mouth.    . theophylline (UNIPHYL) 400 MG 24 hr tablet Take by mouth.     No current facility-administered medications for this visit.     Medical Decision Making:  Established Problem, Stable/Improving (1) and Review of New Medication or Change in Dosage (2)  Treatment Plan Summary:Medication management and Plan      We will continue on Zoloft 150 mg in the morning for her depression and anxiety Continue Seroquel 100 mg at bedtime. Continue  Valium 5 mg by mouth daily at bedtime. Agreed with the plan. She was given 2 month supply of the medications.     More than 50% of the time spent in psychoeducation, counseling and coordination of care.    This note was generated in part or whole with voice recognition software. Voice regonition is usually quite accurate but there are transcription errors that can and very often do occur. I apologize for any typographical errors that were not detected and corrected.    Rainey Pines, MD  10/29/2016, 11:40 AM

## 2016-12-22 ENCOUNTER — Telehealth: Payer: Self-pay

## 2016-12-22 NOTE — Telephone Encounter (Signed)
pt called today states that she does not have transportation to her appt that is schuduled for 12-24-16.  pt last seen you on  10-29-16.  pt states she is doing very well and she did not know if you would do a phone consult or if you would just send her in a couple of refills.

## 2016-12-24 ENCOUNTER — Other Ambulatory Visit: Payer: Self-pay | Admitting: Psychiatry

## 2016-12-24 ENCOUNTER — Ambulatory Visit: Payer: 59 | Admitting: Psychiatry

## 2016-12-24 MED ORDER — QUETIAPINE FUMARATE 100 MG PO TABS: 100.0000 mg | ORAL_TABLET | Freq: Every day | ORAL | 2 refills | Status: DC

## 2016-12-24 MED ORDER — SERTRALINE HCL 100 MG PO TABS: 150.0000 mg | ORAL_TABLET | Freq: Every day | ORAL | 0 refills | Status: DC

## 2016-12-24 MED ORDER — DIAZEPAM 5 MG PO TABS: 5.0000 mg | ORAL_TABLET | Freq: Every day | ORAL | 1 refills | Status: DC

## 2016-12-24 NOTE — Telephone Encounter (Signed)
Pt was called and told that dr. Gretel Acre sent in a 2 month refills and that she wanted to see her back in two months. Pt states just to mail her an appt card.

## 2016-12-24 NOTE — Telephone Encounter (Signed)
pt states that she does not have any transportation and would like to find out if you would do a phone visit.  and she needs refills on her medication.  she is doing well

## 2016-12-24 NOTE — Telephone Encounter (Signed)
Meds refilled x 2 months. Ask her to f/u in 2 months

## 2017-02-23 ENCOUNTER — Ambulatory Visit: Payer: 59 | Admitting: Psychiatry

## 2017-02-23 ENCOUNTER — Ambulatory Visit (INDEPENDENT_AMBULATORY_CARE_PROVIDER_SITE_OTHER): Payer: 59 | Admitting: Psychiatry

## 2017-02-23 ENCOUNTER — Encounter: Payer: Self-pay | Admitting: Psychiatry

## 2017-02-23 VITALS — BP 122/69 | HR 94 | Temp 98.6°F | Wt 150.0 lb

## 2017-02-23 DIAGNOSIS — F331 Major depressive disorder, recurrent, moderate: Secondary | ICD-10-CM

## 2017-02-23 DIAGNOSIS — F4001 Agoraphobia with panic disorder: Secondary | ICD-10-CM | POA: Diagnosis not present

## 2017-02-23 MED ORDER — SERTRALINE HCL 100 MG PO TABS: 150.0000 mg | ORAL_TABLET | Freq: Every day | ORAL | 0 refills | Status: DC

## 2017-02-23 MED ORDER — DIAZEPAM 5 MG PO TABS: 5.0000 mg | ORAL_TABLET | Freq: Every day | ORAL | 2 refills | Status: DC

## 2017-02-23 MED ORDER — QUETIAPINE FUMARATE 50 MG PO TABS: 50.0000 mg | ORAL_TABLET | Freq: Every day | ORAL | 1 refills | Status: DC

## 2017-02-23 NOTE — Progress Notes (Signed)
BH MD/PA/NP OP Progress Note  02/23/2017 12:20 PM Shirley Alvarado  MRN:  [id number]  Subjective:  Patient is a 66 year old female who presented for follow-up appointment.  She reported that  she has been doing better. She currently walks with the help of the oxygen tank. She stated that she is doing well on her medications and she wants to decrease the dose of the Seroquel as she feels that the medications are helping her and her mood is improving. She reported that she does not feel depressed at this time. She has stopped watching TV at night and she is improving. She appeared calm and alert during the interview. She denied having any suicidal homicidal ideations or plans. Her mood symptoms are also improving. She appears more alert and receptive to her medications. She stated that she takes half of Valium in the morning and half a pill at night. She does not want to stop taking the Wellbutrin at this time. She stated that her family members help her with the transportation for her appointments. We discussed about her medications at length.       Chief Complaint:  Chief Complaint    Follow-up; Medication Refill     Visit Diagnosis:     ICD-9-CM ICD-10-CM   1. Major depressive disorder, recurrent episode, moderate (HCC) 296.32 F33.1   2. Panic disorder with agoraphobia and mild panic attacks 300.21 F40.01     Past Medical History:  Past Medical History:  Diagnosis Date  . Anxiety   . Basal cell carcinoma   . Cancer ([place])   . Chronic pain   . COPD (chronic obstructive pulmonary disease) ([place])   . Depression   . Hypertension   . Ventral hernia     Past Surgical History:  Procedure Laterality Date  . CATARACT EXTRACTION W/PHACO Left 08/04/2016   Procedure: CATARACT EXTRACTION PHACO AND INTRAOCULAR LENS PLACEMENT (IOC);  Surgeon: Ronnell Freshwater, MD;  Location: [facility];  Service: Ophthalmology;  Laterality: Left;  LEFT  . COLON SURGERY     Family History:   Family History  Problem Relation Age of Onset  . Lung cancer Mother   . Hypertension Mother   . Diabetes Mother   . Anxiety disorder Mother   . Depression Mother   . Hypertension Father   . Diabetes Father   . Heart attack Father   . Depression Father   . Anxiety disorder Father   . Alcohol abuse Father   . Drug abuse Father   . Cancer Sister   . Diabetes Sister   . Hypertension Sister   . Obesity Sister   . Hypertension Brother   . Hypertension Brother   . Liver disease Brother   . Colon cancer Brother    Social History:  Social History   Social History  . Marital status: Widowed    Spouse name: N/A  . Number of children: N/A  . Years of education: N/A   Social History Main Topics  . Smoking status: Current Every Day Smoker    Packs/day: 0.50    Years: 50.00    Types: Cigarettes    Start date: 05/21/1970  . Smokeless tobacco: Never Used     Comment: However patient relates that she smoked for 50 years and at one point she was up to 2 packs per day  . Alcohol use No  . Drug use: No  . Sexual activity: Not Currently   Other Topics Concern  . None   Social  History Narrative  . None   Additional History:   Assessment:   Musculoskeletal: Strength & Muscle Tone: within normal limits Gait & Station: Slow  Patient leans: N/A  Psychiatric Specialty Exam: Medication Refill   Anxiety  Symptoms include nervous/anxious behavior. Patient reports no insomnia or suicidal ideas.      Review of Systems  Psychiatric/Behavioral: Positive for depression and substance abuse. Negative for hallucinations, memory loss and suicidal ideas. The patient is nervous/anxious. The patient does not have insomnia.   All other systems reviewed and are negative.   Blood pressure 122/69, pulse 94, temperature 98.6 F (37 C), temperature source Oral, weight 150 lb (68 kg), SpO2 97 %.Body mass index is 28.34 kg/m.  General Appearance: Fairly Groomed  Eye Contact:  Good  Speech:   Normal Rate  Volume:  Normal  Mood:  Euthymic  Affect:  Appropriate  Thought Process:  Linear  Orientation:  Full (Time, Place, and Person)  Thought Content:  WDL  Suicidal Thoughts:  No  Homicidal Thoughts:  No  Memory:  Immediate;   Good Recent;   Good Remote;   Good  Judgement:  Good  Insight:  Good  Psychomotor Activity:  Normal  Concentration:  Good  Recall:  Good  Fund of Knowledge: Good  Language: Good  Akathisia:  Negative  Handed:  Right unknown   AIMS (if indicated):  N/A  Assets:  Communication Skills Desire for Improvement  ADL's:  Intact  Cognition: WNL  Sleep:  good   Is the patient at risk to self?  No. Has the patient been a risk to self in the past 6 months?  No. Has the patient been a risk to self within the distant past?  No. Is the patient a risk to others?  No. Has the patient been a risk to others in the past 6 months?  No. Has the patient been a risk to others within the distant past?  No.  Current Medications: Current Outpatient Prescriptions  Medication Sig Dispense Refill  . albuterol (PROAIR HFA) 108 (90 BASE) MCG/ACT inhaler Inhale into the lungs.    Marland Kitchen amLODipine (NORVASC) 10 MG tablet Take by mouth.    Marland Kitchen aspirin EC 81 MG tablet Take 81 mg by mouth daily.    . diazepam (VALIUM) 5 MG tablet Take 1 tablet (5 mg total) by mouth at bedtime. 30 tablet 2  . dicyclomine (BENTYL) 10 MG capsule TAKE 1 CAPSULE (10 MG TOTAL) BY MOUTH 4 (FOUR) TIMES DAILY BEFORE MEALS AND NIGHTLY.    . fentaNYL (DURAGESIC - DOSED MCG/HR) 75 MCG/HR Place 25 mcg onto the skin every 3 (three) days.    . Fluticasone-Salmeterol (ADVAIR DISKUS) 250-50 MCG/DOSE AEPB Inhale into the lungs.    Marland Kitchen loratadine (CLARITIN) 10 MG tablet Take by mouth.    . losartan-hydrochlorothiazide (HYZAAR) 100-25 MG per tablet TAKE 1 TABLET BY MOUTH DAILY.    . montelukast (SINGULAIR) 10 MG tablet TAKE 1 TABLET BY MOUTH NIGHTLY    . nystatin cream (MYCOSTATIN) Apply topically.    Marland Kitchen oxyCODONE  (ROXICODONE) 15 MG immediate release tablet     . QUEtiapine (SEROQUEL) 50 MG tablet Take 1 tablet (50 mg total) by mouth at bedtime. 90 tablet 1  . ranitidine (ZANTAC) 150 MG capsule Take by mouth.    . sertraline (ZOLOFT) 100 MG tablet Take 1.5 tablets (150 mg total) by mouth daily. 135 tablet 0  . tiotropium (SPIRIVA HANDIHALER) 18 MCG inhalation capsule Place into inhaler and inhale.    Marland Kitchen  fluticasone (FLONASE) 50 MCG/ACT nasal spray Place into the nose.    . liothyronine (CYTOMEL) 50 MCG tablet Take by mouth.    . theophylline (UNIPHYL) 400 MG 24 hr tablet Take by mouth.     No current facility-administered medications for this visit.     Medical Decision Making:  Established Problem, Stable/Improving (1) and Review of New Medication or Change in Dosage (2)  Treatment Plan Summary:Medication management and Plan      We will continue on Zoloft 150 mg in the morning for her depression and anxiety Continue Seroquel 50  mg at bedtime. Continue  Valium 5 mg by mouth daily at bedtime. Agreed with the plan. She was given  3 month supply of the medications.     More than 50% of the time spent in psychoeducation, counseling and coordination of care.    This note was generated in part or whole with voice recognition software. Voice regonition is usually quite accurate but there are transcription errors that can and very often do occur. I apologize for any typographical errors that were not detected and corrected.    Rainey Pines, MD  02/23/2017, 12:20 PM

## 2017-03-24 ENCOUNTER — Emergency Department: Payer: Medicare Other

## 2017-03-24 ENCOUNTER — Emergency Department
Admission: EM | Admit: 2017-03-24 | Discharge: 2017-03-24 | Disposition: A | Discharge status: Home / Self Care | Payer: Medicare Other | Attending: Emergency Medicine | Admitting: Emergency Medicine

## 2017-03-24 DIAGNOSIS — Z85038 Personal history of other malignant neoplasm of large intestine: Secondary | ICD-10-CM | POA: Insufficient documentation

## 2017-03-24 DIAGNOSIS — L03319 Cellulitis of trunk, unspecified: Secondary | ICD-10-CM | POA: Insufficient documentation

## 2017-03-24 DIAGNOSIS — F17210 Nicotine dependence, cigarettes, uncomplicated: Secondary | ICD-10-CM | POA: Diagnosis not present

## 2017-03-24 DIAGNOSIS — I10 Essential (primary) hypertension: Secondary | ICD-10-CM | POA: Diagnosis not present

## 2017-03-24 DIAGNOSIS — Z79899 Other long term (current) drug therapy: Secondary | ICD-10-CM | POA: Diagnosis not present

## 2017-03-24 DIAGNOSIS — R109 Unspecified abdominal pain: Secondary | ICD-10-CM | POA: Diagnosis present

## 2017-03-24 DIAGNOSIS — Z85828 Personal history of other malignant neoplasm of skin: Secondary | ICD-10-CM | POA: Insufficient documentation

## 2017-03-24 DIAGNOSIS — J449 Chronic obstructive pulmonary disease, unspecified: Secondary | ICD-10-CM | POA: Diagnosis not present

## 2017-03-24 LAB — CBC WITH DIFFERENTIAL/PLATELET
Basophils Absolute: 0.1 10*3/uL (ref 0–0.1)
Basophils Relative: 1 %
Eosinophils Absolute: 0.2 10*3/uL (ref 0–0.7)
Eosinophils Relative: 3 %
HCT: 39.8 % (ref 35.0–47.0)
Hemoglobin: 13.7 g/dL (ref 12.0–16.0)
Lymphocytes Relative: 31 %
Lymphs Abs: 1.8 10*3/uL (ref 1.0–3.6)
MCH: 33.1 pg (ref 26.0–34.0)
MCHC: 34.5 g/dL (ref 32.0–36.0)
MCV: 95.9 fL (ref 80.0–100.0)
Monocytes Absolute: 0.5 10*3/uL (ref 0.2–0.9)
Monocytes Relative: 9 %
Neutro Abs: 3.3 10*3/uL (ref 1.4–6.5)
Neutrophils Relative %: 56 %
Platelets: 344 10*3/uL (ref 150–440)
RBC: 4.15 MIL/uL (ref 3.80–5.20)
RDW: 13.4 % (ref 11.5–14.5)
WBC: 6 10*3/uL (ref 3.6–11.0)

## 2017-03-24 LAB — COMPREHENSIVE METABOLIC PANEL
ALT: 12 U/L — ABNORMAL LOW (ref 14–54)
AST: 20 U/L (ref 15–41)
Albumin: 4.5 g/dL (ref 3.5–5.0)
Alkaline Phosphatase: 72 U/L (ref 38–126)
Anion gap: 10 (ref 5–15)
BUN: 7 mg/dL (ref 6–20)
CO2: 34 mmol/L — ABNORMAL HIGH (ref 22–32)
Calcium: 9.7 mg/dL (ref 8.9–10.3)
Chloride: 87 mmol/L — ABNORMAL LOW (ref 101–111)
Creatinine, Ser: <0.3 mg/dL — ABNORMAL LOW (ref 0.44–1.00)
Glucose, Bld: 90 mg/dL (ref 65–99)
Potassium: 3.4 mmol/L — ABNORMAL LOW (ref 3.5–5.1)
Sodium: 131 mmol/L — ABNORMAL LOW (ref 135–145)
Total Bilirubin: 0.4 mg/dL (ref 0.3–1.2)
Total Protein: 7.7 g/dL (ref 6.5–8.1)

## 2017-03-24 LAB — LIPASE, BLOOD: Lipase: 22 U/L (ref 11–51)

## 2017-03-24 LAB — TROPONIN I: Troponin I: <0.03 ng/mL (ref <0.03)

## 2017-03-24 MED ORDER — SODIUM CHLORIDE 0.9 % IV BOLUS (SEPSIS)
500.0000 mL | Freq: Once | INTRAVENOUS | Status: AC
  Administered 2017-03-24: 500 mL via INTRAVENOUS

## 2017-03-24 MED ORDER — IOPAMIDOL (ISOVUE-300) INJECTION 61%
100.0000 mL | Freq: Once | INTRAVENOUS | Status: AC | PRN
  Administered 2017-03-24: 100 mL via INTRAVENOUS

## 2017-03-24 MED ORDER — SULFAMETHOXAZOLE-TRIMETHOPRIM 800-160 MG PO TABS
2.0000 | ORAL_TABLET | Freq: Once | ORAL | Status: AC
  Administered 2017-03-24: 2 via ORAL
  Filled 2017-03-24: qty 2

## 2017-03-24 MED ORDER — IOPAMIDOL (ISOVUE-300) INJECTION 61%
30.0000 mL | Freq: Once | INTRAVENOUS | Status: AC
  Administered 2017-03-24: 30 mL via ORAL

## 2017-03-24 MED ORDER — SULFAMETHOXAZOLE-TRIMETHOPRIM 800-160 MG PO TABS: 2.0000 | ORAL_TABLET | Freq: Two times a day (BID) | ORAL | 0 refills | Status: DC

## 2017-03-24 NOTE — ED Notes (Signed)
Pt presents with acute on chronic abdominal pain.States she had surgery in 2005 for colon cancer and some mesh in the abdomen periodically "breaks open" and she has a herniation. She states she has had a recent ct scan for the same but states that this cannot be repaired because she is not a surgical candidate because of her lungs. Pt is 2-3L oxygen chronically. Pt speaking breathlessly and in obvious pain.

## 2017-03-24 NOTE — ED Triage Notes (Signed)
Pt c/o having some pain around an old abd pain for a couple of days, today while cleaning sudden has opening at the scar . States "it feels like my guts are coming out"

## 2017-03-24 NOTE — ED Provider Notes (Signed)
[facility] Emergency Department Provider Note  ____________________________________________   First MD Initiated Contact with Patient 03/24/17 1730     (approximate)  I have reviewed the triage vital signs and the nursing notes.   HISTORY  Chief Complaint Abscess and Abdominal Pain   HPI Shirley Alvarado is a 66 y.o. female for the history of a remote abdominal surgery for colectomy with subsequent chronic ventral hernia who is presenting with bloody drainage from her incision site. She says that she frequently has pain in her abdomen but over the past day she has noticed an open site of bleeding along with worsening pain. She says that she is also had nausea but no vomiting. Able to pass stool without any diarrhea. Denies any blood in her stool. Says the pain is moderate. Was evaluated for surgery several months ago at [facility] and was told that she would be a poor surgical candidate due to cardiopulmonary compromise. Patient is on a baseline of 2-3 L of nasal cannula oxygen and says that she is not having any increased work of breathing at this time.   Past Medical History:  Diagnosis Date  . Anxiety   . Basal cell carcinoma   . Cancer ([place])   . Chronic pain   . COPD (chronic obstructive pulmonary disease) ([place])   . Depression   . Hypertension   . Ventral hernia     Patient Active Problem List   Diagnosis Date Noted  . LBP (low back pain) 12/24/2015  . Infected hernioplasty mesh ([place])   . Pulmonary emphysema ([place])   . Other long term (current) drug therapy 07/06/2015  . Long term current use of opiate analgesic 07/06/2015  . 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 Foot pain 10/12/2014  . Plantar verruca 10/12/2014  . Continuous opioid dependence ([place]) 06/09/2014  . Advance directive discussed with patient 04/11/2013  . 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 Grade 3 vulvar intraepithelial  neoplasia 02/07/2013  . Adult BMI 30+ 11/23/2012  . Lung mass 11/23/2012  . Compulsive tobacco user syndrome 11/23/2012  . Current tobacco use 11/23/2012  . Chronic pain 04/27/2012  . Cancer of colon ([place]) 07/29/2011  . CAFL (chronic airflow limitation) ([place]) 07/29/2011  . Clinical depression 07/29/2011  . BP (high blood pressure) 07/29/2011  . Hernia of anterior abdominal wall 07/29/2011  . Malignant neoplasm of colon ([place]) 07/29/2011  . Chronic obstructive pulmonary disease ([place]) 07/29/2011    Past Surgical History:  Procedure Laterality Date  . CATARACT EXTRACTION W/PHACO Left 08/04/2016   Procedure: CATARACT EXTRACTION PHACO AND INTRAOCULAR LENS PLACEMENT (IOC);  Surgeon: Ronnell Freshwater, MD;  Location: [facility];  Service: Ophthalmology;  Laterality: Left;  LEFT  . COLON SURGERY      Prior to Admission medications   Medication Sig Start Date End Date Taking? Authorizing Provider  albuterol (PROAIR HFA) 108 (90 BASE) MCG/ACT inhaler Inhale into the lungs. 03/23/15   [provider]  amLODipine (NORVASC) 10 MG tablet Take by mouth. 09/11/14   [provider]  aspirin EC 81 MG tablet Take 81 mg by mouth daily.    [provider]  diazepam (VALIUM) 5 MG tablet Take 1 tablet (5 mg total) by mouth at bedtime. 02/23/17   Rainey Pines, MD  dicyclomine (BENTYL) 10 MG capsule TAKE 1 CAPSULE (10 MG TOTAL) BY MOUTH 4 (FOUR) TIMES DAILY BEFORE MEALS AND NIGHTLY. 10/17/14   [provider]  fentaNYL (DURAGESIC - DOSED MCG/HR) 75 MCG/HR Place 25 mcg onto the skin every 3 (three) days.    [provider]  fluticasone (FLONASE) 50 MCG/ACT nasal spray Place into the nose. 03/23/15 08/04/16  [provider]  Fluticasone-Salmeterol (ADVAIR DISKUS) 250-50 MCG/DOSE AEPB Inhale into the lungs. 03/23/15   [provider]  liothyronine (CYTOMEL) 50 MCG tablet Take by mouth. 01/30/15 08/04/16  [provider]  loratadine  (CLARITIN) 10 MG tablet Take by mouth. 01/30/15   [provider]  losartan-hydrochlorothiazide (HYZAAR) 100-25 MG per tablet TAKE 1 TABLET BY MOUTH DAILY. 12/11/14   [provider]  montelukast (SINGULAIR) 10 MG tablet TAKE 1 TABLET BY MOUTH NIGHTLY 04/12/15   [provider]  nystatin cream (MYCOSTATIN) Apply topically. 01/30/15   [provider]  oxyCODONE (ROXICODONE) 15 MG immediate release tablet  06/08/15   [provider]  QUEtiapine (SEROQUEL) 50 MG tablet Take 1 tablet (50 mg total) by mouth at bedtime. 02/23/17   Rainey Pines, MD  ranitidine (ZANTAC) 150 MG capsule Take by mouth. 03/26/15   [provider]  sertraline (ZOLOFT) 100 MG tablet Take 1.5 tablets (150 mg total) by mouth daily. 02/23/17   Rainey Pines, MD  theophylline (UNIPHYL) 400 MG 24 hr tablet Take by mouth. 01/30/15 08/04/16  [provider]  tiotropium (SPIRIVA HANDIHALER) 18 MCG inhalation capsule Place into inhaler and inhale. 03/23/15   [provider]    Allergies Minocycline and Levofloxacin  Family History  Problem Relation Age of Onset  . Lung cancer Mother   . Hypertension Mother   . Diabetes Mother   . Anxiety disorder Mother   . Depression Mother   . Hypertension Father   . Diabetes Father   . Heart attack Father   . Depression Father   . Anxiety disorder Father   . Alcohol abuse Father   . Drug abuse Father   . Cancer Sister   . Diabetes Sister   . Hypertension Sister   . Obesity Sister   . Hypertension Brother   . Hypertension Brother   . Liver disease Brother   . Colon cancer Brother     Social History Social History  Substance Use Topics  . Smoking status: Current Every Day Smoker    Packs/day: 0.50    Years: 50.00    Types: Cigarettes    Start date: 05/21/1970  . Smokeless tobacco: Never Used     Comment: However patient relates that she smoked for 50 years and at one point she was up to 2 packs per day  . Alcohol use  No    Review of Systems  Constitutional: No fever/chills Eyes: No visual changes. ENT: No sore throat. Cardiovascular: Denies chest pain. Respiratory: Denies shortness of breath. Gastrointestinal:  no vomiting.  No diarrhea.  No constipation. Genitourinary: Negative for dysuria. Musculoskeletal: Negative for back pain. Skin: Negative for rash. Neurological: Negative for headaches, focal weakness or numbness.   ____________________________________________   PHYSICAL EXAM:  VITAL SIGNS: ED Triage Vitals  Enc Vitals Group     BP 03/24/17 1722 (!) 152/85     Pulse Rate 03/24/17 1722 (!) 116     Resp 03/24/17 1722 (!) 115     Temp 03/24/17 1722 98.9 F (37.2 C)     Temp Source 03/24/17 1722 Oral     SpO2 03/24/17 1722 91 %     Weight 03/24/17 1722 150 lb (68 kg)     Height 03/24/17 1722 5\' 4"  (  1.626 m)     Head Circumference --      Peak Flow --      Pain Score 03/24/17 1721 9     Pain Loc --      Pain Edu? --      Excl. in [place]? --     Constitutional: Alert and oriented. Well appearing and in no acute distress. Eyes: Conjunctivae are normal.  Head: Atraumatic. Nose: No congestion/rhinnorhea.Wearing nasal cannula oxygen. Mouth/Throat: Mucous membranes are moist.  Neck: No stridor.   Cardiovascular: Heart rate of 100 bpm, regular rhythm. Grossly normal heart sounds.   Respiratory: Normal respiratory effort.  No retractions. Lungs CTAB.  Gastrointestinal: Soft With moderate tenderness to palpation just right of the midline where the patient has a site with a pinpoint opening with surrounding exposed mucosal tissue in a wheal of about 1 cm. There is tenderness to palpation at the site of the lesion as well as to the upper abdomen without distention. No lower abdominal tenderness to palpation. No distention.  Musculoskeletal: No lower extremity tenderness nor edema.  No joint effusions. Neurologic:  Normal speech and language. No gross focal neurologic deficits are  appreciated. Skin:  Skin is warm, dry and intact. No rash noted. Psychiatric: Mood and affect are normal. Speech and behavior are normal.  ____________________________________________   LABS (all labs ordered are listed, but only abnormal results are displayed)  Labs Reviewed  CBC WITH DIFFERENTIAL/PLATELET  COMPREHENSIVE METABOLIC PANEL  LIPASE, BLOOD  TROPONIN I   ____________________________________________  EKG  ED ECG REPORT I, Emogene Muratalla,  Youlanda Roys, the attending physician, personally viewed and interpreted this ECG.   Date: 03/24/2017  EKG Time: 1829  Rate: 91  Rhythm: normal sinus rhythm  Axis: Normal  Intervals:none  ST&T Change: Very mild ST elevation in II, III, and F aVF. No reciprocal depression. No T-wave abnormality except for single T-wave inversion in aVL. Similar morphology to the EKG of 08/13/2015 ____________________________________________  RADIOLOGY IMPRESSION: 1. Postsurgical changes of ventral hernia repair with thickening of the skin and adjacent subcutaneous fat. No drainable fluid collection/abscess identified. There has been interval resolution of the previously seen fluid collection deep to the hernia repair site with residual scarring. Small focus of skin protrusion may represent scarring although a tiny fluid is not entirely excluded. Ultrasound may provide better evaluation. There is a small fat containing right supraumbilical hernia. 2. Postsurgical changes of partial colon resection with ileotransverse anastomosis. No bowel obstruction or active inflammation. 3. Diverticulosis of the sigmoid colon as well as to tibial. No active inflammatory changes. 4. Renovascular calcification versus less likely a nonobstructing right renal upper pole stone. No hydronephrosis. 5. Aortic Atherosclerosis (ICD10-I70.0).  ____________________________________________   PROCEDURES  Procedure(s) performed:   Procedures  Critical Care performed:     ____________________________________________   INITIAL IMPRESSION / ASSESSMENT AND PLAN / ED COURSE  Pertinent labs & imaging results that were available during my care of the patient were reviewed by me and considered in my medical decision making (see chart for details).  ----------------------------------------- 9:09 PM on 03/24/2017 -----------------------------------------  Scans reviewed by Dr. Rosana Hoes who I called to ask further about the patient's findings. He agrees with the plan for by mouth antibiotics and follow-up as an outpatient. The patient has an appointment scheduled already with her primary care doctor this Friday. I also discussed with her following up with her surgeon at [facility]. She is understanding of the plan and willing to comply. No fistula tract or abscess seen. Reassuring  labs as well as vital signs at this time. Patient will be discharged home. Reviewed the imaging results and she is understanding of the plan and willing to comply.      ____________________________________________   FINAL CLINICAL IMPRESSION(S) / ED DIAGNOSES  Cellulitis.    NEW MEDICATIONS STARTED DURING THIS VISIT:  New Prescriptions   No medications on file     Note:  This document was prepared using Dragon voice recognition software and may include unintentional dictation errors.     Orbie Pyo, MD 03/24/17 2110

## 2017-03-30 ENCOUNTER — Other Ambulatory Visit: Payer: Self-pay | Admitting: Psychiatry

## 2017-04-18 ENCOUNTER — Other Ambulatory Visit: Payer: Self-pay | Admitting: Psychiatry

## 2017-05-04 ENCOUNTER — Other Ambulatory Visit: Payer: Self-pay | Admitting: Family Medicine

## 2017-05-04 DIAGNOSIS — Z1231 Encounter for screening mammogram for malignant neoplasm of breast: Secondary | ICD-10-CM

## 2017-05-18 ENCOUNTER — Ambulatory Visit (INDEPENDENT_AMBULATORY_CARE_PROVIDER_SITE_OTHER): Payer: 59 | Admitting: Psychiatry

## 2017-05-18 ENCOUNTER — Encounter: Payer: Self-pay | Admitting: Psychiatry

## 2017-05-18 VITALS — BP 131/71 | HR 92 | Temp 97.7°F | Wt 150.8 lb

## 2017-05-18 DIAGNOSIS — F1394 Sedative, hypnotic or anxiolytic use, unspecified with sedative, hypnotic or anxiolytic-induced mood disorder: Secondary | ICD-10-CM | POA: Diagnosis not present

## 2017-05-18 DIAGNOSIS — F331 Major depressive disorder, recurrent, moderate: Secondary | ICD-10-CM

## 2017-05-18 MED ORDER — SERTRALINE HCL 100 MG PO TABS: 150.0000 mg | ORAL_TABLET | Freq: Every day | ORAL | 0 refills | Status: DC

## 2017-05-18 MED ORDER — DIAZEPAM 5 MG PO TABS: ORAL_TABLET | ORAL | 2 refills | Status: DC

## 2017-05-18 MED ORDER — QUETIAPINE FUMARATE 100 MG PO TABS: 100.0000 mg | ORAL_TABLET | Freq: Every day | ORAL | 1 refills | Status: DC

## 2017-05-18 NOTE — Progress Notes (Signed)
[facility] MD/PA/NP OP Progress Note  05/18/2017 1:06 PM Shirley Alvarado  MRN:  [id number]  Subjective:  Patient is a 66 year old female who presented for follow-up appointment.  She reported that she has been trying to quit smoking and brought the Quit now pack with her. She stated that she wants to go higher on the dose of the Valium as she is becoming more anxious. She stated that she has also increased the dose of the Seroquel to 100 mg from her previous prescription. She appeared calm during the interview. She currently uses oxygen tank as usual. She denied having any sleep problems.  Patient reported that she feels more apprehensive during the daytime. We discussed about taking Valium 2.5 mg during the daytime and she agreed with the plan. She reported that she is motivated to stop smoking. Patient denied having any suicidal homicidal ideations or plans. He  .       Chief Complaint:  Chief Complaint    Follow-up; Medication Refill     Visit Diagnosis:     ICD-10-CM   1. Major depressive disorder, recurrent episode, moderate (HCC) F33.1   2. 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 Anxiety   . Basal cell carcinoma   . Cancer ([place])   . Chronic pain   . COPD (chronic obstructive pulmonary disease) ([place])   . Depression   . Hypertension   . Ventral hernia     Past Surgical History:  Procedure Laterality Date  . CATARACT EXTRACTION W/PHACO Left 08/04/2016   Procedure: CATARACT EXTRACTION PHACO AND INTRAOCULAR LENS PLACEMENT (IOC);  Surgeon: Ronnell Freshwater, MD;  Location: [facility];  Service: Ophthalmology;  Laterality: Left;  LEFT  . COLON SURGERY     Family History:  Family History  Problem Relation Age of Onset  . Lung cancer Mother   . Hypertension Mother   . Diabetes Mother   . Anxiety disorder Mother   . Depression Mother   . Hypertension Father   .  Diabetes Father   . Heart attack Father   . Depression Father   . Anxiety disorder Father   . Alcohol abuse Father   . Drug abuse Father   . Cancer Sister   . Diabetes Sister   . Hypertension Sister   . Obesity Sister   . Hypertension Brother   . Hypertension Brother   . Liver disease Brother   . Colon cancer Brother    Social History:  Social History   Social History  . Marital status: Widowed    Spouse name: N/A  . Number of children: N/A  . Years of education: N/A   Social History Main Topics  . Smoking status: Current Every Day Smoker    Packs/day: 0.50    Years: 50.00    Types: Cigarettes    Start date: 05/21/1970  . Smokeless tobacco: Never Used     Comment: However patient relates that she smoked for 50 years and at one point she was up to 2 packs per day  . Alcohol use No  . Drug use: No  . Sexual activity: Not Currently   Other Topics Concern  . None   Social History Narrative  . None   Additional History:   Assessment:   Musculoskeletal: Strength & Muscle Tone: within normal limits Gait & Station: Slow  Patient leans: N/A  Psychiatric Specialty Exam: Medication Refill  Anxiety  Symptoms include nervous/anxious behavior. Patient reports no insomnia or suicidal ideas.      Review of Systems  Psychiatric/Behavioral: Positive for depression and substance abuse. Negative for hallucinations, memory loss and suicidal ideas. The patient is nervous/anxious. The patient does not have insomnia.   All other systems reviewed and are negative.   Blood pressure 131/71, pulse 92, temperature 97.7 F (36.5 C), temperature source Oral, weight 150 lb 12.8 oz (68.4 kg), SpO2 92 %.Body mass index is 25.88 kg/m.  General Appearance: Fairly Groomed  Eye Contact:  Good  Speech:  Normal Rate  Volume:  Normal  Mood:  Euthymic  Affect:  Appropriate  Thought Process:  Linear  Orientation:  Full (Time, Place, and Person)  Thought Content:  WDL  Suicidal  Thoughts:  No  Homicidal Thoughts:  No  Memory:  Immediate;   Good Recent;   Good Remote;   Good  Judgement:  Good  Insight:  Good  Psychomotor Activity:  Normal  Concentration:  Good  Recall:  Good  Fund of Knowledge: Good  Language: Good  Akathisia:  Negative  Handed:  Right unknown   AIMS (if indicated):  N/A  Assets:  Communication Skills Desire for Improvement  ADL's:  Intact  Cognition: WNL  Sleep:  good   Is the patient at risk to self?  No. Has the patient been a risk to self in the past 6 months?  No. Has the patient been a risk to self within the distant past?  No. Is the patient a risk to others?  No. Has the patient been a risk to others in the past 6 months?  No. Has the patient been a risk to others within the distant past?  No.  Current Medications: Current Outpatient Prescriptions  Medication Sig Dispense Refill  . albuterol (PROAIR HFA) 108 (90 BASE) MCG/ACT inhaler Inhale into the lungs.    Marland Kitchen amLODipine (NORVASC) 10 MG tablet Take 10 mg by mouth daily.     Marland Kitchen aspirin EC 81 MG tablet Take 81 mg by mouth daily.    . diazepam (VALIUM) 5 MG tablet 1/2 pill BID- to be filled 06/01/17 30 tablet 2  . fentaNYL (DURAGESIC - DOSED MCG/HR) 25 MCG/HR patch Place 25 mcg onto the skin every 3 (three) days.    . Fluticasone-Salmeterol (ADVAIR DISKUS) 250-50 MCG/DOSE AEPB Inhale 1 puff into the lungs 2 (two) times daily.     Marland Kitchen loratadine (CLARITIN) 10 MG tablet Take 10 mg by mouth daily.     Marland Kitchen losartan-hydrochlorothiazide (HYZAAR) 100-25 MG per tablet TAKE 1 TABLET BY MOUTH DAILY.    . meloxicam (MOBIC) 15 MG tablet Take 15 mg by mouth daily.    . mometasone (NASONEX) 50 MCG/ACT nasal spray Place 1 spray into the nose daily.    . montelukast (SINGULAIR) 10 MG tablet TAKE 1 TABLET BY MOUTH NIGHTLY    . oxyCODONE (ROXICODONE) 15 MG immediate release tablet Take 15 mg by mouth.    . QUEtiapine (SEROQUEL) 100 MG tablet Take 1 tablet (100 mg total) by mouth at bedtime. 90 tablet  1  . ranitidine (ZANTAC) 150 MG capsule Take 150 mg by mouth at bedtime.     . sertraline (ZOLOFT) 100 MG tablet Take 1.5 tablets (150 mg total) by mouth daily. 135 tablet 0  . sulfamethoxazole-trimethoprim (BACTRIM DS,SEPTRA DS) 800-160 MG tablet Take 2 tablets by mouth 2 (two) times daily. 40 tablet 0  . theophylline (UNIPHYL) 400 MG 24 hr tablet Take  400 mg by mouth daily.    Marland Kitchen tiotropium (SPIRIVA HANDIHALER) 18 MCG inhalation capsule Place 18 mcg into inhaler and inhale daily.     Marland Kitchen liothyronine (CYTOMEL) 50 MCG tablet Take 50 mcg by mouth daily.      No current facility-administered medications for this visit.     Medical Decision Making:  Established Problem, Stable/Improving (1) and Review of New Medication or Change in Dosage (2)  Treatment Plan Summary:Medication management and Plan      We will continue on Zoloft 150 mg in the morning for her depression and anxiety Continue Seroquel 100  mg at bedtime. Continue  Valium 2.5  mg by mouth  BID . She  Agreed with the plan. She was given  3 month supply of the medications.     More than 50% of the time spent in psychoeducation, counseling and coordination of care.    This note was generated in part or whole with voice recognition software. Voice regonition is usually quite accurate but there are transcription errors that can and very often do occur. I apologize for any typographical errors that were not detected and corrected.    Rainey Pines, MD  05/18/2017, 1:06 PM

## 2017-05-20 ENCOUNTER — Ambulatory Visit
Admission: RE | Admit: 2017-05-20 | Discharge: 2017-05-20 | Disposition: A | Discharge status: Home / Self Care | Payer: Medicare Other | Source: Ambulatory Visit | Attending: Family Medicine | Admitting: Family Medicine

## 2017-05-20 DIAGNOSIS — Z1231 Encounter for screening mammogram for malignant neoplasm of breast: Secondary | ICD-10-CM | POA: Insufficient documentation

## 2017-07-20 ENCOUNTER — Ambulatory Visit: Payer: Medicare Other | Admitting: Psychiatry

## 2017-08-10 ENCOUNTER — Other Ambulatory Visit: Payer: Self-pay | Admitting: Psychiatry

## 2017-08-10 MED ORDER — SERTRALINE HCL 100 MG PO TABS: 150.0000 mg | ORAL_TABLET | Freq: Every day | ORAL | 0 refills | Status: DC

## 2017-08-24 ENCOUNTER — Ambulatory Visit (INDEPENDENT_AMBULATORY_CARE_PROVIDER_SITE_OTHER): Payer: Medicare Other | Admitting: Psychiatry

## 2017-08-24 ENCOUNTER — Other Ambulatory Visit: Payer: Self-pay

## 2017-08-24 ENCOUNTER — Encounter: Payer: Self-pay | Admitting: Psychiatry

## 2017-08-24 VITALS — BP 148/74 | HR 89 | Temp 98.0°F | Wt 151.6 lb

## 2017-08-24 DIAGNOSIS — F331 Major depressive disorder, recurrent, moderate: Secondary | ICD-10-CM

## 2017-08-24 DIAGNOSIS — F411 Generalized anxiety disorder: Secondary | ICD-10-CM | POA: Diagnosis not present

## 2017-08-24 MED ORDER — SERTRALINE HCL 100 MG PO TABS: 150.0000 mg | ORAL_TABLET | Freq: Every day | ORAL | 0 refills | Status: DC

## 2017-08-24 MED ORDER — DIAZEPAM 5 MG PO TABS: ORAL_TABLET | ORAL | 2 refills | Status: DC

## 2017-08-24 MED ORDER — QUETIAPINE FUMARATE 100 MG PO TABS: 100.0000 mg | ORAL_TABLET | Freq: Every day | ORAL | 1 refills | Status: DC

## 2017-08-24 MED ORDER — BUSPIRONE HCL 5 MG PO TABS: 5.0000 mg | ORAL_TABLET | Freq: Two times a day (BID) | ORAL | 2 refills | Status: DC

## 2017-08-24 NOTE — Progress Notes (Signed)
[facility] MD/PA/NP OP Progress Note  08/24/2017 10:57 AM Shirley Alvarado  MRN:  [id number]  Subjective:  Patient is a 66 year old female who presented for follow-up appointment.  She reported that she has been out of her Valium as she has been taking 1 pill on a daily basis. She reported that she has increased anxiety and does not know how to control her anxiety symptoms. She was tearful during the interview. She reported that she is planning to spend her holidays with her family but she will not be cooking. She reported that she lives with her grandsons and they are helping her. She reported that she wants to take medications to control her anxiety symptoms. She has been compliant with her medication and the Seroquel helps her sleep. She also takes Zoloft in the morning. We discussed about adjusting her medications and she agreed with the plan. She reported that she does not have any suicidal homicidal ideations or plans. She currently denied having any perceptual disturbances.      Chief Complaint:  Chief Complaint    Anxiety; Follow-up; Medication Refill     Visit Diagnosis:     ICD-10-CM   1. Major depressive disorder, recurrent episode, moderate (HCC) F33.1   2. GAD (generalized anxiety disorder) F41.1     Past Medical History:  Past Medical History:  Diagnosis Date  . Anxiety   . Basal cell carcinoma   . Cancer ([place])   . Chronic pain   . COPD (chronic obstructive pulmonary disease) ([place])   . Depression   . Hypertension   . Ventral hernia     Past Surgical History:  Procedure Laterality Date  . CATARACT EXTRACTION PHACO AND INTRAOCULAR LENS PLACEMENT ([place]) Left 08/04/2016   Performed by Ronnell Freshwater, MD at [facility]     Family History:  Family History  Problem Relation Age of Onset  . Lung cancer Mother   . Hypertension Mother   . Diabetes Mother   . Anxiety disorder Mother   . Depression Mother   . Breast cancer Mother   .  Hypertension Father   . Diabetes Father   . Heart attack Father   . Depression Father   . Anxiety disorder Father   . Alcohol abuse Father   . Drug abuse Father   . Cancer Sister   . Diabetes Sister   . Hypertension Sister   . Obesity Sister   . Hypertension Brother   . Hypertension Brother   . Liver disease Brother   . Colon cancer Brother   . Breast cancer Maternal Aunt   . Breast cancer Maternal Aunt   . Breast cancer Maternal Aunt    Social History:  Social History   Socioeconomic History  . Marital status: Widowed    Spouse name: None  . Number of children: 2  . Years of education: None  . Highest education level: 11th grade  Social Needs  . Financial resource strain: Very hard  . Food insecurity - worry: Sometimes true  . Food insecurity - inability: Sometimes true  . Transportation needs - medical: No  . Transportation needs - non-medical: No  Occupational History    Comment: retired  Tobacco Use  . Smoking status: Current Every Day Smoker    Packs/day: 0.50    Years: 50.00    Pack years: 25.00    Types: Cigarettes    Start date: 05/21/1970  . Smokeless tobacco: Never Used  . Tobacco comment:  However patient relates that she smoked for 50 years and at one point she was up to 2 packs per day  Substance and Sexual Activity  . Alcohol use: No    Alcohol/week: 0.0 oz  . Drug use: No  . Sexual activity: Not Currently  Other Topics Concern  . None  Social History Narrative  . None   Additional History:   Assessment:   Musculoskeletal: Strength & Muscle Tone: within normal limits Gait & Station: Slow  Patient leans: N/A  Psychiatric Specialty Exam: Medication Refill   Anxiety  Symptoms include nervous/anxious behavior. Patient reports no insomnia or suicidal ideas.      Review of Systems  Psychiatric/Behavioral: Positive for depression and substance abuse. Negative for hallucinations, memory loss and suicidal ideas. The patient is  nervous/anxious. The patient does not have insomnia.   All other systems reviewed and are negative.   Blood pressure (!) 148/74, pulse 89, temperature 98 F (36.7 C), temperature source Oral, weight 151 lb 9.6 oz (68.8 kg), SpO2 (!) 89 %.Body mass index is 26.02 kg/m.  General Appearance: Fairly Groomed  Eye Contact:  Good  Speech:  Normal Rate  Volume:  Normal  Mood:  Euthymic  Affect:  Appropriate  Thought Process:  Linear  Orientation:  Full (Time, Place, and Person)  Thought Content:  WDL  Suicidal Thoughts:  No  Homicidal Thoughts:  No  Memory:  Immediate;   Good Recent;   Good Remote;   Good  Judgement:  Good  Insight:  Good  Psychomotor Activity:  Normal  Concentration:  Good  Recall:  Good  Fund of Knowledge: Good  Language: Good  Akathisia:  Negative  Handed:  Right unknown   AIMS (if indicated):  N/A  Assets:  Communication Skills Desire for Improvement  ADL's:  Intact  Cognition: WNL  Sleep:  good   Is the patient at risk to self?  No. Has the patient been a risk to self in the past 6 months?  No. Has the patient been a risk to self within the distant past?  No. Is the patient a risk to others?  No. Has the patient been a risk to others in the past 6 months?  No. Has the patient been a risk to others within the distant past?  No.  Current Medications: Current Outpatient Medications  Medication Sig Dispense Refill  . albuterol (PROAIR HFA) 108 (90 BASE) MCG/ACT inhaler Inhale into the lungs.    Marland Kitchen amLODipine (NORVASC) 10 MG tablet Take 10 mg by mouth daily.     Marland Kitchen aspirin EC 81 MG tablet Take 81 mg by mouth daily.    . diazepam (VALIUM) 5 MG tablet 1/2 pill BID 30 tablet 2  . fentaNYL (DURAGESIC - DOSED MCG/HR) 25 MCG/HR patch Place 25 mcg onto the skin every 3 (three) days.    . Fluticasone-Salmeterol (ADVAIR DISKUS) 250-50 MCG/DOSE AEPB Inhale 1 puff into the lungs 2 (two) times daily.     Marland Kitchen loratadine (CLARITIN) 10 MG tablet Take 10 mg by mouth daily.      Marland Kitchen losartan-hydrochlorothiazide (HYZAAR) 100-25 MG per tablet TAKE 1 TABLET BY MOUTH DAILY.    . meloxicam (MOBIC) 15 MG tablet Take 15 mg by mouth daily.    . mometasone (NASONEX) 50 MCG/ACT nasal spray Place 1 spray into the nose daily.    . montelukast (SINGULAIR) 10 MG tablet TAKE 1 TABLET BY MOUTH NIGHTLY    . oxyCODONE (ROXICODONE) 15 MG immediate release tablet Take  15 mg by mouth.    . QUEtiapine (SEROQUEL) 100 MG tablet Take 1 tablet (100 mg total) at bedtime by mouth. 90 tablet 1  . ranitidine (ZANTAC) 150 MG capsule Take 150 mg by mouth at bedtime.     . sertraline (ZOLOFT) 100 MG tablet Take 1.5 tablets (150 mg total) daily by mouth. 135 tablet 0  . sulfamethoxazole-trimethoprim (BACTRIM DS,SEPTRA DS) 800-160 MG tablet Take 2 tablets by mouth 2 (two) times daily. 40 tablet 0  . theophylline (UNIPHYL) 400 MG 24 hr tablet Take 400 mg by mouth daily.    Marland Kitchen tiotropium (SPIRIVA HANDIHALER) 18 MCG inhalation capsule Place 18 mcg into inhaler and inhale daily.     . busPIRone (BUSPAR) 5 MG tablet Take 1 tablet (5 mg total) 2 (two) times daily by mouth. 60 tablet 2  . liothyronine (CYTOMEL) 50 MCG tablet Take 50 mcg by mouth daily.      No current facility-administered medications for this visit.     Medical Decision Making:  Established Problem, Stable/Improving (1) and Review of New Medication or Change in Dosage (2)  Treatment Plan Summary:Medication management and Plan      We will continue on Zoloft 150 mg in the morning for her depression and anxiety Continue Seroquel 100  mg at bedtime. Continue  Valium 2.5  mg by mouth  BID . I will start her on BuSpar 5 mg by mouth twice a day. Patient was given written instruction about her medications and she agreed with the plan.  She was given  3 month supply of the medications.     More than 50% of the time spent in psychoeducation, counseling and coordination of care.    This note was generated in part or whole with voice  recognition software. Voice regonition is usually quite accurate but there are transcription errors that can and very often do occur. I apologize for any typographical errors that were not detected and corrected.    Rainey Pines, MD  08/24/2017, 10:57 AM

## 2017-09-16 ENCOUNTER — Telehealth: Payer: Self-pay

## 2017-09-16 ENCOUNTER — Other Ambulatory Visit: Payer: Self-pay | Admitting: Psychiatry

## 2017-09-16 DIAGNOSIS — F419 Anxiety disorder, unspecified: Secondary | ICD-10-CM

## 2017-09-16 MED ORDER — BUSPIRONE HCL 5 MG PO TABS: 5.0000 mg | ORAL_TABLET | Freq: Three times a day (TID) | ORAL | 2 refills | Status: DC

## 2017-09-16 NOTE — Telephone Encounter (Signed)
pt called states that the buspar is helpping but she feel like it is not enough wanted to know if she can try taking 3 times a day instead of 2 times a day . or have the mg increased.   Pt was last seen by Dr. Gretel Acre on  08-24-17 next appt  Is 11-02-17    Disp Refills Start End   busPIRone (BUSPAR) 5 MG tablet 60 tablet 2 08/24/2017    Sig - Route: Take 1 tablet (5 mg total) 2 (two) times daily by mouth. - Oral   Sent to pharmacy as: busPIRone (BUSPAR) 5 MG tablet   E-Prescribing Status: Receipt confirmed by pharmacy (08/24/2017 10:16 AM EST)

## 2017-09-16 NOTE — Telephone Encounter (Signed)
Patient requesting an increase in her Buspar dosage , will increase to 5 mg tid.

## 2017-09-16 NOTE — Telephone Encounter (Signed)
Please let patient know she can start three times a day of buspar 5 mg , new script has been sent to [facility] .

## 2017-09-16 NOTE — Telephone Encounter (Signed)
Pt was called and notified

## 2017-11-02 ENCOUNTER — Telehealth: Payer: Self-pay

## 2017-11-02 ENCOUNTER — Ambulatory Visit: Payer: Medicare Other | Admitting: Psychiatry

## 2017-11-02 NOTE — Telephone Encounter (Signed)
pt states she can't come out because she has stage 3 copd and she cant breath she stays inside the house needs to know if you would do a phone call to her she is doing well on the medications you give her it just she get very out of breath with any activity.

## 2017-11-23 ENCOUNTER — Other Ambulatory Visit: Payer: Self-pay | Admitting: Psychiatry

## 2017-11-26 NOTE — Telephone Encounter (Signed)
As we discussed pls let me know once appointment is scheduled and I can fill in till she meets Dr.Faheem again.

## 2017-11-26 NOTE — Telephone Encounter (Signed)
pt called states she needs a refill on valium and buspar

## 2017-12-07 ENCOUNTER — Encounter: Payer: Self-pay | Admitting: Psychiatry

## 2017-12-07 ENCOUNTER — Ambulatory Visit (INDEPENDENT_AMBULATORY_CARE_PROVIDER_SITE_OTHER): Payer: Medicare Other | Admitting: Psychiatry

## 2017-12-07 VITALS — BP 140/70 | HR 80 | Ht 64.0 in | Wt 153.4 lb

## 2017-12-07 DIAGNOSIS — F1394 Sedative, hypnotic or anxiolytic use, unspecified with sedative, hypnotic or anxiolytic-induced mood disorder: Secondary | ICD-10-CM

## 2017-12-07 DIAGNOSIS — F411 Generalized anxiety disorder: Secondary | ICD-10-CM | POA: Diagnosis not present

## 2017-12-07 DIAGNOSIS — F419 Anxiety disorder, unspecified: Secondary | ICD-10-CM | POA: Diagnosis not present

## 2017-12-07 DIAGNOSIS — F331 Major depressive disorder, recurrent, moderate: Secondary | ICD-10-CM

## 2017-12-07 MED ORDER — SERTRALINE HCL 100 MG PO TABS: 100.0000 mg | ORAL_TABLET | Freq: Every day | ORAL | 1 refills | Status: DC

## 2017-12-07 MED ORDER — BUSPIRONE HCL 10 MG PO TABS: 10.0000 mg | ORAL_TABLET | Freq: Three times a day (TID) | ORAL | 3 refills | Status: DC

## 2017-12-07 MED ORDER — QUETIAPINE FUMARATE 100 MG PO TABS: 100.0000 mg | ORAL_TABLET | Freq: Every day | ORAL | 1 refills | Status: DC

## 2017-12-07 MED ORDER — DIAZEPAM 5 MG PO TABS
ORAL_TABLET | ORAL | 2 refills | Status: DC
Start: 2017-12-07 — End: 2018-02-26

## 2017-12-07 NOTE — Progress Notes (Signed)
BH MD/PA/NP OP Progress Note  12/07/2017 2:20 PM Shirley Alvarado  MRN:  [id number]  Subjective:  Patient is a 67 year old female who presented for follow-up appointment accompanied by her brother. She reported that she ran out of her Valium for the past month. I checked her [place] controlled registry and she failed her medication last time in January. She reported that she has been diagnosed with stage III COPD. Patient reported that she has been taking BuSpar 3 times daily and it has been helping her. Patient reported that she wants to have her medications adjusted. She has been having difficulty breathing. She also takes pain medication regularly prescribed by her pain management doctor. She denied having any suicidal ideations or plans. Discussed with her about the use of Valium and she reported that she has an appointment with her pulmonologist tomorrow. She currently denied having any suicidal homicidal ideations or plans. She denied having any perceptual disturbances at this time.        Chief Complaint:   Visit Diagnosis:     ICD-10-CM   1. Major depressive disorder, recurrent episode, moderate (HCC) F33.1   2. GAD (generalized anxiety disorder) F41.1   3. 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 Anxiety disorder, unspecified type F41.9 busPIRone (BUSPAR) 10 MG tablet    Past Medical History:  Past Medical History:  Diagnosis Date  . Anxiety   . Basal cell carcinoma   . Cancer ([place])   . Chronic pain   . COPD (chronic obstructive pulmonary disease) ([place])   . Depression   . Hypertension   . Ventral hernia     Past Surgical History:  Procedure Laterality Date  . CATARACT EXTRACTION W/PHACO Left 08/04/2016   Procedure: CATARACT EXTRACTION PHACO AND INTRAOCULAR LENS PLACEMENT (IOC);  Surgeon: Ronnell Freshwater, MD;  Location: [facility];  Service: Ophthalmology;  Laterality: Left;  LEFT  . COLON SURGERY     Family  History:  Family History  Problem Relation Age of Onset  . Lung cancer Mother   . Hypertension Mother   . Diabetes Mother   . Anxiety disorder Mother   . Depression Mother   . Breast cancer Mother   . Hypertension Father   . Diabetes Father   . Heart attack Father   . Depression Father   . Anxiety disorder Father   . Alcohol abuse Father   . Drug abuse Father   . Cancer Sister   . Diabetes Sister   . Hypertension Sister   . Obesity Sister   . Hypertension Brother   . Hypertension Brother   . Liver disease Brother   . Colon cancer Brother   . Breast cancer Maternal Aunt   . Breast cancer Maternal Aunt   . Breast cancer Maternal Aunt    Social History:  Social History   Socioeconomic History  . Marital status: Widowed    Spouse name: None  . Number of children: 2  . Years of education: None  . Highest education level: 11th grade  Social Needs  . Financial resource strain: Very hard  . Food insecurity - worry: Sometimes true  . Food insecurity - inability: Sometimes true  . Transportation needs - medical: No  . Transportation needs - non-medical: No  Occupational History    Comment: retired  Tobacco Use  . Smoking status: Current Every Day Smoker    Packs/day: 0.50    Years: 50.00    Pack years: 25.00  Types: Cigarettes    Start date: 05/21/1970  . Smokeless tobacco: Never Used  . Tobacco comment: However patient relates that she smoked for 50 years and at one point she was up to 2 packs per day  Substance and Sexual Activity  . Alcohol use: No    Alcohol/week: 0.0 oz  . Drug use: No  . Sexual activity: Not Currently  Other Topics Concern  . None  Social History Narrative  . None   Additional History:   Assessment:   Musculoskeletal: Strength & Muscle Tone: within normal limits Gait & Station: Slow  Patient leans: N/A  Psychiatric Specialty Exam: Anxiety  Symptoms include nervous/anxious behavior. Patient reports no insomnia or suicidal ideas.     Medication Refill     Review of Systems  Psychiatric/Behavioral: Positive for depression and substance abuse. Negative for hallucinations, memory loss and suicidal ideas. The patient is nervous/anxious. The patient does not have insomnia.   All other systems reviewed and are negative.   Blood pressure 140/70, pulse 80, height 5\' 4"  (1.626 m), weight 153 lb 6.4 oz (69.6 kg), SpO2 95 %.Body mass index is 26.33 kg/m.  General Appearance: Fairly Groomed  Eye Contact:  Good  Speech:  Normal Rate  Volume:  Normal  Mood:  Euthymic  Affect:  Appropriate  Thought Process:  Linear  Orientation:  Full (Time, Place, and Person)  Thought Content:  WDL  Suicidal Thoughts:  No  Homicidal Thoughts:  No  Memory:  Immediate;   Good Recent;   Good Remote;   Good  Judgement:  Good  Insight:  Good  Psychomotor Activity:  Normal  Concentration:  Good  Recall:  Good  Fund of Knowledge: Good  Language: Good  Akathisia:  Negative  Handed:  Right unknown   AIMS (if indicated):  N/A  Assets:  Communication Skills Desire for Improvement  ADL's:  Intact  Cognition: WNL  Sleep:  good   Is the patient at risk to self?  No. Has the patient been a risk to self in the past 6 months?  No. Has the patient been a risk to self within the distant past?  No. Is the patient a risk to others?  No. Has the patient been a risk to others in the past 6 months?  No. Has the patient been a risk to others within the distant past?  No.  Current Medications: Current Outpatient Medications  Medication Sig Dispense Refill  . albuterol (PROAIR HFA) 108 (90 BASE) MCG/ACT inhaler Inhale into the lungs.    Marland Kitchen amLODipine (NORVASC) 10 MG tablet Take 10 mg by mouth daily.     Marland Kitchen aspirin EC 81 MG tablet Take 81 mg by mouth daily.    . busPIRone (BUSPAR) 10 MG tablet Take 1 tablet (10 mg total) by mouth 3 (three) times daily. 90 tablet 3  . diazepam (VALIUM) 5 MG tablet 1/2 pill BID 30 tablet 2  . fentaNYL (DURAGESIC -  DOSED MCG/HR) 25 MCG/HR patch Place 25 mcg onto the skin every 3 (three) days.    . Fluticasone-Salmeterol (ADVAIR DISKUS) 250-50 MCG/DOSE AEPB Inhale 1 puff into the lungs 2 (two) times daily.     Marland Kitchen loratadine (CLARITIN) 10 MG tablet Take 10 mg by mouth daily.     Marland Kitchen losartan-hydrochlorothiazide (HYZAAR) 100-25 MG per tablet TAKE 1 TABLET BY MOUTH DAILY.    . meloxicam (MOBIC) 15 MG tablet Take 15 mg by mouth daily.    . mometasone (NASONEX) 50 MCG/ACT nasal  spray Place 1 spray into the nose daily.    . montelukast (SINGULAIR) 10 MG tablet TAKE 1 TABLET BY MOUTH NIGHTLY    . oxyCODONE (ROXICODONE) 15 MG immediate release tablet Take 15 mg by mouth.    . QUEtiapine (SEROQUEL) 100 MG tablet Take 1 tablet (100 mg total) by mouth at bedtime. 90 tablet 1  . ranitidine (ZANTAC) 150 MG capsule Take 150 mg by mouth at bedtime.     . sertraline (ZOLOFT) 100 MG tablet Take 1 tablet (100 mg total) by mouth daily. 90 tablet 1  . sulfamethoxazole-trimethoprim (BACTRIM DS,SEPTRA DS) 800-160 MG tablet Take 2 tablets by mouth 2 (two) times daily. 40 tablet 0  . theophylline (UNIPHYL) 400 MG 24 hr tablet Take 400 mg by mouth daily.    Marland Kitchen liothyronine (CYTOMEL) 50 MCG tablet Take 50 mcg by mouth daily.     Marland Kitchen tiotropium (SPIRIVA HANDIHALER) 18 MCG inhalation capsule Place 18 mcg into inhaler and inhale daily.      No current facility-administered medications for this visit.     Medical Decision Making:  Established Problem, Stable/Improving (1) and Review of New Medication or Change in Dosage (2)  Treatment Plan Summary:Medication management and Plan      We will continue on Zoloft 100 mg in the morning for her depression and anxiety Continue Seroquel 100  mg at bedtime. Continue  Valium 2.5  mg by mouth  BID . I will start her on BuSpar 10 mg by mouth tid  Patient was given written instruction about her medications and she agreed with the plan.  She was given  3 month supply of the medications.     More  than 50% of the time spent in psychoeducation, counseling and coordination of care.    This note was generated in part or whole with voice recognition software. Voice regonition is usually quite accurate but there are transcription errors that can and very often do occur. I apologize for any typographical errors that were not detected and corrected.    Rainey Pines, MD  12/07/2017, 2:20 PM

## 2017-12-10 ENCOUNTER — Telehealth: Payer: Self-pay

## 2017-12-10 NOTE — Telephone Encounter (Signed)
pt states that with the increase in the buspar she can not go to sleep.  pt needs something to help with sleep.

## 2017-12-14 NOTE — Telephone Encounter (Signed)
received fax requesting a 90 day supply of the buspirone hcl 5mg  be sent to the pharmacy.    busPIRone (BUSPAR) 10 MG tablet  Medication  Date: 12/07/2017 Department: [facility] Ordering/Authorizing: Rainey Pines, MD  Order Providers   Prescribing Provider Encounter Provider  Rainey Pines, MD Rainey Pines, MD  Medication Detail    Disp Refills Start End   busPIRone (BUSPAR) 10 MG tablet 90 tablet 3 12/07/2017    Sig - Route: Take 1 tablet (10 mg total) by mouth 3 (three) times daily. - Oral   Sent to pharmacy as: busPIRone (BUSPAR) 10 MG tablet   E-Prescribing Status: Receipt confirmed by pharmacy (12/07/2017 2:16 PM EST)   Associated Diagnoses   Anxiety disorder, unspecified type

## 2018-01-08 ENCOUNTER — Encounter (INDEPENDENT_AMBULATORY_CARE_PROVIDER_SITE_OTHER): Payer: Self-pay | Admitting: Vascular Surgery

## 2018-01-08 ENCOUNTER — Ambulatory Visit (INDEPENDENT_AMBULATORY_CARE_PROVIDER_SITE_OTHER): Payer: Medicare Other | Admitting: Vascular Surgery

## 2018-01-08 VITALS — BP 118/54 | HR 94 | Resp 16 | Ht 64.0 in | Wt 156.6 lb

## 2018-01-08 DIAGNOSIS — J449 Chronic obstructive pulmonary disease, unspecified: Secondary | ICD-10-CM

## 2018-01-08 DIAGNOSIS — G8929 Other chronic pain: Secondary | ICD-10-CM

## 2018-01-08 DIAGNOSIS — I6523 Occlusion and stenosis of bilateral carotid arteries: Secondary | ICD-10-CM

## 2018-01-08 DIAGNOSIS — I10 Essential (primary) hypertension: Secondary | ICD-10-CM | POA: Diagnosis not present

## 2018-01-08 DIAGNOSIS — I6529 Occlusion and stenosis of unspecified carotid artery: Secondary | ICD-10-CM | POA: Insufficient documentation

## 2018-01-08 NOTE — Assessment & Plan Note (Signed)
The patient remains asymptomatic with respect to the carotid stenosis.  However, the patient has now progressed and has a lesion the is >70%.  Patient should undergo CT angiography of the carotid arteries to define the degree of stenosis of the internal carotid arteries bilaterally and the anatomic suitability for surgery vs. intervention.  If the patient does indeed need surgery cardiac clearance will be required, once cleared the patient will be scheduled for surgery.  The risks, benefits and alternative therapies were reviewed in detail with the patient.  All questions were answered.  The patient agrees to proceed with imaging.  Continue antiplatelet therapy as prescribed. Continue management of CAD, HTN and Hyperlipidemia. Healthy heart diet, encouraged exercise at least 4 times per week.

## 2018-01-08 NOTE — Assessment & Plan Note (Signed)
On a fentanyl patch

## 2018-01-08 NOTE — Assessment & Plan Note (Signed)
blood pressure control important in reducing the progression of atherosclerotic disease. On appropriate oral medications.  

## 2018-01-08 NOTE — Assessment & Plan Note (Signed)
On home oxygen.  We would have to have her pulmonologist to determine whether or not she would be a surgical candidate.

## 2018-01-08 NOTE — Progress Notes (Signed)
Patient ID: Shirley Alvarado, female   DOB: 11/28/1950, 67 y.o.   MRN: [id number]  Chief Complaint  Patient presents with  . New Patient (Initial Visit)    ref Raul Del for Carotid Stenosis    HPI Shirley Alvarado is a 67 y.o. female.  I am asked to see the patient by Dr. Raul Del for evaluation of carotid stenosis.  The patient reports feeling poorly with lack of energy and some lightheadedness for weeks to months now.  No focal neurologic deficits. Specifically, the patient denies amaurosis fugax, speech or swallowing difficulties, or arm or leg weakness or numbness.  She has not had any stroke or mini stroke to her knowledge.  She does have severe COPD and is on oxygen.  Despite this she continues to smoke. A recent carotid ultrasound was performed which suggested 50-69% right ICA stenosis and greater than 70% left ICA stenosis.  She is referred for further evaluation and treatment.   Past Medical History:  Diagnosis Date  . Anxiety   . Basal cell carcinoma   . Cancer ([place])   . Chronic pain   . COPD (chronic obstructive pulmonary disease) ([place])   . Depression   . Hypertension   . Ventral hernia     Past Surgical History:  Procedure Laterality Date  . CATARACT EXTRACTION W/PHACO Left 08/04/2016   Procedure: CATARACT EXTRACTION PHACO AND INTRAOCULAR LENS PLACEMENT (IOC);  Surgeon: Ronnell Freshwater, MD;  Location: [facility];  Service: Ophthalmology;  Laterality: Left;  LEFT  . COLON SURGERY      Family History  Problem Relation Age of Onset  . Lung cancer Mother   . Hypertension Mother   . Diabetes Mother   . Anxiety disorder Mother   . Depression Mother   . Breast cancer Mother   . Hypertension Father   . Diabetes Father   . Heart attack Father   . Depression Father   . Anxiety disorder Father   . Alcohol abuse Father   . Drug abuse Father   . Cancer Sister   . Diabetes Sister   . Hypertension Sister   . Obesity Sister   . Hypertension Brother     . Hypertension Brother   . Liver disease Brother   . Colon cancer Brother   . Breast cancer Maternal Aunt   . Breast cancer Maternal Aunt   . Breast cancer Maternal Aunt      Social History Social History   Tobacco Use  . Smoking status: Current Every Day Smoker    Packs/day: 0.50    Years: 50.00    Pack years: 25.00    Types: Cigarettes    Start date: 05/21/1970  . Smokeless tobacco: Never Used  . Tobacco comment: However patient relates that she smoked for 50 years and at one point she was up to 2 packs per day  Substance Use Topics  . Alcohol use: No    Alcohol/week: 0.0 oz  . Drug use: No     Allergies  Allergen Reactions  . Minocycline Anaphylaxis    Throat swelling  . Levofloxacin Hives    Current Outpatient Medications  Medication Sig Dispense Refill  . albuterol (PROAIR HFA) 108 (90 BASE) MCG/ACT inhaler Inhale into the lungs.    Marland Kitchen amLODipine (NORVASC) 10 MG tablet Take 10 mg by mouth daily.     Marland Kitchen aspirin EC 81 MG tablet Take 81 mg by mouth daily.    . busPIRone (BUSPAR) 10 MG tablet  Take 1 tablet (10 mg total) by mouth 3 (three) times daily. 90 tablet 3  . diazepam (VALIUM) 5 MG tablet 1/2 pill BID 30 tablet 2  . fentaNYL (DURAGESIC - DOSED MCG/HR) 25 MCG/HR patch Place 25 mcg onto the skin every 3 (three) days.    Marland Kitchen loratadine (CLARITIN) 10 MG tablet Take 10 mg by mouth daily.     Marland Kitchen losartan-hydrochlorothiazide (HYZAAR) 100-25 MG per tablet TAKE 1 TABLET BY MOUTH DAILY.    . meloxicam (MOBIC) 15 MG tablet Take 15 mg by mouth daily.    . mometasone (NASONEX) 50 MCG/ACT nasal spray Place 1 spray into the nose daily.    . montelukast (SINGULAIR) 10 MG tablet TAKE 1 TABLET BY MOUTH NIGHTLY    . oxyCODONE (ROXICODONE) 15 MG immediate release tablet Take 15 mg by mouth.    . QUEtiapine (SEROQUEL) 100 MG tablet Take 1 tablet (100 mg total) by mouth at bedtime. 90 tablet 1  . ranitidine (ZANTAC) 150 MG capsule Take 150 mg by mouth at bedtime.     . sertraline  (ZOLOFT) 100 MG tablet Take 1 tablet (100 mg total) by mouth daily. 90 tablet 1  . theophylline (UNIPHYL) 400 MG 24 hr tablet Take 400 mg by mouth daily.    . Fluticasone-Salmeterol (ADVAIR DISKUS) 250-50 MCG/DOSE AEPB Inhale 1 puff into the lungs 2 (two) times daily.     Marland Kitchen liothyronine (CYTOMEL) 50 MCG tablet Take 50 mcg by mouth daily.     Marland Kitchen sulfamethoxazole-trimethoprim (BACTRIM DS,SEPTRA DS) 800-160 MG tablet Take 2 tablets by mouth 2 (two) times daily. (Patient not taking: Reported on 01/08/2018) 40 tablet 0  . tiotropium (SPIRIVA HANDIHALER) 18 MCG inhalation capsule Place 18 mcg into inhaler and inhale daily.      No current facility-administered medications for this visit.       REVIEW OF SYSTEMS (Negative unless checked)  Constitutional: [] Weight loss  [] Fever  [] Chills Cardiac: [] Chest pain   [] Chest pressure   [] Palpitations   [] Shortness of breath when laying flat   [] Shortness of breath at rest   [] Shortness of breath with exertion. Vascular:  [] Pain in legs with walking   [] Pain in legs at rest   [] Pain in legs when laying flat   [] Claudication   [] Pain in feet when walking  [] Pain in feet at rest  [] Pain in feet when laying flat   [] History of DVT   [] Phlebitis   [] Swelling in legs   [] Varicose veins   [] Non-healing ulcers Pulmonary:   [x] Uses home oxygen   [] Productive cough   [] Hemoptysis   [] Wheeze  [x] COPD   [] Asthma Neurologic:  [] Dizziness  [] Blackouts   [] Seizures   [] History of stroke   [] History of TIA  [] Aphasia   [] Temporary blindness   [] Dysphagia   [] Weakness or numbness in arms   [] Weakness or numbness in legs Musculoskeletal:  [x] Arthritis   [] Joint swelling   [x] Joint pain   [x] Low back pain Hematologic:  [] Easy bruising  [] Easy bleeding   [] Hypercoagulable state   [] Anemic  [] Hepatitis Gastrointestinal:  [] Blood in stool   [] Vomiting blood  [x] Gastroesophageal reflux/heartburn   [] Abdominal pain Genitourinary:  [] Chronic kidney disease   [] Difficult urination   [] Frequent urination  [] Burning with urination   [] Hematuria Skin:  [] Rashes   [] Ulcers   [] Wounds Psychological:  [] History of anxiety   []  History of major depression.    Physical Exam BP (!) 118/54 (BP Location: Right Arm)   Pulse 94   Resp  16   Ht 5\' 4"  (1.626 m)   Wt 71 kg (156 lb 9.6 oz)   BMI 26.88 kg/m  Gen:  WD/WN, NAD. Appears older than stated age. Head: [place]/AT, No temporalis wasting. Ear/Nose/Throat: Hearing grossly intact, nares w/o erythema or drainage, oropharynx w/o Erythema/Exudate Eyes: Conjunctiva clear, sclera non-icteric  Neck: trachea midline.  Bilateral carotid bruits Pulmonary:  Good air movement, but diminished bilaterally.  On supplemental oxygen  Cardiac: RRR, no JVD Vascular:  Vessel Right Left  Radial Palpable Palpable                          PT Not Palpable 1+ Palpable  DP 1+ Palpable 1+ Palpable   Gastrointestinal: soft, non-tender/non-distended.  Musculoskeletal: M/S 5/5 throughout.  Extremities without ischemic changes.  No deformity or atrophy. Trace LE edema. Neurologic: Sensation grossly intact in extremities.  Symmetrical.  Speech is fluent. Motor exam as listed above. Psychiatric: Judgment intact, Mood & affect appropriate for pt's clinical situation. Dermatologic: No rashes or ulcers noted.  No cellulitis or open wounds.  Radiology No results found.  Labs No results found for this or any previous visit (from the past 2160 hour(s)).  Assessment/Plan:  BP (high blood pressure) blood pressure control important in reducing the progression of atherosclerotic disease. On appropriate oral medications.   Chronic obstructive pulmonary disease ([place]) On home oxygen.  We would have to have her pulmonologist to determine whether or not she would be a surgical candidate.  Chronic pain On a fentanyl patch  Carotid stenosis The patient remains asymptomatic with respect to the carotid stenosis.  However, the patient has now progressed  and has a lesion the is >70%.  Patient should undergo CT angiography of the carotid arteries to define the degree of stenosis of the internal carotid arteries bilaterally and the anatomic suitability for surgery vs. intervention.  If the patient does indeed need surgery cardiac clearance will be required, once cleared the patient will be scheduled for surgery.  The risks, benefits and alternative therapies were reviewed in detail with the patient.  All questions were answered.  The patient agrees to proceed with imaging.  Continue antiplatelet therapy as prescribed. Continue management of CAD, HTN and Hyperlipidemia. Healthy heart diet, encouraged exercise at least 4 times per week.        Leotis Pain 01/08/2018, 12:05 PM   This note was created with Dragon medical transcription system.  Any errors from dictation are unintentional.

## 2018-01-08 NOTE — Patient Instructions (Signed)
Carotid Artery Disease The carotid arteries are arteries on both sides of the neck. They carry blood to the brain. Carotid artery disease is when the arteries get smaller (narrow) or get blocked. If these arteries get smaller or get blocked, you are more likely to have a stroke or warning stroke (transient ischemic attack). Follow these instructions at home:  Take medicines as told by your doctor. Make sure you understand all your medicine instructions. Do not stop your medicines without talking to your doctor first.  Follow your doctor's diet instructions. It is important to eat a healthy diet that includes plenty of: ? Fresh fruits. ? Vegetables. ? Lean meats.  Avoid: ? High-fat foods. ? High-sodium foods. ? Foods that are fried, overly processed, or have poor nutritional value.  Stay a healthy weight.  Stay active. Get at least 30 minutes of activity every day.  Do not smoke.  Limit alcohol use to: ? No more than 2 drinks a day for men. ? No more than 1 drink a day for women who are not pregnant.  Do not use illegal drugs.  Keep all doctor visits as told. Get help right away if:  You have sudden weakness or loss of feeling (numbness) on one side of the body, such as the face, arm, or leg.  You have sudden confusion.  You have trouble speaking (aphasia) or understanding.  You have sudden trouble seeing out of one or both eyes.  You have sudden trouble walking.  You have dizziness or feel like you might pass out (faint).  You have a loss of balance or your movements are not steady (uncoordinated).  You have a sudden, severe headache with no known cause.  You have trouble swallowing (dysphagia). Call your local emergency services (911 in [place] Do notdrive yourself to the clinic or hospital. This information is not intended to replace advice given to you by your health care provider. Make sure you discuss any questions you have with your health care  provider. Document Released: 09/08/2012 Document Revised: 02/28/2016 Document Reviewed: 03/23/2013 Elsevier Interactive Patient Education  2018 [employer]  

## 2018-01-11 ENCOUNTER — Telehealth: Payer: Self-pay

## 2018-01-11 NOTE — Telephone Encounter (Signed)
received a fax requesting a 90 day supply of medication instead of thirty day supply on the buspirone hcl 5mg .

## 2018-01-13 ENCOUNTER — Telehealth (INDEPENDENT_AMBULATORY_CARE_PROVIDER_SITE_OTHER): Payer: Self-pay

## 2018-01-13 NOTE — Telephone Encounter (Signed)
Patient called and left a message stating her head felt like it was bursting. I called her back and explained that she may need to see her PCP regarding head pain and if she has any questions to call our office.

## 2018-01-14 ENCOUNTER — Ambulatory Visit
Admission: RE | Admit: 2018-01-14 | Discharge: 2018-01-14 | Disposition: A | Discharge status: Home / Self Care | Payer: Medicare Other | Source: Ambulatory Visit | Attending: Vascular Surgery | Admitting: Vascular Surgery

## 2018-01-14 DIAGNOSIS — I700 Atherosclerosis of aorta: Secondary | ICD-10-CM | POA: Diagnosis not present

## 2018-01-14 DIAGNOSIS — I6523 Occlusion and stenosis of bilateral carotid arteries: Secondary | ICD-10-CM

## 2018-01-14 DIAGNOSIS — I671 Cerebral aneurysm, nonruptured: Secondary | ICD-10-CM | POA: Diagnosis not present

## 2018-01-14 DIAGNOSIS — J432 Centrilobular emphysema: Secondary | ICD-10-CM | POA: Diagnosis not present

## 2018-01-14 DIAGNOSIS — R911 Solitary pulmonary nodule: Secondary | ICD-10-CM | POA: Diagnosis not present

## 2018-01-14 DIAGNOSIS — I6501 Occlusion and stenosis of right vertebral artery: Secondary | ICD-10-CM | POA: Diagnosis not present

## 2018-01-14 LAB — POCT I-STAT CREATININE: Creatinine, Ser: 0.5 mg/dL (ref 0.44–1.00)

## 2018-01-14 MED ORDER — IOHEXOL 350 MG/ML SOLN
75.0000 mL | Freq: Once | INTRAVENOUS | Status: AC | PRN
  Administered 2018-01-14: 75 mL via INTRAVENOUS

## 2018-01-14 NOTE — Telephone Encounter (Signed)
received another fax requesting a 90 day supply of the buspirone hcl 5mg 

## 2018-01-15 ENCOUNTER — Other Ambulatory Visit: Payer: Self-pay | Admitting: Psychiatry

## 2018-01-18 NOTE — Telephone Encounter (Signed)
Pt has supply of meds. She was seen 3/4. Given 3 months supply

## 2018-01-19 ENCOUNTER — Ambulatory Visit (INDEPENDENT_AMBULATORY_CARE_PROVIDER_SITE_OTHER): Payer: Medicare Other | Admitting: Vascular Surgery

## 2018-01-19 ENCOUNTER — Other Ambulatory Visit (INDEPENDENT_AMBULATORY_CARE_PROVIDER_SITE_OTHER): Payer: Self-pay | Admitting: Vascular Surgery

## 2018-01-19 ENCOUNTER — Encounter (INDEPENDENT_AMBULATORY_CARE_PROVIDER_SITE_OTHER): Payer: Self-pay | Admitting: Vascular Surgery

## 2018-01-19 ENCOUNTER — Encounter (INDEPENDENT_AMBULATORY_CARE_PROVIDER_SITE_OTHER): Payer: Self-pay

## 2018-01-19 VITALS — BP 123/68 | HR 83 | Resp 16 | Ht 64.0 in | Wt 156.0 lb

## 2018-01-19 DIAGNOSIS — I6523 Occlusion and stenosis of bilateral carotid arteries: Secondary | ICD-10-CM | POA: Diagnosis not present

## 2018-01-19 DIAGNOSIS — I10 Essential (primary) hypertension: Secondary | ICD-10-CM

## 2018-01-19 DIAGNOSIS — J449 Chronic obstructive pulmonary disease, unspecified: Secondary | ICD-10-CM

## 2018-01-19 NOTE — Progress Notes (Signed)
MRN : [id number]  Shirley Alvarado is a 67 y.o. ([birth year]) female who presents with chief complaint of  Chief Complaint  Patient presents with  . Follow-up    ct results  .  History of Present Illness: Patient returns today in follow up of her carotid disease.  She continues to have symptoms of pain and cerebral ischemia with very poor energy, lethargy, and forgetfulness.  She has a litany of other medical issues as listed below which are about the same.  Since her last visit, I have independently reviewed her CT angiogram.  This demonstrates a critical right carotid artery stenosis of 90% or greater by my interpretation.  She has a left internal carotid artery occlusion.  She also has intracranial disease.       Past Medical History:  Diagnosis Date  . Anxiety   . Basal cell carcinoma   . Cancer ([place])   . Chronic pain   . COPD (chronic obstructive pulmonary disease) ([place])   . Depression   . Hypertension   . Ventral hernia          Past Surgical History:  Procedure Laterality Date  . CATARACT EXTRACTION W/PHACO Left 08/04/2016   Procedure: CATARACT EXTRACTION PHACO AND INTRAOCULAR LENS PLACEMENT (IOC);  Surgeon: Ronnell Freshwater, MD;  Location: [facility];  Service: Ophthalmology;  Laterality: Left;  LEFT  . COLON SURGERY           Family History  Problem Relation Age of Onset  . Lung cancer Mother   . Hypertension Mother   . Diabetes Mother   . Anxiety disorder Mother   . Depression Mother   . Breast cancer Mother   . Hypertension Father   . Diabetes Father   . Heart attack Father   . Depression Father   . Anxiety disorder Father   . Alcohol abuse Father   . Drug abuse Father   . Cancer Sister   . Diabetes Sister   . Hypertension Sister   . Obesity Sister   . Hypertension Brother   . Hypertension Brother   . Liver disease Brother   . Colon cancer Brother   . Breast cancer Maternal Aunt   . Breast  cancer Maternal Aunt   . Breast cancer Maternal Aunt      Social History Social History        Tobacco Use  . Smoking status: Current Every Day Smoker    Packs/day: 0.50    Years: 50.00    Pack years: 25.00    Types: Cigarettes    Start date: 05/21/1970  . Smokeless tobacco: Never Used  . Tobacco comment: However patient relates that she smoked for 50 years and at one point she was up to 2 packs per day  Substance Use Topics  . Alcohol use: No    Alcohol/week: 0.0 oz  . Drug use: No          Allergies  Allergen Reactions  . Minocycline Anaphylaxis    Throat swelling  . Levofloxacin Hives          Current Outpatient Medications  Medication Sig Dispense Refill  . albuterol (PROAIR HFA) 108 (90 BASE) MCG/ACT inhaler Inhale into the lungs.    Marland Kitchen amLODipine (NORVASC) 10 MG tablet Take 10 mg by mouth daily.     Marland Kitchen aspirin EC 81 MG tablet Take 81 mg by mouth daily.    . busPIRone (BUSPAR) 10 MG tablet Take 1 tablet (10 mg  total) by mouth 3 (three) times daily. 90 tablet 3  . diazepam (VALIUM) 5 MG tablet 1/2 pill BID 30 tablet 2  . fentaNYL (DURAGESIC - DOSED MCG/HR) 25 MCG/HR patch Place 25 mcg onto the skin every 3 (three) days.    Marland Kitchen loratadine (CLARITIN) 10 MG tablet Take 10 mg by mouth daily.     Marland Kitchen losartan-hydrochlorothiazide (HYZAAR) 100-25 MG per tablet TAKE 1 TABLET BY MOUTH DAILY.    . meloxicam (MOBIC) 15 MG tablet Take 15 mg by mouth daily.    . mometasone (NASONEX) 50 MCG/ACT nasal spray Place 1 spray into the nose daily.    . montelukast (SINGULAIR) 10 MG tablet TAKE 1 TABLET BY MOUTH NIGHTLY    . oxyCODONE (ROXICODONE) 15 MG immediate release tablet Take 15 mg by mouth.    . QUEtiapine (SEROQUEL) 100 MG tablet Take 1 tablet (100 mg total) by mouth at bedtime. 90 tablet 1  . ranitidine (ZANTAC) 150 MG capsule Take 150 mg by mouth at bedtime.     . sertraline (ZOLOFT) 100 MG tablet Take 1 tablet (100 mg total) by mouth  daily. 90 tablet 1  . theophylline (UNIPHYL) 400 MG 24 hr tablet Take 400 mg by mouth daily.    . Fluticasone-Salmeterol (ADVAIR DISKUS) 250-50 MCG/DOSE AEPB Inhale 1 puff into the lungs 2 (two) times daily.     Marland Kitchen liothyronine (CYTOMEL) 50 MCG tablet Take 50 mcg by mouth daily.     Marland Kitchen sulfamethoxazole-trimethoprim (BACTRIM DS,SEPTRA DS) 800-160 MG tablet Take 2 tablets by mouth 2 (two) times daily. (Patient not taking: Reported on 01/08/2018) 40 tablet 0  . tiotropium (SPIRIVA HANDIHALER) 18 MCG inhalation capsule Place 18 mcg into inhaler and inhale daily.      No current facility-administered medications for this visit.       REVIEW OF SYSTEMS (Negative unless checked)  Constitutional: [] Weight loss  [] Fever  [] Chills Cardiac: [] Chest pain   [] Chest pressure   [] Palpitations   [] Shortness of breath when laying flat   [x] Shortness of breath at rest   [x] Shortness of breath with exertion. Vascular:  [] Pain in legs with walking   [] Pain in legs at rest   [] Pain in legs when laying flat   [] Claudication   [] Pain in feet when walking  [] Pain in feet at rest  [] Pain in feet when laying flat   [] History of DVT   [] Phlebitis   [] Swelling in legs   [] Varicose veins   [] Non-healing ulcers Pulmonary:   [x] Uses home oxygen   [] Productive cough   [] Hemoptysis   [] Wheeze  [x] COPD   [] Asthma Neurologic:  [] Dizziness  [] Blackouts   [] Seizures   [] History of stroke   [] History of TIA  [] Aphasia   [] Temporary blindness   [] Dysphagia   [] Weakness or numbness in arms   [] Weakness or numbness in legs Musculoskeletal:  [x] Arthritis   [] Joint swelling   [x] Joint pain   [x] Low back pain Hematologic:  [] Easy bruising  [] Easy bleeding   [] Hypercoagulable state   [] Anemic  [] Hepatitis Gastrointestinal:  [] Blood in stool   [] Vomiting blood  [x] Gastroesophageal reflux/heartburn   [] Abdominal pain Genitourinary:  [] Chronic kidney disease   [] Difficult urination  [] Frequent urination  [] Burning with urination    [] Hematuria Skin:  [] Rashes   [] Ulcers   [] Wounds Psychological:  [] History of anxiety   []  History of major depression.      Physical Examination  BP 123/68 (BP Location: Right Arm)   Pulse 83   Resp 16  Ht 5\' 4"  (1.626 m)   Wt 70.8 kg (156 lb)   BMI 26.78 kg/m  Gen:  WD/WN, NAD.  Appears older than stated age Head: [place]/AT, No temporalis wasting. Ear/Nose/Throat: Hearing grossly intact, nares w/o erythema or drainage Eyes: Conjunctiva clear. Sclera non-icteric Neck: Supple.  Trachea midline Pulmonary:  Good air movement, no use of accessory muscles on supplemental oxygen.  Cardiac: RRR, no JVD Vascular:  Vessel Right Left  Radial Palpable Palpable                                   Gastrointestinal: soft, non-tender/non-distended.   Musculoskeletal: M/S 5/5 throughout.  No deformity or atrophy.  Trace lower extremity edema. Neurologic: Sensation grossly intact in extremities.  Symmetrical.  Speech is fluent.  Psychiatric: Judgment intact, Mood & affect appropriate for pt's clinical situation. Dermatologic: No rashes or ulcers noted.  No cellulitis or open wounds.       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 In addition, moderate LEFT M1 stenosis. Electronically Signed   By: Elon Alas M.D.   On: 01/15/2018 21:59   Result Date: 01/15/2018 CLINICAL DATA:  Vision changes, headache. Assess for carotid artery stenosis. EXAM: CT ANGIOGRAPHY NECK TECHNIQUE: Multidetector CT imaging of the neck was performed using the standard protocol during bolus administration of intravenous contrast. Multiplanar  CT image reconstructions and MIPs were obtained to evaluate the vascular anatomy. Carotid stenosis measurements (when applicable) are obtained utilizing NASCET criteria, using the distal internal carotid diameter as the denominator. CONTRAST:  76mL OMNIPAQUE IOHEXOL 350 MG/ML SOLN COMPARISON:  None. FINDINGS: AORTIC ARCH: Normal appearance of the thoracic arch, Common origin innominate artery and LEFT Common carotid artery. Moderate calcific atherosclerosis aortic arch. The origins of the innominate, left Common carotid artery and subclavian artery are patent. Moderate stenosis LEFT mid subclavian artery. RIGHT CAROTID SYSTEM: Mild stenosis RIGHT Common carotid artery origin due to calcific atherosclerosis. Common carotid artery is patent with mild calcific atherosclerosis. Approximate 15 mm segment of RIGHT internal carotid artery origin critical stenosis by NASCET criteria due to severe calcific atherosclerosis. The remainder of the internal carotid artery is patent. LEFT CAROTID SYSTEM: Common carotid artery is patent, mild distal stenosis due to calcific atherosclerosis. Moderate calcific atherosclerosis LEFT carotid bifurcation. Occluded LEFT internal carotid artery within 1 cm of the origin. No reconstitution in the neck, reconstitution at LEFT cavernous segment. Hypoplastic LEFT A1 segment. 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 Moderate stenosis RIGHT vertebral artery origin. Tandem mild stenosis due to atherosclerosis bilateral vertebral artery's which are patent. Severe stenosis RIGHT focal severe stenosis RIGHT V4 segment due to atherosclerosis. Tiny posterior communicating arteries present. SKELETON: No acute osseous process though bone windows have not been submitted. Patient is edentulous. Minimal old T1, T2 superior endplate compression fractures. Osteopenia. OTHER NECK: Soft tissues of the neck are nonacute though,  not tailored for evaluation. UPPER CHEST: 7 mm solid LEFT upper lobe pulmonary nodule (series 6, image 15). Mild centrilobular emphysema. IMPRESSION: 1. Critical stenosis  RIGHT internal carotid artery. 2. Occluded LEFT internal carotid artery, likely chronic with reconstitution at cavernous segment. 3. Moderate stenosis RIGHT vertebral artery origin. Severe stenosis RIGHT V4 segment. 4. 5 mm LEFT A-comm aneurysm.  Recommend CTA HEAD. 5. **An incidental finding of potential clinical significance has been found. 7 mm LEFT upper lobe pulmonary nodule. Non-contrast chest CT at 6-12 months is recommended. If the nodule is stable at time of repeat CT, then future CT at 18-24 months (from today's scan) is considered optional for low-risk patients, but is recommended for high-risk patients. This recommendation follows the consensus statement: Guidelines for Management of Incidental Pulmonary Nodules Detected on CT Images: From the Fleischner Society 2017; Radiology 2017; 284:228-243.** Aortic Atherosclerosis (ICD10-I70.0) and Emphysema (ICD10-J43.9). Electronically Signed: By: Elon Alas M.D. On: 01/14/2018 17:27    Assessment/Plan BP (high blood pressure) blood pressure control important in reducing the progression of atherosclerotic disease. On appropriate oral medications.   Chronic obstructive pulmonary disease ([place]) On home oxygen.  We would have to have her pulmonologist to determine whether or not she would be a surgical candidate.  Given her contralateral occlusion, high lesion, and severe pulmonary disease, I think a carotid stent would be in her best interest.  Chronic pain On a fentanyl patch   Carotid stenosis I have independently reviewed her CT angiogram.  This demonstrates a critical right carotid artery stenosis of 90% or greater by my interpretation.  She has a left internal carotid artery occlusion.  She also has intracranial disease. This is a very difficult and complex  situation.  Although her lesion is significantly calcified, it is a very high lesion that will be difficult to access surgically.  Also, the contralateral occlusion makes this high risk for open surgery.  Likely the biggest risk is her severe COPD with oxygen dependence and putting her to sleep would be very risky.  For these reasons, carotid stent is likely her best option.  She is a very high risk candidate and I have quoted her at least a 5% stroke risk.  She has a much higher stroke risk or death risk if left alone and she does have symptoms of pancerebral hypoperfusion.  She wants to have something done.  A right carotid stent placement will be scheduled for next week.  We will start her on Plavix today and a prescription was given.  The risks and the benefits were discussed with she and her brother in detail and she agrees to proceed.     Leotis Pain, MD  01/19/2018 10:13 AM    This note was created with Dragon medical transcription system.  Any errors from dictation are purely unintentional

## 2018-01-19 NOTE — Assessment & Plan Note (Signed)
I have independently reviewed her CT angiogram.  This demonstrates a critical right carotid artery stenosis of 90% or greater by my interpretation.  She has a left internal carotid artery occlusion.  She also has intracranial disease. This is a very difficult and complex situation.  Although her lesion is significantly calcified, it is a very high lesion that will be difficult to access surgically.  Also, the contralateral occlusion makes this high risk for open surgery.  Likely the biggest risk is her severe COPD with oxygen dependence and putting her to sleep would be very risky.  For these reasons, carotid stent is likely her best option.  She is a very high risk candidate and I have quoted her at least a 5% stroke risk.  She has a much higher stroke risk or death risk if left alone and she does have symptoms of pancerebral hypoperfusion.  She wants to have something done.  A right carotid stent placement will be scheduled for next week.  We will start her on Plavix today and a prescription was given.  The risks and the benefits were discussed with she and her brother in detail and she agrees to proceed.

## 2018-01-19 NOTE — Patient Instructions (Signed)
Carotid Angioplasty With Stent Carotid angioplasty is a procedure to open or widen an artery in the neck (carotid artery) that is blocked or has become narrow. This is done by using a small piece of metal that looks like a coil or spring (stent). The stent helps keep the artery open by supporting the artery walls. The carotid arteries supply blood to the brain. When fats, cholesterol, and other materials (plaque) build up in an artery, the artery becomes narrow and can become blocked. This can reduce or block blood flow to certain areas of the brain, which can cause serious health problems, including stroke. Tell a health care provider about:  Any allergies you have.  All medicines you are taking, including vitamins, herbs, eye drops, creams, and over-the-counter medicines.  Any problems you or family members have had with anesthetic medicines.  Any blood disorders you have.  Any surgeries you have had.  Any medical conditions you have.  Whether you are pregnant or may be pregnant. What are the risks? Generally, this is a safe procedure. However, problems may occur, including:  Infection.  Bleeding.  Allergic reactions to medicines or dyes.  Damage to other structures or organs, or the carotid artery itself.  The carotid artery becoming blocked again.  A collection of blood under the skin (hematoma) around the stent site that gets larger (expands).  Blood clot in another part of the body.  Kidney injury.  What happens before the procedure?  Ask your health care provider about: ? Changing or stopping your regular medicines. This is especially important if you are taking diabetes medicines or blood thinners. ? Taking medicines such as aspirin and ibuprofen. These medicines can thin your blood. Do not take these medicines before your procedure if your health care provider instructs you not to.  Follow instructions from your health care provider about eating or drinking  restrictions.  Do not use any tobacco products for at least 24 hours before your procedure. This includes cigarettes, chewing tobacco, or e-cigarettes.  Ask your health care provider how your surgical site will be marked or identified.  You may be given antibiotic medicine to help prevent infection.  You may have blood tests done.  Plan to have someone take you home after the procedure.  If you will be going home right after the procedure, plan to have someone with you for 24 hours. What happens during the procedure?  To reduce your risk of infection: ? Your health care team will wash or sanitize their hands. ? Your skin will be washed with soap.  An IV tube will be inserted into one of your veins.  You will be given one or more of the following: ? A medicine to help you relax (sedative). ? A medicine to make you fall asleep (general anesthetic).  An cut (incision) will be made. Most commonly, an incision will be made in your groin. In some cases, an incision may be made in your wrist or forearm instead of your groin.  A small, flexible tube (catheter) will be inserted through your incision, into an artery. The catheter will be threaded upward into your carotid artery. An X-ray machine (fluoroscope) will help your health care provider guide the catheter to the correct place in your artery.  Dye will be injected into the catheter and will travel to the narrow or blocked part of your carotid artery.  X-ray images will be taken of how the dye flows through your artery. While images are being taken,   you may be given instructions about breathing, swallowing, moving, or talking.  A filter (distal protection device) will be inserted into your artery. This will be used to catch plaque that comes loose in your artery during the procedure. This prevents plaque from moving into your brain.  A small balloon will be inserted into your artery. The balloon will be inflated for a few seconds to  widen your artery and then removed.  The stent will be placed in your artery.  A second small balloon will be inserted into your artery and inflated. This expands the stent inside of your artery, so that the stent holds up the artery walls. The balloon will then be removed.  The catheter and the distal protection device will be removed from your artery.  Your incision may be closed with stitches (sutures), skin glue, or adhesive tape.  A bandage (dressing) will be placed over your incision. The procedure may vary among health care providers and hospitals. What happens after the procedure?  Your blood pressure, heart rate, breathing rate, and blood oxygen level will be monitored often until the medicines you were given have worn off.  You may continue to receive fluids and medicines through an IV tube.  You may have some pain. Pain medicines will be available to help you.  You may have X-rays to make sure that the stent is in the correct place.  You may have to wear compression stockings. These stockings help to prevent blood clots and reduce swelling in your legs.  Do not drive for 24 hours if you received a sedative. This information is not intended to replace advice given to you by your health care provider. Make sure you discuss any questions you have with your health care provider. Document Released: 02/03/2005 Document Revised: 02/28/2016 Document Reviewed: 06/17/2015 [employer]vier Interactive Patient Education  2018 [employer]  

## 2018-01-22 ENCOUNTER — Encounter
Admission: RE | Admit: 2018-01-22 | Discharge: 2018-01-22 | Disposition: A | Discharge status: Home / Self Care | Payer: Medicare Other | Source: Ambulatory Visit | Attending: Vascular Surgery | Admitting: Vascular Surgery

## 2018-01-22 HISTORY — DX: Gastro-esophageal reflux disease without esophagitis: K21.9

## 2018-01-22 LAB — CREATININE, SERUM
Creatinine, Ser: 0.47 mg/dL (ref 0.44–1.00)
GFR calc Af Amer: >60 mL/min (ref >60)
GFR calc non Af Amer: >60 mL/min (ref >60)

## 2018-01-22 LAB — BUN: BUN: 10 mg/dL (ref 6–20)

## 2018-01-24 MED ORDER — CEFAZOLIN SODIUM-DEXTROSE 2-4 GM/100ML-% IV SOLN
2.0000 g | Freq: Once | INTRAVENOUS | Status: AC
  Administered 2018-01-25: 2 g via INTRAVENOUS

## 2018-01-25 ENCOUNTER — Inpatient Hospital Stay
Admission: AD | Admit: 2018-01-25 | Discharge: 2018-01-27 | DRG: 036 | Disposition: A | Discharge status: Home Health | Payer: Medicare Other | Source: Ambulatory Visit | Attending: Vascular Surgery | Admitting: Vascular Surgery

## 2018-01-25 ENCOUNTER — Other Ambulatory Visit: Payer: Self-pay

## 2018-01-25 ENCOUNTER — Encounter
Admission: AD | Disposition: A | Discharge status: Home Health | Payer: Self-pay | Source: Ambulatory Visit | Attending: Vascular Surgery

## 2018-01-25 ENCOUNTER — Encounter: Payer: Self-pay | Admitting: Emergency Medicine

## 2018-01-25 DIAGNOSIS — Z7982 Long term (current) use of aspirin: Secondary | ICD-10-CM

## 2018-01-25 DIAGNOSIS — F329 Major depressive disorder, single episode, unspecified: Secondary | ICD-10-CM | POA: Diagnosis present

## 2018-01-25 DIAGNOSIS — I6529 Occlusion and stenosis of unspecified carotid artery: Secondary | ICD-10-CM

## 2018-01-25 DIAGNOSIS — Z9981 Dependence on supplemental oxygen: Secondary | ICD-10-CM | POA: Diagnosis not present

## 2018-01-25 DIAGNOSIS — Z881 Allergy status to other antibiotic agents status: Secondary | ICD-10-CM

## 2018-01-25 DIAGNOSIS — Z791 Long term (current) use of non-steroidal anti-inflammatories (NSAID): Secondary | ICD-10-CM

## 2018-01-25 DIAGNOSIS — F17200 Nicotine dependence, unspecified, uncomplicated: Secondary | ICD-10-CM | POA: Diagnosis present

## 2018-01-25 DIAGNOSIS — Z7951 Long term (current) use of inhaled steroids: Secondary | ICD-10-CM

## 2018-01-25 DIAGNOSIS — K219 Gastro-esophageal reflux disease without esophagitis: Secondary | ICD-10-CM | POA: Diagnosis present

## 2018-01-25 DIAGNOSIS — I10 Essential (primary) hypertension: Secondary | ICD-10-CM | POA: Diagnosis present

## 2018-01-25 DIAGNOSIS — Z79899 Other long term (current) drug therapy: Secondary | ICD-10-CM | POA: Diagnosis not present

## 2018-01-25 DIAGNOSIS — Z01812 Encounter for preprocedural laboratory examination: Secondary | ICD-10-CM | POA: Diagnosis not present

## 2018-01-25 DIAGNOSIS — J439 Emphysema, unspecified: Secondary | ICD-10-CM | POA: Diagnosis present

## 2018-01-25 DIAGNOSIS — G8929 Other chronic pain: Secondary | ICD-10-CM | POA: Diagnosis present

## 2018-01-25 DIAGNOSIS — Z79890 Hormone replacement therapy (postmenopausal): Secondary | ICD-10-CM | POA: Diagnosis not present

## 2018-01-25 DIAGNOSIS — J9600 Acute respiratory failure, unspecified whether with hypoxia or hypercapnia: Secondary | ICD-10-CM

## 2018-01-25 DIAGNOSIS — I6523 Occlusion and stenosis of bilateral carotid arteries: Secondary | ICD-10-CM | POA: Diagnosis present

## 2018-01-25 DIAGNOSIS — I6521 Occlusion and stenosis of right carotid artery: Secondary | ICD-10-CM | POA: Diagnosis present

## 2018-01-25 HISTORY — PX: CAROTID PTA/STENT INTERVENTION: CATH118231

## 2018-01-25 LAB — MRSA PCR SCREENING: MRSA by PCR: NEGATIVE

## 2018-01-25 LAB — GLUCOSE, CAPILLARY: Glucose-Capillary: 121 mg/dL — ABNORMAL HIGH (ref 65–99)

## 2018-01-25 SURGERY — CAROTID PTA/STENT INTERVENTION
Anesthesia: Moderate Sedation | Laterality: Right

## 2018-01-25 MED ORDER — HEPARIN SODIUM (PORCINE) 1000 UNIT/ML IJ SOLN
INTRAMUSCULAR | Status: AC
  Filled 2018-01-25: qty 1

## 2018-01-25 MED ORDER — FENTANYL CITRATE (PF) 100 MCG/2ML IJ SOLN
INTRAMUSCULAR | Status: AC
  Filled 2018-01-25: qty 2

## 2018-01-25 MED ORDER — CLOPIDOGREL BISULFATE 75 MG PO TABS
75.0000 mg | ORAL_TABLET | Freq: Every day | ORAL | Status: DC
  Administered 2018-01-25 – 2018-01-27 (×3): 75 mg via ORAL
  Filled 2018-01-25 (×3): qty 1

## 2018-01-25 MED ORDER — LIDOCAINE-EPINEPHRINE (PF) 1 %-1:200000 IJ SOLN
INTRAMUSCULAR | Status: AC
  Filled 2018-01-25: qty 30

## 2018-01-25 MED ORDER — SODIUM CHLORIDE 0.9 % IV SOLN
INTRAVENOUS | Status: AC | PRN
  Administered 2018-01-25: 200 mL/h via INTRAVENOUS

## 2018-01-25 MED ORDER — BUSPIRONE HCL 10 MG PO TABS
5.0000 mg | ORAL_TABLET | Freq: Three times a day (TID) | ORAL | Status: DC
  Administered 2018-01-25 – 2018-01-27 (×6): 5 mg via ORAL
  Filled 2018-01-25 (×8): qty 1

## 2018-01-25 MED ORDER — ATROPINE SULFATE 1 MG/10ML IJ SOSY
PREFILLED_SYRINGE | INTRAMUSCULAR | Status: DC | PRN
  Administered 2018-01-25: .4 mg via INTRAVENOUS

## 2018-01-25 MED ORDER — MIDAZOLAM HCL 2 MG/2ML IJ SOLN
INTRAMUSCULAR | Status: DC | PRN
  Administered 2018-01-25: 1 mg via INTRAVENOUS
  Administered 2018-01-25 (×3): 2 mg via INTRAVENOUS
  Administered 2018-01-25 (×2): 1 mg via INTRAVENOUS

## 2018-01-25 MED ORDER — PHENOL 1.4 % MT LIQD
1.0000 | OROMUCOSAL | Status: DC | PRN
  Filled 2018-01-25: qty 177

## 2018-01-25 MED ORDER — HYDROMORPHONE HCL 1 MG/ML IJ SOLN: 1.0000 mg | Freq: Once | INTRAMUSCULAR | Status: DC | PRN

## 2018-01-25 MED ORDER — PHENYLEPHRINE 40 MCG/ML (10ML) SYRINGE FOR IV PUSH (FOR BLOOD PRESSURE SUPPORT)
10.0000 ug | PREFILLED_SYRINGE | INTRAVENOUS | Status: DC | PRN
  Filled 2018-01-25: qty 5

## 2018-01-25 MED ORDER — MIDAZOLAM HCL 5 MG/5ML IJ SOLN
INTRAMUSCULAR | Status: AC
  Filled 2018-01-25: qty 5

## 2018-01-25 MED ORDER — HEPARIN (PORCINE) IN NACL 1000-0.9 UT/500ML-% IV SOLN
INTRAVENOUS | Status: AC
  Filled 2018-01-25: qty 1000

## 2018-01-25 MED ORDER — SULFAMETHOXAZOLE-TRIMETHOPRIM 800-160 MG PO TABS
2.0000 | ORAL_TABLET | Freq: Two times a day (BID) | ORAL | Status: DC
  Filled 2018-01-25 (×2): qty 2

## 2018-01-25 MED ORDER — ONDANSETRON HCL 4 MG/2ML IJ SOLN: 4.0000 mg | Freq: Four times a day (QID) | INTRAMUSCULAR | Status: DC | PRN

## 2018-01-25 MED ORDER — CLOPIDOGREL BISULFATE 75 MG PO TABS: 150.0000 mg | ORAL_TABLET | Freq: Once | ORAL | Status: DC

## 2018-01-25 MED ORDER — METOPROLOL TARTRATE 5 MG/5ML IV SOLN: 2.0000 mg | INTRAVENOUS | Status: DC | PRN

## 2018-01-25 MED ORDER — FAMOTIDINE IN NACL 20-0.9 MG/50ML-% IV SOLN
20.0000 mg | Freq: Two times a day (BID) | INTRAVENOUS | Status: DC
  Administered 2018-01-25: 20 mg via INTRAVENOUS
  Filled 2018-01-25 (×2): qty 50

## 2018-01-25 MED ORDER — QUETIAPINE FUMARATE 25 MG PO TABS
100.0000 mg | ORAL_TABLET | Freq: Every day | ORAL | Status: DC
  Administered 2018-01-25 – 2018-01-26 (×2): 100 mg via ORAL
  Filled 2018-01-25: qty 4
  Filled 2018-01-25 (×2): qty 1

## 2018-01-25 MED ORDER — SODIUM CHLORIDE 0.9 % IV SOLN
INTRAVENOUS | Status: DC
  Administered 2018-01-25: 10:00:00 via INTRAVENOUS

## 2018-01-25 MED ORDER — LIOTHYRONINE SODIUM 25 MCG PO TABS
50.0000 ug | ORAL_TABLET | Freq: Every day | ORAL | Status: DC
  Administered 2018-01-26 – 2018-01-27 (×2): 50 ug via ORAL
  Filled 2018-01-25 (×2): qty 2

## 2018-01-25 MED ORDER — HEPARIN SODIUM (PORCINE) 1000 UNIT/ML IJ SOLN
INTRAMUSCULAR | Status: DC | PRN
  Administered 2018-01-25: 6000 [IU] via INTRAVENOUS
  Administered 2018-01-25: 2000 [IU] via INTRAVENOUS

## 2018-01-25 MED ORDER — LOSARTAN POTASSIUM-HCTZ 100-25 MG PO TABS: 1.0000 | ORAL_TABLET | Freq: Every day | ORAL | Status: DC

## 2018-01-25 MED ORDER — ACETAMINOPHEN 325 MG PO TABS: 325.0000 mg | ORAL_TABLET | ORAL | Status: DC | PRN

## 2018-01-25 MED ORDER — MONTELUKAST SODIUM 10 MG PO TABS
10.0000 mg | ORAL_TABLET | Freq: Every day | ORAL | Status: DC
  Administered 2018-01-26 – 2018-01-27 (×2): 10 mg via ORAL
  Filled 2018-01-25 (×2): qty 1

## 2018-01-25 MED ORDER — ONDANSETRON HCL 4 MG/2ML IJ SOLN
INTRAMUSCULAR | Status: AC
  Filled 2018-01-25: qty 2

## 2018-01-25 MED ORDER — MORPHINE SULFATE (PF) 4 MG/ML IV SOLN
2.0000 mg | INTRAVENOUS | Status: DC | PRN
  Filled 2018-01-25: qty 1

## 2018-01-25 MED ORDER — SODIUM CHLORIDE 0.9 % IV SOLN: 500.0000 mL | Freq: Once | INTRAVENOUS | Status: DC | PRN

## 2018-01-25 MED ORDER — THEOPHYLLINE ER 400 MG PO TB24
400.0000 mg | ORAL_TABLET | Freq: Every day | ORAL | Status: DC
  Administered 2018-01-26 – 2018-01-27 (×2): 400 mg via ORAL
  Filled 2018-01-25 (×3): qty 1

## 2018-01-25 MED ORDER — MOMETASONE FURO-FORMOTEROL FUM 200-5 MCG/ACT IN AERO: 2.0000 | INHALATION_SPRAY | Freq: Two times a day (BID) | RESPIRATORY_TRACT | Status: DC

## 2018-01-25 MED ORDER — SODIUM CHLORIDE 0.9 % IV SOLN
INTRAVENOUS | Status: DC
  Administered 2018-01-25: 100 mL/h via INTRAVENOUS
  Administered 2018-01-26: 03:00:00 via INTRAVENOUS

## 2018-01-25 MED ORDER — FLUTICASONE PROPIONATE 50 MCG/ACT NA SUSP
1.0000 | Freq: Every day | NASAL | Status: DC
  Administered 2018-01-26 – 2018-01-27 (×2): 1 via NASAL
  Filled 2018-01-25: qty 16

## 2018-01-25 MED ORDER — HYDROCHLOROTHIAZIDE 25 MG PO TABS
25.0000 mg | ORAL_TABLET | Freq: Every day | ORAL | Status: DC
  Administered 2018-01-27: 25 mg via ORAL
  Filled 2018-01-25 (×2): qty 1

## 2018-01-25 MED ORDER — MAGNESIUM SULFATE 2 GM/50ML IV SOLN: 2.0000 g | Freq: Every day | INTRAVENOUS | Status: DC | PRN

## 2018-01-25 MED ORDER — LABETALOL HCL 5 MG/ML IV SOLN: 10.0000 mg | INTRAVENOUS | Status: DC | PRN

## 2018-01-25 MED ORDER — ALBUTEROL SULFATE (2.5 MG/3ML) 0.083% IN NEBU
2.5000 mg | INHALATION_SOLUTION | Freq: Four times a day (QID) | RESPIRATORY_TRACT | Status: DC | PRN
  Administered 2018-01-26 – 2018-01-27 (×2): 2.5 mg via RESPIRATORY_TRACT
  Filled 2018-01-25 (×2): qty 3

## 2018-01-25 MED ORDER — SODIUM CHLORIDE 0.9 % IV SOLN
Freq: Once | INTRAVENOUS | Status: AC
  Administered 2018-01-25: 14:00:00 via INTRAVENOUS

## 2018-01-25 MED ORDER — SODIUM CHLORIDE 0.9 % IV SOLN
0.0000 ug/min | INTRAVENOUS | Status: DC
  Administered 2018-01-25: 10 ug/min via INTRAVENOUS
  Administered 2018-01-25: 60 ug/min via INTRAVENOUS
  Administered 2018-01-25 (×2): 50 ug/min via INTRAVENOUS
  Administered 2018-01-26: 40 ug/min via INTRAVENOUS
  Filled 2018-01-25: qty 1
  Filled 2018-01-25 (×3): qty 10
  Filled 2018-01-25: qty 1
  Filled 2018-01-25: qty 10

## 2018-01-25 MED ORDER — ASPIRIN EC 81 MG PO TBEC
81.0000 mg | DELAYED_RELEASE_TABLET | Freq: Every day | ORAL | Status: DC
  Administered 2018-01-26 – 2018-01-27 (×2): 81 mg via ORAL
  Filled 2018-01-25 (×2): qty 1

## 2018-01-25 MED ORDER — PHENYLEPHRINE HCL 10 MG/ML IJ SOLN
INTRAMUSCULAR | Status: AC
  Filled 2018-01-25: qty 1

## 2018-01-25 MED ORDER — OXYCODONE HCL 5 MG PO TABS
15.0000 mg | ORAL_TABLET | Freq: Four times a day (QID) | ORAL | Status: DC | PRN
  Administered 2018-01-25 – 2018-01-27 (×4): 15 mg via ORAL
  Filled 2018-01-25 (×4): qty 3

## 2018-01-25 MED ORDER — METHYLPREDNISOLONE SODIUM SUCC 125 MG IJ SOLR
125.0000 mg | INTRAMUSCULAR | Status: DC | PRN
Start: 2018-01-25 — End: 2018-01-25

## 2018-01-25 MED ORDER — DIAZEPAM 5 MG PO TABS
5.0000 mg | ORAL_TABLET | Freq: Three times a day (TID) | ORAL | Status: DC | PRN
  Administered 2018-01-25 – 2018-01-27 (×3): 5 mg via ORAL
  Filled 2018-01-25 (×3): qty 1

## 2018-01-25 MED ORDER — FAMOTIDINE 20 MG PO TABS: 40.0000 mg | ORAL_TABLET | ORAL | Status: DC | PRN

## 2018-01-25 MED ORDER — DOPAMINE-DEXTROSE 3.2-5 MG/ML-% IV SOLN
0.0000 ug/kg/min | INTRAVENOUS | Status: DC
  Administered 2018-01-25: 6 ug/kg/min via INTRAVENOUS
  Administered 2018-01-25: 2 ug/kg/min via INTRAVENOUS
  Administered 2018-01-25: 10 ug/kg/min via INTRAVENOUS
  Filled 2018-01-25: qty 250

## 2018-01-25 MED ORDER — ACETAMINOPHEN 325 MG RE SUPP
325.0000 mg | RECTAL | Status: DC | PRN
  Filled 2018-01-25: qty 2

## 2018-01-25 MED ORDER — MELOXICAM 15 MG PO TABS
15.0000 mg | ORAL_TABLET | Freq: Every day | ORAL | Status: DC
  Filled 2018-01-25: qty 1

## 2018-01-25 MED ORDER — GUAIFENESIN-DM 100-10 MG/5ML PO SYRP
15.0000 mL | ORAL_SOLUTION | ORAL | Status: DC | PRN
  Administered 2018-01-27: 15 mL via ORAL
  Filled 2018-01-25 (×2): qty 15

## 2018-01-25 MED ORDER — PHENYLEPHRINE 40 MCG/ML (10ML) SYRINGE FOR IV PUSH (FOR BLOOD PRESSURE SUPPORT): 4.0000 ug | PREFILLED_SYRINGE | Freq: Once | INTRAVENOUS | Status: DC

## 2018-01-25 MED ORDER — FENTANYL CITRATE (PF) 100 MCG/2ML IJ SOLN
INTRAMUSCULAR | Status: DC | PRN
  Administered 2018-01-25 (×2): 25 ug via INTRAVENOUS
  Administered 2018-01-25 (×4): 50 ug via INTRAVENOUS

## 2018-01-25 MED ORDER — ALUM & MAG HYDROXIDE-SIMETH 200-200-20 MG/5ML PO SUSP
15.0000 mL | ORAL | Status: DC | PRN
  Filled 2018-01-25: qty 30

## 2018-01-25 MED ORDER — IOPAMIDOL (ISOVUE-300) INJECTION 61%
INTRAVENOUS | Status: DC | PRN
  Administered 2018-01-25: 85 mL via INTRAVENOUS

## 2018-01-25 MED ORDER — FAMOTIDINE 20 MG PO TABS: 10.0000 mg | ORAL_TABLET | Freq: Every day | ORAL | Status: DC

## 2018-01-25 MED ORDER — MIDAZOLAM HCL 2 MG/ML PO SYRP
4.0000 mg | ORAL_SOLUTION | Freq: Once | ORAL | Status: AC
  Administered 2018-01-25: 4 mg via ORAL

## 2018-01-25 MED ORDER — MIDAZOLAM HCL 2 MG/ML PO SYRP
ORAL_SOLUTION | ORAL | Status: AC
  Administered 2018-01-25: 4 mg via ORAL
  Filled 2018-01-25: qty 4

## 2018-01-25 MED ORDER — PHENYLEPHRINE 40 MCG/ML (10ML) SYRINGE FOR IV PUSH (FOR BLOOD PRESSURE SUPPORT)
2.0000 ug | PREFILLED_SYRINGE | INTRAVENOUS | Status: DC | PRN
  Administered 2018-01-25: 4 ug via INTRAVENOUS
  Administered 2018-01-25: 6 ug via INTRAVENOUS
  Administered 2018-01-25: 4 ug via INTRAVENOUS
  Filled 2018-01-25 (×4): qty 5

## 2018-01-25 MED ORDER — TIOTROPIUM BROMIDE MONOHYDRATE 18 MCG IN CAPS: 18.0000 ug | ORAL_CAPSULE | Freq: Every day | RESPIRATORY_TRACT | Status: DC

## 2018-01-25 MED ORDER — LOSARTAN POTASSIUM 50 MG PO TABS
100.0000 mg | ORAL_TABLET | Freq: Every day | ORAL | Status: DC
  Administered 2018-01-27: 100 mg via ORAL
  Filled 2018-01-25 (×2): qty 2

## 2018-01-25 MED ORDER — HYDRALAZINE HCL 20 MG/ML IJ SOLN: 5.0000 mg | INTRAMUSCULAR | Status: DC | PRN

## 2018-01-25 MED ORDER — SERTRALINE HCL 50 MG PO TABS
100.0000 mg | ORAL_TABLET | Freq: Every day | ORAL | Status: DC
  Administered 2018-01-26 – 2018-01-27 (×2): 100 mg via ORAL
  Filled 2018-01-25: qty 1
  Filled 2018-01-25 (×2): qty 2
  Filled 2018-01-25: qty 1

## 2018-01-25 MED ORDER — ORAL CARE MOUTH RINSE
15.0000 mL | Freq: Two times a day (BID) | OROMUCOSAL | Status: DC
  Administered 2018-01-25 – 2018-01-27 (×4): 15 mL via OROMUCOSAL

## 2018-01-25 MED ORDER — FENTANYL 25 MCG/HR TD PT72: 25.0000 ug | MEDICATED_PATCH | TRANSDERMAL | Status: DC

## 2018-01-25 MED ORDER — CEFAZOLIN SODIUM-DEXTROSE 2-4 GM/100ML-% IV SOLN
2.0000 g | Freq: Three times a day (TID) | INTRAVENOUS | Status: AC
  Administered 2018-01-25 (×2): 2 g via INTRAVENOUS
  Filled 2018-01-25 (×2): qty 100

## 2018-01-25 MED ORDER — ONDANSETRON HCL 4 MG/2ML IJ SOLN
4.0000 mg | Freq: Once | INTRAMUSCULAR | Status: AC
  Administered 2018-01-25: 4 mg via INTRAVENOUS

## 2018-01-25 MED ORDER — LORATADINE 10 MG PO TABS
10.0000 mg | ORAL_TABLET | Freq: Every day | ORAL | Status: DC
  Administered 2018-01-26 – 2018-01-27 (×2): 10 mg via ORAL
  Filled 2018-01-25 (×2): qty 1

## 2018-01-25 MED ORDER — MORPHINE SULFATE (PF) 4 MG/ML IV SOLN
2.0000 mg | INTRAVENOUS | Status: DC | PRN
  Administered 2018-01-26: 2 mg via INTRAVENOUS
  Filled 2018-01-25: qty 1

## 2018-01-25 MED ORDER — MELOXICAM 7.5 MG PO TABS
15.0000 mg | ORAL_TABLET | Freq: Every day | ORAL | Status: DC
  Administered 2018-01-25 – 2018-01-27 (×3): 15 mg via ORAL
  Filled 2018-01-25 (×3): qty 2

## 2018-01-25 MED ORDER — ATROPINE SULFATE 1 MG/10ML IJ SOSY
PREFILLED_SYRINGE | INTRAMUSCULAR | Status: AC
  Filled 2018-01-25: qty 10

## 2018-01-25 MED ORDER — UMECLIDINIUM-VILANTEROL 62.5-25 MCG/INH IN AEPB
1.0000 | INHALATION_SPRAY | Freq: Every day | RESPIRATORY_TRACT | Status: DC
  Administered 2018-01-26 – 2018-01-27 (×2): 1 via RESPIRATORY_TRACT
  Filled 2018-01-25: qty 14

## 2018-01-25 MED ORDER — POTASSIUM CHLORIDE CRYS ER 20 MEQ PO TBCR
20.0000 meq | EXTENDED_RELEASE_TABLET | Freq: Every day | ORAL | Status: AC | PRN
  Administered 2018-01-26: 40 meq via ORAL
  Filled 2018-01-25: qty 2

## 2018-01-25 SURGICAL SUPPLY — 32 items
BALLN STERLING OTW 4X40X135 (BALLOONS) ×3
BALLN TREK RX 2.5X15 (BALLOONS) ×3
BALLN TREK RX 2.5X20 (BALLOONS)
BALLN VIATRAC 5X20X135 (BALLOONS) ×3
BALLN VIATRAC 6X20X135 (BALLOONS) ×3
BALLOON STERLING OTW 4X40X135 (BALLOONS) ×1 IMPLANT
BALLOON TREK RX 2.5X15 (BALLOONS) ×1 IMPLANT
BALLOON TREK RX 2.5X20 (BALLOONS) IMPLANT
BALLOON VIATRAC 5X20X135 (BALLOONS) ×1 IMPLANT
BALLOON VIATRAC 6X20X135 (BALLOONS) ×1 IMPLANT
CANNULA 5F STIFF (CANNULA) ×3 IMPLANT
CATH ANGIO 5F 100CM .035 PIG (CATHETERS) ×3 IMPLANT
CATH BEACON 5 .035 100 H1 TIP (CATHETERS) ×3 IMPLANT
DEVICE EMBOSHIELD NAV6 4.0-7.0 (FILTER) ×3 IMPLANT
DEVICE PRESTO INFLATION (MISCELLANEOUS) ×3 IMPLANT
DEVICE STARCLOSE SE CLOSURE (Vascular Products) ×3 IMPLANT
DEVICE TORQUE .014-.018 (MISCELLANEOUS) ×1 IMPLANT
DEVICE TORQUE .025-.038 (MISCELLANEOUS) ×3 IMPLANT
GLIDEWIRE STIFF .35X180X3 HYDR (WIRE) ×3 IMPLANT
GUIDEWIRE PFTE-COATED .018X300 (WIRE) ×3 IMPLANT
KIT CAROTID MANIFOLD (MISCELLANEOUS) ×3 IMPLANT
PACK ANGIOGRAPHY (CUSTOM PROCEDURE TRAY) ×3 IMPLANT
SHEATH BRITE TIP 6FRX11 (SHEATH) ×3 IMPLANT
SHEATH SHUTTLE 6FRX80 (SHEATH) ×3 IMPLANT
STENT XACT CAR 9-7X40X136 (Permanent Stent) ×3 IMPLANT
STENT XACT CAR 9X30X136 (Permanent Stent) ×3 IMPLANT
TORQUE DEVICE .014-.018 (MISCELLANEOUS) ×3
TUBING CONTRAST HIGH PRESS 72 (TUBING) ×3 IMPLANT
WIRE AMPLATZ SSTIFF .035X260CM (WIRE) ×3 IMPLANT
WIRE AQUATRACK .035X260CM (WIRE) ×6 IMPLANT
WIRE BAREWIRE WORK .014X315CM (WIRE) ×3 IMPLANT
WIRE J 3MM .035X145CM (WIRE) ×3 IMPLANT

## 2018-01-25 NOTE — Op Note (Signed)
OPERATIVE NOTE DATE: 01/25/2018  PROCEDURE: 1.  Ultrasound guidance for vascular access right femoral artery 2.  Placement of a 9 mm diameter proximal 7 mm diameter distal 4 cm long tapered exact stent as well as a 9 mm diameter proximal extender with the use of the NAV-6 embolic protection device in the right carotid carotid artery  PRE-OPERATIVE DIAGNOSIS: 1.  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 2.  Left carotid artery occlusion 3.  Severe, oxygen dependent COPD 4.  Symptoms of pan cerebral hypoperfusion  POST-OPERATIVE DIAGNOSIS:  Same as above  SURGEON: Leotis Pain, MD  ASSISTANT(S):  none  ANESTHESIA: local/MCS  ESTIMATED BLOOD LOSS:  50 cc  CONTRAST: 85 cc  FLUORO TIME: 15.6 minutes  MODERATE CONSCIOUS SEDATION TIME:  Approximately 75 minutes using 9 mg of Versed and 250 mcg of Fentanyl  FINDING(S): 1.   Greater than 95% right internal carotid artery stenosis, occlusion of the right external carotid artery  SPECIMEN(S):   none  INDICATIONS:   Patient is a 67 y.o. female who presents with high-grade right carotid artery stenosis.  She has symptoms of pain and cerebral hypoperfusion with lethargy and fatigue as well as dizziness and confusion.  The patient has a high cervical bifurcation on the right, severe oxygen dependent COPD, and a left carotid occlusion and carotid artery stenting was felt to be preferred to endarterectomy for that reason.  Risks and benefits were discussed and informed consent was obtained.   DESCRIPTION: After obtaining full informed written consent, the patient was brought back to the vascular suite and placed supine upon the table.  The patient received IV antibiotics prior to induction. Moderate conscious sedation was administered during a face to face encounter with the patient throughout the procedure with my supervision of the RN administering medicines and monitoring the patients vital signs and mental status throughout from the  start of the procedure until the patient was taken to the recovery room.  After obtaining adequate anesthesia, the patient was prepped and draped in the standard fashion.   The right femoral artery was visualized with ultrasound and found to be fully calcified but with flow. It was then accessed under direct ultrasound guidance without difficulty with a Seldinger needle. A permanent image was recorded. A J-wire was placed and we then placed a 6 French sheath. The patient was then heparinized and a total of 8000 units of intravenous heparin were given and an ACT was checked to confirm successful anticoagulation. A pigtail catheter was then placed into the ascending aorta. This showed a type II aortic arch with mild to moderate calcification of the origins of the great vessels without significant proximal stenosis. I then selectively cannulated the innominate artery without difficulty with a headhunter catheter and advanced into the mid right common carotid artery.  Imaging demonstrated a greater than 95% calcified right carotid artery stenosis in the internal carotid artery.  The external carotid artery was occluded.  Intracranial filling showed cross-filling right to left with no obvious focal deficits but reasonably pruned flow.  I then attempted to cross the lesion with the embolic protection device.  Initial attempts at advancing were met with loss of catheter back into the thoracic aorta.  I eventually used a telescoping technique with the headhunter catheter and a 6 [place] shuttle sheath and was able to advance the sheath into the distal common carotid artery on the right over the headhunter catheter. I then used the NAV-6  Embolic protection device but again  this would not cross the lesion.  I eventually decided to use a 0.018 advantage wire and crossed the lesion and parked this in the distal internal carotid artery at the base of the skull.  Predilatation was required and I initially started with a 2.5 mm  diameter balloon inflated to 8 atm.  The embolic protection wire would still not pass.  I then used a 4 mm diameter over the wire balloon and inflated this to 8 atm at the carotid bifurcation.  This was then advanced into the more distal internal carotid artery and the embolic protection wire was placed through this balloon and the balloon was removed.  The embolic protection device was deployed at the base of the skull.  The stent still would not pass, so we had to predilate the lesion with a 5 mm balloon over the embolic protection wire.  With this, there became a larger channel and a stent would not cross.  I then selected a 9 mm diameter proximal, 7 mm diameter distal 4 cm long exact stent. This was deployed, but given the long length of the lesion an extender was required.  A 9 mm diameter extender was placed proximally into the common carotid artery to entirely encompass the lesion. A 6 mm diameter by 2 cm length balloon was used to post dilate the stent. Only about a 20-25 % residual stenosis was present after angioplasty. Completion angiogram showed normal intracranial filling without new defects although imaging was poor with patient motion. At this point I elected to terminate the procedure. The sheath was removed and StarClose closure device was deployed in the right femoral artery with excellent hemostatic result. The patient was taken to the recovery room in stable condition having tolerated the procedure well.  COMPLICATIONS: none  CONDITION: stable  Leotis Pain 01/25/2018 1:09 PM   This note was created with Dragon Medical transcription system. Any errors in dictation are purely unintentional.

## 2018-01-25 NOTE — Progress Notes (Signed)
Foley catheter placed per hospital policy and connected to drainage bag.

## 2018-01-25 NOTE — H&P (Signed)
[facility] History & Physical Update  The patient was interviewed and re-examined.  The patient's previous History and Physical has been reviewed and is unchanged.  There is no change in the plan of care. We plan to proceed with the scheduled procedure.  Leotis Pain, MD  01/25/2018, 9:21 AM

## 2018-01-26 LAB — BASIC METABOLIC PANEL
Anion gap: 3 — ABNORMAL LOW (ref 5–15)
BUN: <5 mg/dL — ABNORMAL LOW (ref 6–20)
CO2: 34 mmol/L — ABNORMAL HIGH (ref 22–32)
Calcium: 8 mg/dL — ABNORMAL LOW (ref 8.9–10.3)
Chloride: 98 mmol/L — ABNORMAL LOW (ref 101–111)
Creatinine, Ser: 0.44 mg/dL (ref 0.44–1.00)
GFR calc Af Amer: >60 mL/min (ref >60)
GFR calc non Af Amer: >60 mL/min (ref >60)
Glucose, Bld: 76 mg/dL (ref 65–99)
Potassium: 3.2 mmol/L — ABNORMAL LOW (ref 3.5–5.1)
Sodium: 135 mmol/L (ref 135–145)

## 2018-01-26 LAB — POCT ACTIVATED CLOTTING TIME: Activated Clotting Time: 230 seconds

## 2018-01-26 LAB — CBC
HCT: 30.6 % — ABNORMAL LOW (ref 35.0–47.0)
Hemoglobin: 10.4 g/dL — ABNORMAL LOW (ref 12.0–16.0)
MCH: 33.8 pg (ref 26.0–34.0)
MCHC: 34.1 g/dL (ref 32.0–36.0)
MCV: 99.3 fL (ref 80.0–100.0)
Platelets: 235 10*3/uL (ref 150–440)
RBC: 3.08 MIL/uL — ABNORMAL LOW (ref 3.80–5.20)
RDW: 14.3 % (ref 11.5–14.5)
WBC: 4.6 10*3/uL (ref 3.6–11.0)

## 2018-01-26 LAB — GLUCOSE, CAPILLARY
Glucose-Capillary: 70 mg/dL (ref 65–99)
Glucose-Capillary: 82 mg/dL (ref 65–99)

## 2018-01-26 MED ORDER — CLOPIDOGREL BISULFATE 75 MG PO TABS: 150.0000 mg | ORAL_TABLET | Freq: Once | ORAL | 0 refills | Status: DC

## 2018-01-26 MED ORDER — FAMOTIDINE 20 MG PO TABS
20.0000 mg | ORAL_TABLET | Freq: Two times a day (BID) | ORAL | Status: DC
  Administered 2018-01-26 – 2018-01-27 (×3): 20 mg via ORAL
  Filled 2018-01-26 (×3): qty 1

## 2018-01-26 MED ORDER — ATORVASTATIN CALCIUM 10 MG PO TABS: 10.0000 mg | ORAL_TABLET | Freq: Every day | ORAL | 11 refills | Status: DC

## 2018-01-26 NOTE — Progress Notes (Signed)
[facility]  Daily Progress Note   Subjective  - 1 Day Post-Op  Hurting all over.  Has chronic pain and it just feels worse.  Some headache. Weaning of Neo and only on 5 mcg now.  Objective Vitals:   01/26/18 0600 01/26/18 0700 01/26/18 0743 01/26/18 0751  BP: 121/62 111/60  114/70  Pulse: 64 69  81  Resp: 14 19  17   Temp:   98.1 F (36.7 C)   TempSrc:   Oral   SpO2: 100% 97%  (!) 84%  Weight:      Height:        Intake/Output Summary (Last 24 hours) at 01/26/2018 0845 Last data filed at 01/26/2018 0601 Gross per 24 hour  Intake 2822.59 ml  Output 2485 ml  Net 337.59 ml    PULM  CTAB CV  RRR VASC  Access site C/D/I, neuro exam intact  Laboratory CBC    Component Value Date/Time   WBC 4.6 01/26/2018 0657   HGB 10.4 (L) 01/26/2018 0657   HGB 14.4 08/29/2013 0500   HCT 30.6 (L) 01/26/2018 0657   HCT 43.3 08/29/2013 0500   PLT 235 01/26/2018 0657   PLT 310 08/29/2013 0500    BMET    Component Value Date/Time   NA 135 01/26/2018 0657   NA 134 (L) 08/29/2013 0500   K 3.2 (L) 01/26/2018 0657   K 3.4 (L) 08/29/2013 0500   CL 98 (L) 01/26/2018 0657   CL 95 (L) 08/29/2013 0500   CO2 34 (H) 01/26/2018 0657   CO2 37 (H) 08/29/2013 0500   GLUCOSE 76 01/26/2018 0657   GLUCOSE 95 08/29/2013 0500   BUN <5 (L) 01/26/2018 0657   BUN 8 08/29/2013 0500   CREATININE 0.44 01/26/2018 0657   CREATININE 0.51 (L) 08/29/2013 0500   CALCIUM 8.0 (L) 01/26/2018 0657   CALCIUM 9.5 08/29/2013 0500   GFRNONAA >60 01/26/2018 0657   GFRNONAA >60 08/29/2013 0500   GFRAA >60 01/26/2018 0657   GFRAA >60 08/29/2013 0500    Assessment/Planning: POD #1 s/p right carotid stent   Wean off of new and see how her BP does  Tolerating diet  Increase activity  Likely home later today or tomorrow    Leotis Pain  01/26/2018, 8:45 AM

## 2018-01-26 NOTE — Progress Notes (Signed)
Assumed care of pt. Pt at EOB, in no acute distress. SOB on exertion. On 4L/[place]. No needs at this time. Will continue to monitor.

## 2018-01-26 NOTE — Plan of Care (Signed)
Pt rested comfortably overnight with no complaints of pain. Neo gtt titrated down overnight with MAPs greater than 65.

## 2018-01-26 NOTE — Progress Notes (Signed)
PHARMACIST - PHYSICIAN COMMUNICATION  CONCERNING: IV to Oral Route Change Policy  RECOMMENDATION: This patient is receiving famotidine by the intravenous route.  Based on criteria approved by the Pharmacy and Therapeutics Committee, the intravenous medication(s) is/are being converted to the equivalent oral dose form(s).   DESCRIPTION: These criteria include:  The patient is eating (either orally or via tube) and/or has been taking other orally administered medications for a least 24 hours  The patient has no evidence of active gastrointestinal bleeding or impaired GI absorption (gastrectomy, short bowel, patient on TNA or NPO).  If you have questions about this conversion, please contact the Pharmacy Department  []   [phone] )  [facility] [x]   [phone] )  [facility] []   [phone] )  [facility] []   [phone] )  [facility] []   [phone] )  [place], [facility] 01/26/2018 8:43 AM

## 2018-01-26 NOTE — Discharge Summary (Signed)
[facility]    Discharge Summary    Patient ID:  Shirley Alvarado MRN: [id number] DOB/AGE: Feb 06, 1951 67 y.o.  Admit date: 01/25/2018 Discharge date: 01/26/2018 Date of Surgery: 01/25/2018 Surgeon: Surgeon(s): Algernon Huxley, MD  Admission Diagnosis: Carotid stenosis, right [I65.21]  Discharge Diagnoses:  Carotid stenosis, right [I65.21]  Secondary Diagnoses: Past Medical History:  Diagnosis Date  . Anxiety   . Basal cell carcinoma   . Cancer (HCC)    COLON  . Chronic pain   . COPD (chronic obstructive pulmonary disease) ([place])   . Depression   . GERD (gastroesophageal reflux disease)   . Hypertension   . Ventral hernia     Procedure(s): CAROTID PTA/STENT INTERVENTION  Discharged Condition: good  HPI:  >90% right ICA stenosis, occluded left ICA with severe COPD  Hospital Course:  Shirley Alvarado is a 67 y.o. female is S/P Right Procedure(s): CAROTID PTA/STENT INTERVENTION Extubated: POD # 0 Physical exam: neuro exam intact Post-op wounds clean, dry, intact or healing well Pt. Ambulating, voiding and taking PO diet without difficulty. Pt pain controlled with PO pain meds. Labs as below Complications:none  Consults:    Significant Diagnostic Studies: CBC Lab Results  Component Value Date   WBC 4.6 01/26/2018   HGB 10.4 (L) 01/26/2018   HCT 30.6 (L) 01/26/2018   MCV 99.3 01/26/2018   PLT 235 01/26/2018    BMET    Component Value Date/Time   NA 135 01/26/2018 0657   NA 134 (L) 08/29/2013 0500   K 3.2 (L) 01/26/2018 0657   K 3.4 (L) 08/29/2013 0500   CL 98 (L) 01/26/2018 0657   CL 95 (L) 08/29/2013 0500   CO2 34 (H) 01/26/2018 0657   CO2 37 (H) 08/29/2013 0500   GLUCOSE 76 01/26/2018 0657   GLUCOSE 95 08/29/2013 0500   BUN <5 (L) 01/26/2018 0657   BUN 8 08/29/2013 0500   CREATININE 0.44 01/26/2018 0657   CREATININE 0.51 (L) 08/29/2013 0500   CALCIUM 8.0 (L) 01/26/2018 0657   CALCIUM 9.5 08/29/2013 0500   GFRNONAA  >60 01/26/2018 0657   GFRNONAA >60 08/29/2013 0500   GFRAA >60 01/26/2018 0657   GFRAA >60 08/29/2013 0500   COAG No results found for: INR, PROTIME   Disposition:  Discharge to :Home Discharge Instructions    Call MD for:  redness, tenderness, or signs of infection (pain, swelling, bleeding, redness, odor or green/yellow discharge around incision site)   Complete by:  As directed    Call MD for:  severe or increased pain, loss or decreased feeling  in affected limb(s)   Complete by:  As directed    Call MD for:  temperature >100.5   Complete by:  As directed    Driving Restrictions   Complete by:  As directed    No driving for 48 hours   No dressing needed   Complete by:  As directed    Replace only if drainage present   Resume previous diet   Complete by:  As directed      Allergies as of 01/26/2018      Reactions   Minocycline Anaphylaxis   Throat swelling   Levofloxacin Hives      Medication List    TAKE these medications   ADVAIR DISKUS 250-50 MCG/DOSE Aepb Generic drug:  Fluticasone-Salmeterol Inhale 1 puff into the lungs 2 (two) times daily.   amLODipine 10 MG tablet Commonly known as:  NORVASC Take 10 mg by  mouth daily.   ANORO ELLIPTA 62.5-25 MCG/INH Aepb Generic drug:  umeclidinium-vilanterol Inhale 1 puff into the lungs daily.   aspirin EC 81 MG tablet Take 81 mg by mouth daily.   atorvastatin 10 MG tablet Commonly known as:  LIPITOR Take 1 tablet (10 mg total) by mouth daily.   busPIRone 10 MG tablet Commonly known as:  BUSPAR Take 1 tablet (10 mg total) by mouth 3 (three) times daily. What changed:  how much to take   clopidogrel 75 MG tablet Commonly known as:  PLAVIX Take 2 tablets (150 mg total) by mouth once for 1 dose.   diazepam 5 MG tablet Commonly known as:  VALIUM 1/2 pill BID   fentaNYL 25 MCG/HR patch Commonly known as:  DURAGESIC - dosed mcg/hr Place 25 mcg onto the skin every 3 (three) days.   liothyronine 50 MCG  tablet Commonly known as:  CYTOMEL Take 50 mcg by mouth daily.   loratadine 10 MG tablet Commonly known as:  CLARITIN Take 10 mg by mouth daily.   losartan-hydrochlorothiazide 100-25 MG tablet Commonly known as:  HYZAAR TAKE 1 TABLET BY MOUTH DAILY.   meloxicam 15 MG tablet Commonly known as:  MOBIC Take 15 mg by mouth daily.   mometasone 50 MCG/ACT nasal spray Commonly known as:  NASONEX Place 1 spray into the nose daily.   montelukast 10 MG tablet Commonly known as:  SINGULAIR TAKE 1 TABLET BY MOUTH DAILY   oxyCODONE 15 MG immediate release tablet Commonly known as:  ROXICODONE Take 15 mg by mouth every 6 (six) hours as needed for pain.   PROAIR HFA 108 (90 Base) MCG/ACT inhaler Generic drug:  albuterol Inhale 2 puffs into the lungs every 6 (six) hours as needed.   QUEtiapine 100 MG tablet Commonly known as:  SEROQUEL Take 1 tablet (100 mg total) by mouth at bedtime.   ranitidine 150 MG capsule Commonly known as:  ZANTAC Take 150 mg by mouth 2 (two) times daily.   sertraline 100 MG tablet Commonly known as:  ZOLOFT Take 1 tablet (100 mg total) by mouth daily.   SPIRIVA HANDIHALER 18 MCG inhalation capsule Generic drug:  tiotropium Place 18 mcg into inhaler and inhale daily.   sulfamethoxazole-trimethoprim 800-160 MG tablet Commonly known as:  BACTRIM DS,SEPTRA DS Take 2 tablets by mouth 2 (two) times daily.   theophylline 400 MG 24 hr tablet Commonly known as:  UNIPHYL Take 400 mg by mouth daily.      Verbal and written Discharge instructions given to the patient. Wound care per Discharge AVS Follow-up Information    Stegmayer, Joelene Millin A, PA-C Follow up in 4 week(s).   Specialty:  Physician Assistant Why:  with carotid duplex Contact information: [place] [address] [phone]           Signed: Leotis Pain, MD  01/26/2018, 3:40 PM

## 2018-01-26 NOTE — Progress Notes (Signed)
RN called Dr. Lucky Cowboy and made MD aware that when patient ambulated to commode in room that she was short of breath, red in the face and very unsteady gate with oxygen dropping to 87% on 4L nasal cannula.  Dr. Lucky Cowboy gave order to hold discharge for today and to transfer patient to any med surge floor and to order PT consult.  RN also asked MD about giving blood pressure medications and Dr. Lucky Cowboy stated to hold them and they can be started back tomorrow morning.

## 2018-01-26 NOTE — Progress Notes (Signed)
Report called to [place], [facility] on 1A.  Patient has been A&Ox4 during shift but does mumble and talk to self when staff is not in room.  No neuro deficits noted.  Right groin site free of complications.

## 2018-01-26 NOTE — Progress Notes (Signed)
Patient moved to room 153 by wheelchair with Velna Hatchet, [place].  Patient alert with no distress noted on o2 when leaving ICU.

## 2018-01-27 ENCOUNTER — Encounter: Payer: Self-pay | Admitting: Vascular Surgery

## 2018-01-27 LAB — GLUCOSE, CAPILLARY
Glucose-Capillary: 92 mg/dL (ref 65–99)
Glucose-Capillary: 77 mg/dL (ref 65–99)

## 2018-01-27 MED ORDER — CLOPIDOGREL BISULFATE 75 MG PO TABS: 75.0000 mg | ORAL_TABLET | Freq: Every day | ORAL | 11 refills | Status: DC

## 2018-01-27 MED ORDER — MORPHINE SULFATE (PF) 2 MG/ML IV SOLN
2.0000 mg | Freq: Once | INTRAVENOUS | Status: AC
  Administered 2018-01-27: 2 mg via INTRAVENOUS
  Filled 2018-01-27: qty 1

## 2018-01-27 MED ORDER — CLOPIDOGREL BISULFATE 75 MG PO TABS: 75.0000 mg | ORAL_TABLET | Freq: Every day | ORAL | 5 refills | Status: DC

## 2018-01-27 MED ORDER — ATORVASTATIN CALCIUM 10 MG PO TABS: 10.0000 mg | ORAL_TABLET | Freq: Every day | ORAL | 5 refills | Status: DC

## 2018-01-27 NOTE — Progress Notes (Signed)
Pt woke up hypoxic 02 sat 81-83% on 4 liters. Heart ratre was 130's-140's. Resp. Therapist was called for nebulizer treatment.Hospitalist was notified. Morphine 2 mgm was ordered and given for air hunger.  MD in to see pt.

## 2018-01-27 NOTE — Care Management Note (Signed)
Case Management Note  Patient Details  Name: Shirley Alvarado MRN: [id number] Date of Birth: Oct 05, 1951  Subjective/Objective:   Met with patient at bedside to discuss discharge plan. Patient lives at home with her spouse. She will need a walker with seat . Ordered from [place] with [facility]. PT recommending HHPT. She is agreeable. Offered home health agencies. Referral to [facility] for HHPT. She is on $l chronic O2 through [place]. She has a portable tank for transport home. PCP is Dr. Ardelle Anton                Action/Plan: [facility] for HHPT and 4 wheeled walker Expected Discharge Date:  01/27/18               Expected Discharge Plan:  [place]  In-House Referral:     Discharge planning Services  CM Consult  Post Acute Care Choice:  Durable Medical Equipment, Home Health Choice offered to:  Patient  DME Arranged:  Walker rolling with seat DME Agency:  [place]:  PT [facility] Agency:  [place]  Status of Service:  Completed, signed off  If discussed at [place] of Stay Meetings, dates discussed:    Additional Comments:  Jolly Mango, RN 01/27/2018, 2:45 PM

## 2018-01-27 NOTE — Evaluation (Signed)
Physical Therapy Evaluation Patient Details Name: Shirley Alvarado MRN: [id number] DOB: 03-Feb-1951 Today's Date: 01/27/2018   History of Present Illness  admitted for acute hospitalization status post R carotid stending (via L femoral artery), 4/22.  Post-op course complicated by respiratory difficulty, requiring HFNC; now weaned to 4L supplemental O2 via   Clinical Impression  Upon evaluation, patient alert and oriented; follows commands and demonstrates good effort with mobility.  Bilat UE/LE strength and ROM grossly symmetrical and WFL.  Able to complete bed mobility indep; sit/stand, basic transfers and gait (130') with RW, cga/close sup.  Slow, but steady, cadence without buckling or LOB.  Mild fatigue after distance; sats 89% on 4L supplemental O2.  Did introduce/review energy conservation and activity pacing strategies; patient voiced understanding, will continue to reinforce. Would benefit from skilled PT to address above deficits and promote optimal return to PLOF; Recommend transition to [place] upon discharge from acute hospitalization.     Follow Up Recommendations Home health PT    Equipment Recommendations  (recommend RW; patient requesting [phone])    Recommendations for Other Services       Precautions / Restrictions Precautions Precautions: Fall Restrictions Weight Bearing Restrictions: No      Mobility  Bed Mobility Overal bed mobility: Modified Independent Bed Mobility: Supine to Sit              Transfers Overall transfer level: Needs assistance Equipment used: Rolling walker (2 wheeled) Transfers: Sit to/from Stand Sit to Stand: Min guard            Ambulation/Gait Ambulation/Gait assistance: Min guard Ambulation Distance (Feet): 130 Feet Assistive device: Rolling walker (2 wheeled)   Gait velocity: 10' walk time, 10 seconds   General Gait Details: reciprocal stepping pattern with slow, but steady, cadence  Stairs             Wheelchair Mobility    Modified Rankin (Stroke Patients Only)       Balance Overall balance assessment: Needs assistance Sitting-balance support: No upper extremity supported;Feet supported Sitting balance-Leahy Scale: Good     Standing balance support: Bilateral upper extremity supported Standing balance-Leahy Scale: Fair                               Pertinent Vitals/Pain Pain Assessment: No/denies pain    Home Living Family/patient expects to be discharged to:: Private residence Living Arrangements: (roomate)   Type of Home: House Home Access: Ramped entrance     Home Layout: One level Home Equipment: None      Prior Function Level of Independence: Independent         Comments: Indep without assist device for ADL, household distances (limited by SOB); home O2 at 2.5L.  Denies fall history.     Hand Dominance        Extremity/Trunk Assessment   Upper Extremity Assessment Upper Extremity Assessment: Overall WFL for tasks assessed    Lower Extremity Assessment Lower Extremity Assessment: Overall WFL for tasks assessed(grossly at least 4/5 throughout)       Communication   Communication: No difficulties  Cognition Arousal/Alertness: Awake/alert Behavior During Therapy: WFL for tasks assessed/performed Overall Cognitive Status: Within Functional Limits for tasks assessed                                        General Comments  Exercises     Assessment/Plan    PT Assessment Patient needs continued PT services  PT Problem List Decreased activity tolerance;Decreased balance;Cardiopulmonary status limiting activity       PT Treatment Interventions DME instruction;Gait training;Functional mobility training;Therapeutic activities;Therapeutic exercise;Balance training;Patient/family education    PT Goals (Current goals can be found in the Care Plan section)  Acute Rehab PT Goals Patient Stated Goal: to go  back and watch my 'soaps' PT Goal Formulation: With patient Time For Goal Achievement: 02/10/18 Potential to Achieve Goals: Good    Frequency Min 2X/week   Barriers to discharge Decreased caregiver support      Co-evaluation               AM-PAC PT "6 Clicks" Daily Activity  Outcome Measure Difficulty turning over in bed (including adjusting bedclothes, sheets and blankets)?: None Difficulty moving from lying on back to sitting on the side of the bed? : None Difficulty sitting down on and standing up from a chair with arms (e.g., wheelchair, bedside commode, etc,.)?: A Little Help needed moving to and from a bed to chair (including a wheelchair)?: A Little Help needed walking in hospital room?: A Little Help needed climbing 3-5 steps with a railing? : A Little 6 Click Score: 20    End of Session Equipment Utilized During Treatment: Gait belt;Oxygen Activity Tolerance: Patient tolerated treatment well;Patient limited by fatigue Patient left: in bed;with call bell/phone within reach;with bed alarm set Nurse Communication: Mobility status PT Visit Diagnosis: Unsteadiness on feet (R26.81);Difficulty in walking, not elsewhere classified (R26.2);Muscle weakness (generalized) (M62.81)    Time: 1355-1411 PT Time Calculation (min) (ACUTE ONLY): 16 min   Charges:   PT Evaluation $PT Eval Moderate Complexity: 1 Mod     PT G Codes:        [place] H. Owens Shark, PT, DPT, NCS 01/27/18, 9:19 PM [phone]

## 2018-01-27 NOTE — Progress Notes (Signed)
[facility]  Daily Progress Note   Subjective  - 2 Days Post-Op  Discharge held yesterday secondary to weakness.  Had a panic attack overnight and was put on HFNC.  Now back on regular [place] and sats 100%.  Sitting up.  Very anxious but wants to go home  Objective Vitals:   01/27/18 0504 01/27/18 0536 01/27/18 0800 01/27/18 0803  BP: (!) 150/68  (!) 122/53   Pulse: (!) 111  84   Resp:   18   Temp:   99 F (37.2 C)   TempSrc:   Oral   SpO2: 94% 97% 100% 100%  Weight:      Height:        Intake/Output Summary (Last 24 hours) at 01/27/2018 1233 Last data filed at 01/27/2018 0934 Gross per 24 hour  Intake 480 ml  Output 1000 ml  Net -520 ml    PULM  CTAB CV  RRR VASC  Access site C/D/I. Feet warm.  Neuro exam intact  Laboratory CBC    Component Value Date/Time   WBC 4.6 01/26/2018 0657   HGB 10.4 (L) 01/26/2018 0657   HGB 14.4 08/29/2013 0500   HCT 30.6 (L) 01/26/2018 0657   HCT 43.3 08/29/2013 0500   PLT 235 01/26/2018 0657   PLT 310 08/29/2013 0500    BMET    Component Value Date/Time   NA 135 01/26/2018 0657   NA 134 (L) 08/29/2013 0500   K 3.2 (L) 01/26/2018 0657   K 3.4 (L) 08/29/2013 0500   CL 98 (L) 01/26/2018 0657   CL 95 (L) 08/29/2013 0500   CO2 34 (H) 01/26/2018 0657   CO2 37 (H) 08/29/2013 0500   GLUCOSE 76 01/26/2018 0657   GLUCOSE 95 08/29/2013 0500   BUN <5 (L) 01/26/2018 0657   BUN 8 08/29/2013 0500   CREATININE 0.44 01/26/2018 0657   CREATININE 0.51 (L) 08/29/2013 0500   CALCIUM 8.0 (L) 01/26/2018 0657   CALCIUM 9.5 08/29/2013 0500   GFRNONAA >60 01/26/2018 0657   GFRNONAA >60 08/29/2013 0500   GFRAA >60 01/26/2018 0657   GFRAA >60 08/29/2013 0500    Assessment/Planning: POD #2 s/p right carotid stent   Ambulate today  Plan discharge this afternoon unless other issues arise.      Leotis Pain  01/27/2018, 12:33 PM

## 2018-01-27 NOTE — Progress Notes (Signed)
PT Cancellation Note  Patient Details Name: Shirley Alvarado MRN: [id number] DOB: 1951/04/02   Cancelled Treatment:    Reason Eval/Treat Not Completed: Medical issues which prohibited therapy(Consult received and chart reviewed.  Patient currently on HFNC for SOB, desaturation.  Per primary RN, planning to wean to standard [place] this date as able. Will hold evaluation until PM with hopes of being able to do more comprehensive mobility assessment (not limited by HFNC). If HFNC remains, will proceed with evaluation in PM regardless.)   Saara Kijowski H. Owens Shark, PT, DPT, NCS 01/27/18, 11:11 AM [phone]

## 2018-01-27 NOTE — Progress Notes (Signed)
Pt placed on High Flow 02 @ 45 liters, 55%

## 2018-01-27 NOTE — Progress Notes (Signed)
Decreased fio2 to 40%

## 2018-02-01 ENCOUNTER — Telehealth: Payer: Self-pay

## 2018-02-01 NOTE — Telephone Encounter (Signed)
Flagged on EMMI report for not reading discharge papers and having unfilled RXs.  Called and spoke with patient, who mentioned she has had a chance to read over paperwork and has since filled her medications.  She did relay that she ran into a situation where she ran out of oxygen before even leaving the hospital parking lot once she discharged, saying that situation was "unacceptable".  I apologized for the patient having that experience.  She said it was okay and that she "wasn't dead".  Other than that, she has no further questions or concerns at this time.   I thanked her for her time and informed her that she would receive one more automated call within the next few days as a final check out. She mentioned she would like to opt out of receiving the final EMMI call.  I told her I would pass her request to the [facility] team to have her removed from the list.

## 2018-02-09 ENCOUNTER — Other Ambulatory Visit: Payer: Self-pay

## 2018-02-09 ENCOUNTER — Emergency Department
Admission: EM | Admit: 2018-02-09 | Discharge: 2018-02-09 | Disposition: A | Discharge status: Home / Self Care | Payer: Medicare Other | Attending: Student in an Organized Health Care Education/Training Program | Admitting: Student in an Organized Health Care Education/Training Program

## 2018-02-09 ENCOUNTER — Encounter: Payer: Self-pay | Admitting: Emergency Medicine

## 2018-02-09 ENCOUNTER — Telehealth (INDEPENDENT_AMBULATORY_CARE_PROVIDER_SITE_OTHER): Payer: Self-pay

## 2018-02-09 ENCOUNTER — Emergency Department: Payer: Medicare Other

## 2018-02-09 DIAGNOSIS — S0990XA Unspecified injury of head, initial encounter: Secondary | ICD-10-CM | POA: Diagnosis present

## 2018-02-09 DIAGNOSIS — S0093XA Contusion of unspecified part of head, initial encounter: Secondary | ICD-10-CM | POA: Diagnosis not present

## 2018-02-09 DIAGNOSIS — J449 Chronic obstructive pulmonary disease, unspecified: Secondary | ICD-10-CM | POA: Insufficient documentation

## 2018-02-09 DIAGNOSIS — F17210 Nicotine dependence, cigarettes, uncomplicated: Secondary | ICD-10-CM | POA: Diagnosis not present

## 2018-02-09 DIAGNOSIS — I10 Essential (primary) hypertension: Secondary | ICD-10-CM | POA: Insufficient documentation

## 2018-02-09 DIAGNOSIS — Z7982 Long term (current) use of aspirin: Secondary | ICD-10-CM | POA: Diagnosis not present

## 2018-02-09 DIAGNOSIS — Y999 Unspecified external cause status: Secondary | ICD-10-CM | POA: Diagnosis not present

## 2018-02-09 DIAGNOSIS — Y939 Activity, unspecified: Secondary | ICD-10-CM | POA: Insufficient documentation

## 2018-02-09 DIAGNOSIS — Z79899 Other long term (current) drug therapy: Secondary | ICD-10-CM | POA: Diagnosis not present

## 2018-02-09 DIAGNOSIS — Y929 Unspecified place or not applicable: Secondary | ICD-10-CM | POA: Insufficient documentation

## 2018-02-09 DIAGNOSIS — X58XXXA Exposure to other specified factors, initial encounter: Secondary | ICD-10-CM | POA: Diagnosis not present

## 2018-02-09 MED ORDER — KETOROLAC TROMETHAMINE 30 MG/ML IJ SOLN
30.0000 mg | Freq: Once | INTRAMUSCULAR | Status: AC
  Administered 2018-02-09: 30 mg via INTRAMUSCULAR
  Filled 2018-02-09: qty 1

## 2018-02-09 NOTE — Telephone Encounter (Signed)
Patient called stating she hit her head while reaching for her medication and is having a headache and panic attacks.The patient had a right carotid stent on 01/25/18.I spoke with KS and she advise for the patient to be evaluated at the ED because of the recent procedure and also being on a blood thinner

## 2018-02-09 NOTE — ED Triage Notes (Signed)
Says had recent surgery to clean out arteries going to brain and is on blood thinners.

## 2018-02-09 NOTE — ED Provider Notes (Signed)
[facility] Emergency Department Provider Note   ____________________________________________   First MD Initiated Contact with Patient 02/09/18 1501     (approximate)  I have reviewed the triage vital signs and the nursing notes.   HISTORY  Chief Complaint Headache    HPI Shirley Alvarado is a 67 y.o. female patient complain of headache secondary to a contusion to the top of her skull 2 days ago.  Patient contacted her doctor and was told to come to the emergency room since she is currently on blood thinners and had a cardiac stent last month.  Patient denies vision disturbance, weakness, or numbness.  Patient is on the pain management clinic for chronic pain.   . Patient states the pain is "killing me".  No palliative measure for complaint.  Patient is currently on a fentanyl patch.  Past Medical History:  Diagnosis Date  . Anxiety   . Basal cell carcinoma   . Cancer (HCC)    COLON  . Chronic pain   . COPD (chronic obstructive pulmonary disease) ([place])   . Depression   . GERD (gastroesophageal reflux disease)   . Hypertension   . Ventral hernia     Patient Active Problem List   Diagnosis Date Noted  . Carotid stenosis, right 01/25/2018  . Carotid stenosis 01/08/2018  . LBP (low back pain) 12/24/2015  . Infected hernioplasty mesh ([place])   . Pulmonary emphysema ([place])   . Other long term (current) drug therapy 07/06/2015  . Long term current use of opiate analgesic 07/06/2015  . 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 Foot pain 10/12/2014  . Plantar verruca 10/12/2014  . Continuous opioid dependence ([place]) 06/09/2014  . Advance directive discussed with patient 04/11/2013  . 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 Adult BMI 30+ 11/23/2012  . Lung mass 11/23/2012  . Compulsive tobacco user syndrome 11/23/2012  . Current tobacco use  11/23/2012  . Chronic pain 04/27/2012  . Cancer of colon ([place]) 07/29/2011  . CAFL (chronic airflow limitation) ([place]) 07/29/2011  . Clinical depression 07/29/2011  . BP (high blood pressure) 07/29/2011  . Hernia of anterior abdominal wall 07/29/2011  . Malignant neoplasm of colon ([place]) 07/29/2011  . Chronic obstructive pulmonary disease ([place]) 07/29/2011    Past Surgical History:  Procedure Laterality Date  . CAROTID PTA/STENT INTERVENTION Right 01/25/2018   Procedure: CAROTID PTA/STENT INTERVENTION;  Surgeon: Algernon Huxley, MD;  Location: [facility];  Service: Cardiovascular;  Laterality: Right;  . CATARACT EXTRACTION W/PHACO Left 08/04/2016   Procedure: CATARACT EXTRACTION PHACO AND INTRAOCULAR LENS PLACEMENT (IOC);  Surgeon: Ronnell Freshwater, MD;  Location: [facility];  Service: Ophthalmology;  Laterality: Left;  LEFT  . COLON SURGERY    . TUBAL LIGATION      Prior to Admission medications   Medication Sig Start Date End Date Taking? Authorizing Provider  albuterol (PROAIR HFA) 108 (90 BASE) MCG/ACT inhaler Inhale 2 puffs into the lungs every 6 (six) hours as needed.  03/23/15   [provider]  amLODipine (NORVASC) 10 MG tablet Take 10 mg by mouth daily.  09/11/14   [provider]  aspirin EC 81 MG tablet Take 81 mg by mouth daily.    [provider]  atorvastatin (LIPITOR) 10 MG tablet Take 1 tablet (10 mg total) by mouth daily. 01/27/18 01/27/19  Algernon Huxley, MD  busPIRone (BUSPAR)  10 MG tablet Take 1 tablet (10 mg total) by mouth 3 (three) times daily. Patient taking differently: Take 5 mg by mouth 3 (three) times daily.  12/07/17   Rainey Pines, MD  clopidogrel (PLAVIX) 75 MG tablet Take 1 tablet (75 mg total) by mouth daily. 01/27/18   Algernon Huxley, MD  diazepam (VALIUM) 5 MG tablet 1/2 pill BID 12/07/17   Rainey Pines, MD  fentaNYL (DURAGESIC - DOSED MCG/HR) 25 MCG/HR patch Place 25 mcg onto the skin every 3 (three) days.     [provider]  Fluticasone-Salmeterol (ADVAIR DISKUS) 250-50 MCG/DOSE AEPB Inhale 1 puff into the lungs 2 (two) times daily.  03/23/15   [provider]  liothyronine (CYTOMEL) 50 MCG tablet Take 50 mcg by mouth daily.  01/30/15 01/25/18  [provider]  loratadine (CLARITIN) 10 MG tablet Take 10 mg by mouth daily.  01/30/15   [provider]  losartan-hydrochlorothiazide (HYZAAR) 100-25 MG per tablet TAKE 1 TABLET BY MOUTH DAILY. 12/11/14   [provider]  meloxicam (MOBIC) 15 MG tablet Take 15 mg by mouth daily. 03/11/17   [provider]  mometasone (NASONEX) 50 MCG/ACT nasal spray Place 1 spray into the nose daily. 03/01/17   [provider]  montelukast (SINGULAIR) 10 MG tablet TAKE 1 TABLET BY MOUTH DAILY 04/12/15   [provider]  oxyCODONE (ROXICODONE) 15 MG immediate release tablet Take 15 mg by mouth every 6 (six) hours as needed for pain.  03/15/17   [provider]  QUEtiapine (SEROQUEL) 100 MG tablet Take 1 tablet (100 mg total) by mouth at bedtime. 12/07/17   Rainey Pines, MD  ranitidine (ZANTAC) 150 MG capsule Take 150 mg by mouth 2 (two) times daily.  03/26/15   [provider]  sertraline (ZOLOFT) 100 MG tablet Take 1 tablet (100 mg total) by mouth daily. 12/07/17   Rainey Pines, MD  sulfamethoxazole-trimethoprim (BACTRIM DS,SEPTRA DS) 800-160 MG tablet Take 2 tablets by mouth 2 (two) times daily. Patient not taking: Reported on 01/22/2018 03/24/17   Orbie Pyo, MD  theophylline (UNIPHYL) 400 MG 24 hr tablet Take 400 mg by mouth daily. 02/12/17   [provider]  tiotropium (SPIRIVA HANDIHALER) 18 MCG inhalation capsule Place 18 mcg into inhaler and inhale daily.  03/23/15   [provider]  umeclidinium-vilanterol (ANORO ELLIPTA) 62.5-25 MCG/INH AEPB Inhale 1 puff into the lungs daily.    [provider]    Allergies Minocycline and Levofloxacin  Family History    Problem Relation Age of Onset  . Lung cancer Mother   . Hypertension Mother   . Diabetes Mother   . Anxiety disorder Mother   . Depression Mother   . Breast cancer Mother   . Hypertension Father   . Diabetes Father   . Heart attack Father   . Depression Father   . Anxiety disorder Father   . Alcohol abuse Father   . Drug abuse Father   . Cancer Sister   . Diabetes Sister   . Hypertension Sister   . Obesity Sister   . Hypertension Brother   . Hypertension Brother   . Liver disease Brother   . Colon cancer Brother   . Breast cancer Maternal Aunt   . Breast cancer Maternal Aunt   . Breast cancer Maternal Aunt     Social History Social History   Tobacco Use  . Smoking status: Current Every Day Smoker    Packs/day: 0.50  Years: 50.00    Pack years: 25.00    Types: Cigarettes    Start date: 05/21/1970  . Smokeless tobacco: Never Used  . Tobacco comment: However patient relates that she smoked for 50 years and at one point she was up to 2 packs per day  Substance Use Topics  . Alcohol use: No    Alcohol/week: 0.0 oz  . Drug use: No    Review of Systems Constitutional: No fever/chills Eyes: No visual changes. ENT: No sore throat. Cardiovascular: Denies chest pain. Respiratory: Denies shortness of breath. Gastrointestinal: No abdominal pain.  No nausea, no vomiting.  No diarrhea.  No constipation. Genitourinary: Negative for dysuria. Musculoskeletal: Negative for back pain. Skin: Negative for rash. Neurological: Positive for headaches, but denies focal weakness or numbness. Psychiatric:Anxiety depression Endocrine:Hypertension Allergic/Immunilogical: See medication list.  ____________________________________________   PHYSICAL EXAM:  VITAL SIGNS: ED Triage Vitals  Enc Vitals Group     BP 02/09/18 1509 (!) 120/50     Pulse Rate 02/09/18 1509 80     Resp 02/09/18 1509 18     Temp --      Temp src --      SpO2 02/09/18 1509 90 %     Weight 02/09/18  1251 166 lb (75.3 kg)     Height 02/09/18 1251 5\' 4"  (1.626 m)     Head Circumference --      Peak Flow --      Pain Score 02/09/18 1251 1     Pain Loc --      Pain Edu? --      Excl. in [place]? --    Constitutional: Alert and oriented. Well appearing and in no acute distress. Eyes: Conjunctivae are normal. PERRL. EOMI. Head: Atraumatic. Nose: No congestion/rhinnorhea.  No guarding with palpation of the frontal and maxillary sinuses.   Neck: No stridor.  No cervical spine tenderness to palpation. Hematological/Lymphatic/Immunilogical: No cervical lymphadenopathy. Cardiovascular: Normal rate, regular rhythm. Grossly normal heart sounds.  Good peripheral circulation. Respiratory: Patient is using supplementary oxygen.  Normal respiratory effort.  No retractions. Lungs CTAB. Gastrointestinal: Soft and nontender. No distention. No abdominal bruits. No CVA tenderness. Musculoskeletal: No lower extremity tenderness nor edema.  No joint effusions. Neurologic:  Normal speech and language. No gross focal neurologic deficits are appreciated. No gait instability. Skin:  Skin is warm, dry and intact. No rash noted. Psychiatric: Mood and affect are normal. Speech and behavior are normal.  ____________________________________________   LABS (all labs ordered are listed, but only abnormal results are displayed)  Labs Reviewed - No data to display ____________________________________________  EKG   ____________________________________________  RADIOLOGY   Official radiology report(s): Ct Head Wo Contrast  Result Date: 02/09/2018 CLINICAL DATA:  Severe headache. Minor head trauma 2 days ago. Currently on anti coagulation therapy. EXAM: CT HEAD WITHOUT CONTRAST TECHNIQUE: Contiguous axial images were obtained from the base of the skull through the vertex without intravenous contrast. COMPARISON:  None. FINDINGS: Brain: No mass lesion, intraparenchymal hemorrhage or extra-axial collection. No  evidence of acute cortical infarct. Normal appearance of the brain parenchyma and extra axial spaces for age. Vascular: Atherosclerotic calcification of the vertebral and internal carotid arteries at the skull base. No hyperdense vessel. Skull: Normal visualized skull base, calvarium and extracranial soft tissues. Sinuses/Orbits: No sinus fluid levels or advanced mucosal thickening. No mastoid effusion. Normal orbits. IMPRESSION: Normal aging brain.  Calcific intracranial atherosclerosis. Electronically Signed   By: Ulyses Jarred M.D.   On: 02/09/2018 13:52  ____________________________________________   PROCEDURES  Procedure(s) performed:   Procedures  Critical Care performed: No  ____________________________________________   INITIAL IMPRESSION / ASSESSMENT AND PLAN / ED COURSE  As part of my medical decision making, I reviewed the following data within the electronic MEDICAL RECORD NUMBER    Headache secondary to contusion.  Discussed negative CT findings of the head with patient.  Patient given discharge care instruction and advised take extra strength Tylenol along for chronic pain medications.  Follow-up with PCP.      ____________________________________________   FINAL CLINICAL IMPRESSION(S) / ED DIAGNOSES  Final diagnoses:  Contusion of head, initial encounter     ED Discharge Orders    None       Note:  This document was prepared using Dragon voice recognition software and may include unintentional dictation errors.    Sable Feil, PA-C 02/09/18 1530    Merlyn Lot, MD 02/09/18 [phone]

## 2018-02-09 NOTE — Discharge Instructions (Addendum)
headache seems to be caused by your contusion 2 days ago.  Advised to continue previous medication use extra strength Tylenol for headache.

## 2018-02-09 NOTE — ED Triage Notes (Signed)
Says she was told to come in by dr dew.  Has worsening headache since she hit her head Sunday.

## 2018-02-12 ENCOUNTER — Telehealth (HOSPITAL_COMMUNITY): Payer: Self-pay

## 2018-02-12 NOTE — Telephone Encounter (Signed)
Patient called requesting an increase on her Diazepam 5mg  tabs. She said that she was recently in the hospital and she was having panic attacks often. Please review and advise

## 2018-02-18 ENCOUNTER — Telehealth: Payer: Self-pay

## 2018-02-18 NOTE — Telephone Encounter (Signed)
D/c Buspar and follow up with PCP regarding Nausea as she has contusion of head and might be having some medical issues. No change in Diazepam dose .

## 2018-02-18 NOTE — Telephone Encounter (Signed)
I called pt back to let her know that Dr. Gretel Acre had already left for the day and per the front desk lea she will not be back in the office for a month.  Pt is very upset states she needs help. She doesn't want to die and she having all this anxiety. Pt wanted to know if another doctor would take over her care that is in the office more.  Pt was told that I would send a message to the full time doctor Dr. Shea Evans and see if she would consider takin you as a patient.

## 2018-02-18 NOTE — Telephone Encounter (Signed)
pt called again wants to know what to due.

## 2018-02-18 NOTE — Telephone Encounter (Signed)
Please advise 

## 2018-02-18 NOTE — Telephone Encounter (Signed)
pt left a message on voice mail that she can not take the buspirone it is making her nauseas and that she needs something else.  also would like to have something for the nausea.

## 2018-02-18 NOTE — Telephone Encounter (Signed)
Per pt she stated that while in the hospital she was having panic attack and she states that she had a blockage in her neck 98% and another one at 100% .  She states that dr. Wandra Feinstein told her that it was her anxiety and that she call her psychiatric.

## 2018-02-18 NOTE — Telephone Encounter (Signed)
Follow up with PCP

## 2018-02-18 NOTE — Telephone Encounter (Signed)
I could see her until Dr.Faheem returns to office in a month. Please set up appointment for 30 minutes at next availability. If she is in a crisis pls ask her to go to nearest ED. THANKS

## 2018-02-22 ENCOUNTER — Ambulatory Visit: Payer: Medicare Other | Admitting: Psychiatry

## 2018-02-23 ENCOUNTER — Other Ambulatory Visit (INDEPENDENT_AMBULATORY_CARE_PROVIDER_SITE_OTHER): Payer: Self-pay | Admitting: Vascular Surgery

## 2018-02-23 ENCOUNTER — Other Ambulatory Visit: Payer: Self-pay | Admitting: Psychiatry

## 2018-02-23 DIAGNOSIS — I6523 Occlusion and stenosis of bilateral carotid arteries: Secondary | ICD-10-CM

## 2018-02-24 ENCOUNTER — Ambulatory Visit (INDEPENDENT_AMBULATORY_CARE_PROVIDER_SITE_OTHER): Payer: Medicare Other

## 2018-02-24 ENCOUNTER — Encounter (INDEPENDENT_AMBULATORY_CARE_PROVIDER_SITE_OTHER): Payer: Self-pay | Admitting: Vascular Surgery

## 2018-02-24 ENCOUNTER — Ambulatory Visit (INDEPENDENT_AMBULATORY_CARE_PROVIDER_SITE_OTHER): Payer: Medicare Other | Admitting: Vascular Surgery

## 2018-02-24 VITALS — BP 101/53 | HR 94 | Resp 16 | Ht 64.0 in | Wt 151.4 lb

## 2018-02-24 DIAGNOSIS — Z720 Tobacco use: Secondary | ICD-10-CM

## 2018-02-24 DIAGNOSIS — I6523 Occlusion and stenosis of bilateral carotid arteries: Secondary | ICD-10-CM

## 2018-02-24 DIAGNOSIS — I10 Essential (primary) hypertension: Secondary | ICD-10-CM

## 2018-02-24 NOTE — Progress Notes (Signed)
Subjective:    Patient ID: Shirley Alvarado, female    DOB: [birth year], 67 y.o.   MRN: [id number] Chief Complaint  Patient presents with  . Follow-up    [facility] 4wk carotid   She presents for her first post procedure follow-up.  The patient is status post a right common carotid artery ICA stent placement on January 25, 2018.  The patient is seen with her brother.  The patient presents today without complaint. The patient underwent a bilateral carotid duplex scan which showed Right ICA stenosis (40-59%) and known left ICA stenosis.  Bilateral vertebral arteries demonstrate antegrade flow.  Left subclavian artery was stenotic.  Normal flow dynamics were seen in the right subclavian artery.  The patient denies experiencing Amaurosis Fugax, TIA like symptoms or focal motor deficits.  The patient denies any fever, nausea vomiting.  Patient denies any issues with her bilateral groin incisions.  There are healing well.  Review of Systems  Constitutional: Negative.   HENT: Negative.   Eyes: Negative.   Respiratory: Negative.   Cardiovascular:       Carotid stenosis  Gastrointestinal: Negative.   Endocrine: Negative.   Genitourinary: Negative.   Musculoskeletal: Negative.   Skin: Negative.   Allergic/Immunologic: Negative.   Neurological: Negative.   Hematological: Negative.   Psychiatric/Behavioral: Negative.       Objective:   Physical Exam  Constitutional: She is oriented to person, place, and time. She appears well-developed and well-nourished. No distress.  HENT:  Head: Normocephalic and atraumatic.  Right Ear: External ear normal.  Left Ear: External ear normal.  Eyes: Pupils are equal, round, and reactive to light. Conjunctivae and EOM are normal.  Neck: Normal range of motion.  Right carotid bruit noted on exam  Cardiovascular: Normal rate, regular rhythm and normal heart sounds.  Pulses:      Radial pulses are 2+ on the right side, and 2+ on the left side.  Pulmonary/Chest:  Effort normal and breath sounds normal.  Musculoskeletal: Normal range of motion. She exhibits no edema.  Neurological: She is alert and oriented to person, place, and time.  Skin: Skin is warm and dry. She is not diaphoretic.  Psychiatric: She has a normal mood and affect. Her behavior is normal. Judgment and thought content normal.  Vitals reviewed.  BP (!) 101/53   Pulse 94   Resp 16   Ht 5\' 4"  (1.626 m)   Wt 151 lb 6.4 oz (68.7 kg)   BMI 25.99 kg/m   Past Medical History:  Diagnosis Date  . Anxiety   . Basal cell carcinoma   . Cancer (HCC)    COLON  . Chronic pain   . COPD (chronic obstructive pulmonary disease) ([place])   . Depression   . GERD (gastroesophageal reflux disease)   . Hypertension   . Ventral hernia    Social History   Socioeconomic History  . Marital status: Widowed    Spouse name: Not on file  . Number of children: 2  . Years of education: Not on file  . Highest education level: 11th grade  Occupational History    Comment: retired  Scientific laboratory technician  . Financial resource strain: Very hard  . Food insecurity:    Worry: Sometimes true    Inability: Sometimes true  . Transportation needs:    Medical: No    Non-medical: No  Tobacco Use  . Smoking status: Current Every Day Smoker    Packs/day: 0.50    Years: 50.00  Pack years: 25.00    Types: Cigarettes    Start date: 05/21/1970  . Smokeless tobacco: Never Used  . Tobacco comment: However patient relates that she smoked for 50 years and at one point she was up to 2 packs per day  Substance and Sexual Activity  . Alcohol use: No    Alcohol/week: 0.0 oz  . Drug use: No  . Sexual activity: Not Currently  Lifestyle  . Physical activity:    Days per week: 0 days    Minutes per session: 0 min  . Stress: Only a little  Relationships  . Social connections:    Talks on phone: More than three times a week    Gets together: Once a week    Attends religious service: More than 4 times per year     Active member of club or organization: Yes    Attends meetings of clubs or organizations: More than 4 times per year    Relationship status: Widowed  . Intimate partner violence:    Fear of current or ex partner: No    Emotionally abused: No    Physically abused: No    Forced sexual activity: No  Other Topics Concern  . Not on file  Social History Narrative  . Not on file   Past Surgical History:  Procedure Laterality Date  . CAROTID PTA/STENT INTERVENTION Right 01/25/2018   Procedure: CAROTID PTA/STENT INTERVENTION;  Surgeon: Algernon Huxley, MD;  Location: [facility] [facility];  Service: Cardiovascular;  Laterality: Right;  . CATARACT EXTRACTION W/PHACO Left 08/04/2016   Procedure: CATARACT EXTRACTION PHACO AND INTRAOCULAR LENS PLACEMENT (IOC);  Surgeon: Ronnell Freshwater, MD;  Location: [facility];  Service: Ophthalmology;  Laterality: Left;  LEFT  . COLON SURGERY    . TUBAL LIGATION     Family History  Problem Relation Age of Onset  . Lung cancer Mother   . Hypertension Mother   . Diabetes Mother   . Anxiety disorder Mother   . Depression Mother   . Breast cancer Mother   . Hypertension Father   . Diabetes Father   . Heart attack Father   . Depression Father   . Anxiety disorder Father   . Alcohol abuse Father   . Drug abuse Father   . Cancer Sister   . Diabetes Sister   . Hypertension Sister   . Obesity Sister   . Hypertension Brother   . Hypertension Brother   . Liver disease Brother   . Colon cancer Brother   . Breast cancer Maternal Aunt   . Breast cancer Maternal Aunt   . Breast cancer Maternal Aunt    Allergies  Allergen Reactions  . Minocycline Anaphylaxis    Throat swelling  . Levofloxacin Hives      Assessment & Plan:  She presents for her first post procedure follow-up.  The patient is status post a right common carotid artery ICA stent placement on January 25, 2018.  The patient is seen with her brother.  The patient presents today  without complaint. The patient underwent a bilateral carotid duplex scan which showed Right ICA stenosis (40-59%) and known left ICA stenosis.  Bilateral vertebral arteries demonstrate antegrade flow.  Left subclavian artery was stenotic.  Normal flow dynamics were seen in the right subclavian artery.  The patient denies experiencing Amaurosis Fugax, TIA like symptoms or focal motor deficits.  The patient denies any fever, nausea vomiting.  Patient denies any issues with her bilateral groin  incisions.  There are healing well.  1. Bilateral carotid artery stenosis - Stable Patient with 40 to 59% stenosis status post stenting. Patient to continue taking aspirin and Plavix She is on Lipitor Patient presents asymptomatically. Physical exam is unremarkable. To follow-up in 3 months for repeat carotid duplex I have discussed with the patient at length the risk factors for and pathogenesis of atherosclerotic disease and encouraged a healthy diet, regular exercise regimen and blood pressure / glucose control.  Patient was instructed to contact our office in the interim with problems such as arm / leg weakness or numbness, speech / swallowing difficulty or temporary monocular blindness. The patient expresses their understanding.   - VAS US CAROTID; Future  2. Current tobacco use - Stable We had a discussion for approximately 10 minutes regarding the absolute need for smoking cessation due to the deleterious nature of tobacco on the vascular system. We discussed the tobacco use would diminish patency of any intervention, and likely significantly worsen progressio of disease. We discussed multiple agents for quitting including replacement therapy or medications to reduce cravings such as Chantix. The patient voices their understanding of the importance of smoking cessation.  3. Essential hypertension - Stable Encouraged good control as its slows the progression of atherosclerotic disease  Current  Outpatient Medications on File Prior to Visit  Medication Sig Dispense Refill  . albuterol (PROAIR HFA) 108 (90 BASE) MCG/ACT inhaler Inhale 2 puffs into the lungs every 6 (six) hours as needed.     Marland Kitchen amLODipine (NORVASC) 10 MG tablet Take 10 mg by mouth daily.     Marland Kitchen aspirin EC 81 MG tablet Take 81 mg by mouth daily.    Marland Kitchen atorvastatin (LIPITOR) 10 MG tablet Take 1 tablet (10 mg total) by mouth daily. 30 tablet 5  . clopidogrel (PLAVIX) 75 MG tablet Take 1 tablet (75 mg total) by mouth daily. 30 tablet 5  . diazepam (VALIUM) 5 MG tablet 1/2 pill BID 30 tablet 2  . fentaNYL (DURAGESIC - DOSED MCG/HR) 25 MCG/HR patch Place 25 mcg onto the skin every 3 (three) days.    . Fluticasone-Salmeterol (ADVAIR DISKUS) 250-50 MCG/DOSE AEPB Inhale 1 puff into the lungs 2 (two) times daily.     Marland Kitchen losartan-hydrochlorothiazide (HYZAAR) 100-25 MG per tablet TAKE 1 TABLET BY MOUTH DAILY.    . meloxicam (MOBIC) 15 MG tablet Take 15 mg by mouth daily.    . mometasone (NASONEX) 50 MCG/ACT nasal spray Place 1 spray into the nose daily.    . montelukast (SINGULAIR) 10 MG tablet TAKE 1 TABLET BY MOUTH DAILY    . oxyCODONE (ROXICODONE) 15 MG immediate release tablet Take 15 mg by mouth every 6 (six) hours as needed for pain.     Marland Kitchen QUEtiapine (SEROQUEL) 100 MG tablet Take 1 tablet (100 mg total) by mouth at bedtime. 90 tablet 1  . ranitidine (ZANTAC) 150 MG capsule Take 150 mg by mouth 2 (two) times daily.     . sertraline (ZOLOFT) 100 MG tablet Take 1 tablet (100 mg total) by mouth daily. 90 tablet 1  . theophylline (UNIPHYL) 400 MG 24 hr tablet Take 400 mg by mouth daily.    Marland Kitchen umeclidinium-vilanterol (ANORO ELLIPTA) 62.5-25 MCG/INH AEPB Inhale 1 puff into the lungs daily.    . busPIRone (BUSPAR) 10 MG tablet Take 1 tablet (10 mg total) by mouth 3 (three) times daily. (Patient not taking: Reported on 02/24/2018) 90 tablet 3  . liothyronine (CYTOMEL) 50 MCG tablet Take 50  mcg by mouth daily.     Marland Kitchen loratadine (CLARITIN) 10  MG tablet Take 10 mg by mouth daily.     Marland Kitchen sulfamethoxazole-trimethoprim (BACTRIM DS,SEPTRA DS) 800-160 MG tablet Take 2 tablets by mouth 2 (two) times daily. (Patient not taking: Reported on 01/22/2018) 40 tablet 0  . tiotropium (SPIRIVA HANDIHALER) 18 MCG inhalation capsule Place 18 mcg into inhaler and inhale daily.      No current facility-administered medications on file prior to visit.    There are no Patient Instructions on file for this visit. No follow-ups on file.  Brienna Bass A Caleen Taaffe, PA-C

## 2018-02-25 ENCOUNTER — Encounter (INDEPENDENT_AMBULATORY_CARE_PROVIDER_SITE_OTHER): Payer: Self-pay | Admitting: Vascular Surgery

## 2018-02-26 ENCOUNTER — Ambulatory Visit (INDEPENDENT_AMBULATORY_CARE_PROVIDER_SITE_OTHER): Payer: Medicare Other | Admitting: Psychiatry

## 2018-02-26 ENCOUNTER — Other Ambulatory Visit: Payer: Self-pay

## 2018-02-26 ENCOUNTER — Encounter: Payer: Self-pay | Admitting: Psychiatry

## 2018-02-26 VITALS — BP 135/61 | HR 100 | Temp 98.1°F | Wt 153.4 lb

## 2018-02-26 DIAGNOSIS — F331 Major depressive disorder, recurrent, moderate: Secondary | ICD-10-CM | POA: Diagnosis not present

## 2018-02-26 DIAGNOSIS — F411 Generalized anxiety disorder: Secondary | ICD-10-CM | POA: Diagnosis not present

## 2018-02-26 DIAGNOSIS — F1320 Sedative, hypnotic or anxiolytic dependence, uncomplicated: Secondary | ICD-10-CM

## 2018-02-26 DIAGNOSIS — F410 Panic disorder [episodic paroxysmal anxiety] without agoraphobia: Secondary | ICD-10-CM | POA: Diagnosis not present

## 2018-02-26 MED ORDER — SERTRALINE HCL 50 MG PO TABS: 125.0000 mg | ORAL_TABLET | Freq: Every day | ORAL | 1 refills | Status: DC

## 2018-02-26 MED ORDER — DIAZEPAM 5 MG PO TABS: 2.5000 mg | ORAL_TABLET | Freq: Two times a day (BID) | ORAL | 1 refills | Status: DC | PRN

## 2018-02-26 MED ORDER — HYDROXYZINE HCL 10 MG PO TABS: 10.0000 mg | ORAL_TABLET | Freq: Two times a day (BID) | ORAL | 1 refills | Status: DC | PRN

## 2018-02-26 MED ORDER — QUETIAPINE FUMARATE ER 50 MG PO TB24: 100.0000 mg | ORAL_TABLET | Freq: Every day | ORAL | 1 refills | Status: DC

## 2018-02-26 MED ORDER — GABAPENTIN 100 MG PO CAPS: 100.0000 mg | ORAL_CAPSULE | Freq: Two times a day (BID) | ORAL | 1 refills | Status: DC

## 2018-02-26 NOTE — Progress Notes (Signed)
[facility] Progress Note  02/26/2018 10:41 AM Shirley Alvarado  MRN:  [id number]  Chief Complaint: ' I am here for follow up.' Chief Complaint    Follow-up; Medication Refill     HPI: Shirley Alvarado is a 67 year old Caucasian female, widowed, lives in [place], has a history of depression, anxiety, panic attacks, benzodiazepine dependence-prescribed, multiple medical problems including COPD, history of colon cancer, HTN , is on oxygen portable, presented to the clinic today for a follow-up visit.  Pt used  to follow-up with Dr. Gretel Acre here in this clinic.  This is patient's first visit with Probation officer.  Patient had called the clinic reporting she was very anxious and that the BuSpar is making her nauseous.  Patient was advised to come into the clinic since Dr. Gretel Acre is not here for the next 1 month.  Patient today reports she is extremely anxious.  She reports panic attacks on a regular basis.  She describes her panic symptoms as feeling nervous, on edge, racing heart rate, and he will inability to function.  She reports she has been taking diazepam however she does not think the current dosage as helpful.  Patient reports she tried the BuSpar and she does not like the effect.  Patient reports she continues to take the Seroquel and it helps her to sleep.  She however reports she has some excessive grogginess.  Discussed changing it into an extended release and she agrees with plan.  Patient denies any suicidality.  Patient denies any perceptual disturbances.  Patient reports she has good social support from her brother and her sons.  Discussed the adverse effect of being on the benzodiazepine given her age and being on polypharmacy.  Patient is also on narcotic pain medication due to her history of pain.  Discussed with her that she needs to be weaned off of the diazepam slowly.  Discussed adding another medication to help with her anxiety.  She agrees to adding gabapentin.  She however reports she wants  something as needed and hence discussed hydroxyzine.  Discussed with patient that hydroxyzine can also affect her memory and cognitive function.  Discussed with her to limit the use.  Provided her written instruction to wean herself off of the diazepam.  She will start skipping 1 dose on Tuesdays and Thursdays.  Also discussed referral for psychotherapy.  Will refer her to Ms. Miguel Dibble. Visit Diagnosis:    ICD-10-CM   1. Major depressive disorder, recurrent episode, moderate (HCC) F33.1   2. GAD (generalized anxiety disorder) F41.1   3. Benzodiazepine dependence (HCC) F13.20    prescribed  4. Panic disorder F41.0     Past Psychiatric History: History of depression, anxiety, panic attacks.  Patient used to follow up with Dr. Gretel Acre in this clinic.  Past Medical History:  Past Medical History:  Diagnosis Date  . Anxiety   . Basal cell carcinoma   . Cancer (HCC)    COLON  . Chronic pain   . COPD (chronic obstructive pulmonary disease) ([place])   . Depression   . GERD (gastroesophageal reflux disease)   . Hypertension   . Ventral hernia     Past Surgical History:  Procedure Laterality Date  . CAROTID PTA/STENT INTERVENTION Right 01/25/2018   Procedure: CAROTID PTA/STENT INTERVENTION;  Surgeon: Algernon Huxley, MD;  Location: [facility];  Service: Cardiovascular;  Laterality: Right;  . CATARACT EXTRACTION W/PHACO Left 08/04/2016   Procedure: CATARACT EXTRACTION PHACO AND INTRAOCULAR LENS PLACEMENT (IOC);  Surgeon: Rodena Piety  Thomes Lolling, MD;  Location: [facility];  Service: Ophthalmology;  Laterality: Left;  LEFT  . COLON SURGERY    . TUBAL LIGATION      Family Psychiatric History: Mother -anxiety disorder, Father-anxiety disorder.  Family History:  Family History  Problem Relation Age of Onset  . Lung cancer Mother   . Hypertension Mother   . Diabetes Mother   . Anxiety disorder Mother   . Depression Mother   . Breast cancer Mother   . Hypertension  Father   . Diabetes Father   . Heart attack Father   . Depression Father   . Anxiety disorder Father   . Alcohol abuse Father   . Drug abuse Father   . Cancer Sister   . Diabetes Sister   . Hypertension Sister   . Obesity Sister   . Hypertension Brother   . Hypertension Brother   . Liver disease Brother   . Colon cancer Brother   . Breast cancer Maternal Aunt   . Breast cancer Maternal Aunt   . Breast cancer Maternal Aunt     Social History: Pt is widowed.  She has 2 adult son.  She has good social support from them.  She also has a brother who lives across from her.  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  Patient reports she goes to church and has good friends. Social History   Socioeconomic History  . Marital status: Widowed    Spouse name: Not on file  . Number of children: 2  . Years of education: Not on file  . Highest education level: 11th grade  Occupational History    Comment: retired  Scientific laboratory technician  . Financial resource strain: Very hard  . Food insecurity:    Worry: Sometimes true    Inability: Sometimes true  . Transportation needs:    Medical: No    Non-medical: No  Tobacco Use  . Smoking status: Current Every Day Smoker    Packs/day: 0.50    Years: 50.00    Pack years: 25.00    Types: Cigarettes    Start date: 05/21/1970  . Smokeless tobacco: Never Used  . Tobacco comment: However patient relates that she smoked for 50 years and at one point she was up to 2 packs per day  Substance and Sexual Activity  . Alcohol use: No    Alcohol/week: 0.0 oz  . Drug use: No  . Sexual activity: Not Currently  Lifestyle  . Physical activity:    Days per week: 0 days    Minutes per session: 0 min  . Stress: Only a little  Relationships  . Social connections:    Talks on phone: More than three times a week    Gets together: Once a week    Attends religious service: More than 4 times per year    Active member of club or organization: Yes    Attends meetings of  clubs or organizations: More than 4 times per year    Relationship status: Widowed  Other Topics Concern  . Not on file  Social History Narrative  . Not on file    Allergies:  Allergies  Allergen Reactions  . Minocycline Anaphylaxis    Throat swelling  . Levofloxacin Hives    Metabolic Disorder Labs: No results found for: HGBA1C, MPG No results found for: PROLACTIN Lab Results  Component Value Date   CHOL 172 08/29/2013   TRIG 153 08/29/2013   HDL 65 (H) 08/29/2013  VLDL 31 08/29/2013   LDLCALC 76 08/29/2013   No results found for: TSH  Therapeutic Level Labs: No results found for: LITHIUM No results found for: VALPROATE No components found for:  CBMZ  Current Medications: Current Outpatient Medications  Medication Sig Dispense Refill  . albuterol (PROAIR HFA) 108 (90 BASE) MCG/ACT inhaler Inhale 2 puffs into the lungs every 6 (six) hours as needed.     Marland Kitchen amLODipine (NORVASC) 10 MG tablet Take 10 mg by mouth daily.     Marland Kitchen aspirin EC 81 MG tablet Take 81 mg by mouth daily.    Marland Kitchen atorvastatin (LIPITOR) 10 MG tablet Take 1 tablet (10 mg total) by mouth daily. 30 tablet 5  . clopidogrel (PLAVIX) 75 MG tablet Take 1 tablet (75 mg total) by mouth daily. 30 tablet 5  . fentaNYL (DURAGESIC - DOSED MCG/HR) 25 MCG/HR patch Place 25 mcg onto the skin every 3 (three) days.    . Fluticasone-Salmeterol (ADVAIR DISKUS) 250-50 MCG/DOSE AEPB Inhale 1 puff into the lungs 2 (two) times daily.     Marland Kitchen loratadine (CLARITIN) 10 MG tablet Take 10 mg by mouth daily.     Marland Kitchen losartan-hydrochlorothiazide (HYZAAR) 100-25 MG per tablet TAKE 1 TABLET BY MOUTH DAILY.    . meloxicam (MOBIC) 15 MG tablet Take 15 mg by mouth daily.    . mometasone (NASONEX) 50 MCG/ACT nasal spray Place 1 spray into the nose daily.    . montelukast (SINGULAIR) 10 MG tablet TAKE 1 TABLET BY MOUTH DAILY    . oxyCODONE (ROXICODONE) 15 MG immediate release tablet Take 15 mg by mouth every 6 (six) hours as needed for pain.      . ranitidine (ZANTAC) 150 MG capsule Take 150 mg by mouth 2 (two) times daily.     Marland Kitchen sulfamethoxazole-trimethoprim (BACTRIM DS,SEPTRA DS) 800-160 MG tablet Take 2 tablets by mouth 2 (two) times daily. 40 tablet 0  . theophylline (UNIPHYL) 400 MG 24 hr tablet Take 400 mg by mouth daily.    Marland Kitchen tiotropium (SPIRIVA HANDIHALER) 18 MCG inhalation capsule Place 18 mcg into inhaler and inhale daily.     Marland Kitchen umeclidinium-vilanterol (ANORO ELLIPTA) 62.5-25 MCG/INH AEPB Inhale 1 puff into the lungs daily.    Derrill Memo ON 03/04/2018] diazepam (VALIUM) 5 MG tablet Take 0.5 tablets (2.5 mg total) by mouth 2 (two) times daily as needed for anxiety (only for severe anxiety sx). 30 tablet 1  . gabapentin (NEURONTIN) 100 MG capsule Take 1 capsule (100 mg total) by mouth 2 (two) times daily. 60 capsule 1  . hydrOXYzine (ATARAX/VISTARIL) 10 MG tablet Take 1 tablet (10 mg total) by mouth 2 (two) times daily as needed (only for severe anxiety sx). 60 tablet 1  . liothyronine (CYTOMEL) 50 MCG tablet Take 50 mcg by mouth daily.     . QUEtiapine (SEROQUEL XR) 50 MG TB24 24 hr tablet Take 2 tablets (100 mg total) by mouth at bedtime. 30 tablet 1  . sertraline (ZOLOFT) 50 MG tablet Take 2.5 tablets (125 mg total) by mouth daily. 75 tablet 1   No current facility-administered medications for this visit.      Musculoskeletal: Strength & Muscle Tone: within normal limits Gait & Station: normal Patient leans: N/A  Psychiatric Specialty Exam: Review of Systems  Psychiatric/Behavioral: Positive for depression. The patient is nervous/anxious.   All other systems reviewed and are negative.   Blood pressure 135/61, pulse 100, temperature 98.1 F (36.7 C), temperature source Oral, weight 153 lb 6.4 oz (  69.6 kg), SpO2 99 %.Body mass index is 26.33 kg/m.  General Appearance: Casual  Eye Contact:  Fair  Speech:  Normal Rate  Volume:  Normal  Mood:  Anxious  Affect:  Congruent  Thought Process:  Goal Directed and  Descriptions of Associations: Intact  Orientation:  Full (Time, Place, and Person)  Thought Content: Logical   Suicidal Thoughts:  No  Homicidal Thoughts:  No  Memory:  Immediate;   Fair Recent;   Fair Remote;   Fair  Judgement:  Fair  Insight:  Fair  Psychomotor Activity:  Normal  Concentration:  Concentration: Fair and Attention Span: Fair  Recall:  [employer] of Knowledge: Fair  Language: Fair  Akathisia:  No  Handed:  Right  AIMS (if indicated): denies tremors, rigidity. stiffness  Assets:  Communication Skills Desire for Improvement Housing Social Support  ADL's:  Intact  Cognition: WNL  Sleep:  Fair   Screenings:   Assessment and Plan: Yesica is a 67 year old Caucasian female who has a history of depression, anxiety, panic attacks, benzodiazepine dependence-prescribed, COPD, oxygen dependent, hypertension, chronic pain, history of colon cancer, presented to the clinic today for a follow-up visit.  Patient used to follow up with Dr. Gretel Acre here in this clinic.  Patient reports her anxiety symptoms as worsening.  She also reports she may be running out of her Valium because she may have been taking a few extra doses.  Patient reports side effects to the BuSpar and does not want to stay on it.  Discussed medication changes with patient.  She denies any suicidality.  She has good social support system.  Plan as noted below.  Plan  For MDD Will increase Zoloft to 125 mg p.o. daily Change Seroquel to extended release 100 mg p.o. every afternoon. Add gabapentin 100 mg p.o. twice daily. We will discontinue BuSpar for side effects Will refer patient for CBT to Ms. Miguel Dibble.,  Will fax a referral form to her.  For anxiety symptoms Discussed with patient about weaning of Valium.  Provided her written instruction to stop taking 1 dose of Valium on Tuesdays and Thursdays until she returns to clinic.  She will return to clinic in 4 weeks. She will start gabapentin 100 mg p.o.  twice daily which will help with anxiety symptoms She will also have hydroxyzine 10 mg p.o. twice daily as needed for anxiety attacks, however discussed with her to limit use due to the effect on her cognition.  Will refer patient for CBT with Ms. Miguel Dibble.  Patient provided education about benzodiazepine risk, I have printed out handouts and given to patient today.  I have reviewed [place] controlled substance database.  Have reviewed medical records in EHR form Dr. Gretel Acre.  Follow-up in clinic in 4 weeks or sooner if needed.  More than 50 % of the time was spent for psychoeducation and supportive psychotherapy and care coordination.  This note was generated in part or whole with voice recognition software. Voice recognition is usually quite accurate but there are transcription errors that can and very often do occur. I apologize for any typographical errors that were not detected and corrected.      Ursula Alert, MD 02/26/2018, 12:24 PM

## 2018-02-26 NOTE — Patient Instructions (Addendum)
Diazepam tablets What is this medicine? DIAZEPAM (dye AZ e pam) is a benzodiazepine. It is used to treat anxiety and nervousness. It also can help treat alcohol withdrawal, relax muscles, and treat certain types of seizures. This medicine may be used for other purposes; ask your health care provider or pharmacist if you have questions. COMMON BRAND NAME(S): Valium What should I tell my health care provider before I take this medicine? They need to know if you have any of these conditions -an alcohol or drug abuse problem -bipolar disorder, depression, psychosis or other mental health condition -glaucoma -kidney or liver disease -lung or breathing disease -myasthenia gravis -Parkinson's disease -seizures or a history of seizures -suicidal thoughts -an unusual or allergic reaction to diazepam, other benzodiazepines, foods, dyes, or preservatives -pregnant or trying to get pregnant -breast-feeding How should I use this medicine? Take this medicine by mouth with a glass of water. Follow the directions on the prescription label. If this medicine upsets your stomach, take it with food or milk. Take your doses at regular intervals. Do not take your medicine more often than directed. If you have been taking this medicine regularly for some time, do not suddenly stop taking it. You must gradually reduce the dose or you may get severe side effects. Ask your doctor or health care professional for advice. Even after you stop taking this medicine it can still affect your body for several days. A special MedGuide will be given to you by the pharmacist with each prescription and refill. Be sure to read this information carefully each time. Talk to your pediatrician regarding the use of this medicine in children. Special care may be needed. Overdosage: If you think you have taken too much of this medicine contact a poison control center or emergency room at once. NOTE: This medicine is  only for you. Do not share this medicine with others. What if I miss a dose? If you miss a dose, take it as soon as you can. If it is almost time for your next dose, take only that dose. Do not take double or extra doses. What may interact with this medicine? Do not take this medicine with any of the following medications: -narcotic medicines for cough -sodium oxybate This medicine may also interact with the following medications: -alcohol -antacids -antihistamines for allergy, cough and cold -certain antibiotics like clarithromycin, erythromycin, rifampin -certain medicines for anxiety or sleep -certain medicines for blood pressure, heart disease, irregular heartbeat -certain medicines for depression, like amitriptyline, fluoxetine, sertraline -certain medicines for fungal infections like ketoconazole, itraconazole, clotrimazole -certain medicines for psychotic disturbances -certain medicines for seizures like carbamazepine, phenobarbital, phenytoin, primidone, valproate -cimetidine -cyclosporine -dexamethasone -general anesthetics like lidocaine, pramoxine, tetracaine -MAOIs like Carbex, Eldepryl, Marplan, Nardil, and Parnate -medicines that relax muscles for surgery -narcotic medicines for pain -omeprazole -paclitaxel -phenothiazines like chlorpromazine, mesoridazine, prochlorperazine, thioridazine -theophyline -warfarin This list may not describe all possible interactions. Give your health care provider a list of all the medicines, herbs, non-prescription drugs, or dietary supplements you use. Also tell them if you smoke, drink alcohol, or use illegal drugs. Some items may interact with your medicine. What should I watch for while using this medicine? Tell your doctor or health care professional if your symptoms do not start to get better or if they get worse. Do not stop taking except on your doctor's advice. You may develop a severe reaction. Your doctor will tell you how much  medicine to take. You may get drowsy or dizzy. Do not drive, use machinery, or do anything that needs mental alertness until you know how this medicine affects you. To reduce the risk of dizzy and fainting spells, do not stand or sit up quickly, especially if you are an older patient. Alcohol may increase dizziness and drowsiness. Avoid alcoholic drinks. If you are taking another medicine that also causes drowsiness, you may have more side effects. Give your health care provider a list of all medicines you use. Your doctor will tell you how much medicine to take. Do not take more medicine than directed. Call emergency for help if you have problems breathing or unusual sleepiness. What side effects may I notice from receiving this medicine? Side effects that you should report to your doctor or health care professional as soon as possible: -allergic reactions like skin rash, itching or hives, swelling of the face, lips, or tongue -breathing problems -confusion -loss of balance or coordination -signs and symptoms of low blood pressure like dizziness; feeling faint or lightheaded, falls; unusually weak or tired -suicidal thoughts or other mood changes -trouble passing urine or change in the amount of urine Side effects that usually do not require medical attention (report to your doctor or health care professional if they continue or are bothersome): -dizziness -headache -nausea, vomiting -tiredness This list may not describe all possible side effects. Call your doctor for medical advice about side effects. You may report side effects to FDA at 1-800-FDA-1088. Where should I keep my medicine? Keep out of the reach of children. This medicine can be abused. Keep your medicine in a safe place to protect it from theft. Do not share this medicine with anyone. Selling or giving away this medicine is dangerous and against the law. This medicine may cause accidental overdose and death if taken by other  adults, children, or pets. Mix any unused medicine with a substance like cat litter or coffee grounds. Then throw the medicine away in a sealed container like a sealed bag or a coffee can with a lid. Do not use the medicine after the expiration date. Store at room temperature between 15 and 30 degrees C (59 and 86 degrees F). Protect from light. Keep container tightly closed. NOTE: This sheet is a summary. It may not cover all possible information. If you have questions about this medicine, talk to your doctor, pharmacist, or health care provider.  2018 Elsevier/Gold Standard (2015-06-22 10:52:36)    Seroquel has been changed to XR - You will start taking XR 50 mg ( 2 tablets)   Your Zoloft has been changed to 125 mg tablet daily.  Your Diazepam remains 2.5 mg twice a day - but please try to limit use.   Please skip one dose of Diazepam two days a week - Tuesdays and thursdays .  The long term goal is to wean you off of it slowly.  You are now on Gabapentin for anxiety sx.  You can also take Vistaril 10 mg as needed , if you need it for anxiety sx , but limit use and please do not take it daily .  You are being referred for psychotherapy for your panic attacks.

## 2018-03-03 ENCOUNTER — Telehealth: Payer: Self-pay

## 2018-03-03 NOTE — Telephone Encounter (Signed)
gabapentin she can not take she states it making her feel worse, she feels shakey and just doesnt feel right

## 2018-03-04 NOTE — Telephone Encounter (Signed)
pt called states that she she can not take that medication and she had pain and shaking .

## 2018-03-04 NOTE — Telephone Encounter (Signed)
PLS ASK HER TO STOP GABAPENTIN DUE TO SIDE EFFECTS. SHE CAN START THERAPY WITH TINA. SHE CAN CONTINUE OTHER MEDICATIONS AS SCHEDULED.  IF SHE WANTS SHE CAN COME IN FOR AN EARLIER APPOINTMENT.

## 2018-03-29 ENCOUNTER — Ambulatory Visit: Payer: Medicare Other | Admitting: Psychiatry

## 2018-04-06 ENCOUNTER — Encounter: Payer: Self-pay | Admitting: Psychiatry

## 2018-04-06 ENCOUNTER — Other Ambulatory Visit: Payer: Self-pay

## 2018-04-06 ENCOUNTER — Ambulatory Visit (INDEPENDENT_AMBULATORY_CARE_PROVIDER_SITE_OTHER): Payer: Medicare Other | Admitting: Psychiatry

## 2018-04-06 VITALS — BP 152/69 | HR 101 | Temp 98.5°F | Wt 153.0 lb

## 2018-04-06 DIAGNOSIS — F410 Panic disorder [episodic paroxysmal anxiety] without agoraphobia: Secondary | ICD-10-CM

## 2018-04-06 DIAGNOSIS — F331 Major depressive disorder, recurrent, moderate: Secondary | ICD-10-CM | POA: Diagnosis not present

## 2018-04-06 DIAGNOSIS — F411 Generalized anxiety disorder: Secondary | ICD-10-CM | POA: Diagnosis not present

## 2018-04-06 DIAGNOSIS — F1320 Sedative, hypnotic or anxiolytic dependence, uncomplicated: Secondary | ICD-10-CM | POA: Diagnosis not present

## 2018-04-06 MED ORDER — SERTRALINE HCL 100 MG PO TABS: 150.0000 mg | ORAL_TABLET | Freq: Every day | ORAL | 0 refills | Status: DC

## 2018-04-06 MED ORDER — DIAZEPAM 5 MG PO TABS: 2.5000 mg | ORAL_TABLET | Freq: Two times a day (BID) | ORAL | 1 refills | Status: DC | PRN

## 2018-04-06 MED ORDER — QUETIAPINE FUMARATE ER 50 MG PO TB24: 100.0000 mg | ORAL_TABLET | Freq: Every day | ORAL | 0 refills | Status: DC

## 2018-04-06 NOTE — Patient Instructions (Signed)
Please start skipping one dose of Valium mondays, wednesdays and fridays.

## 2018-04-06 NOTE — Progress Notes (Signed)
[facility] MD OP Progress Note  04/06/2018 10:36 AM Shirley Alvarado  MRN:  [id number]  Chief Complaint: ' I am here for follow up." Chief Complaint    Follow-up; Medication Refill     HPI: Shirley Alvarado is a 67 year old Caucasian female, widowed, lives in [place], has a history of depression, anxiety, panic attacks, benzodiazepine dependence-prescribed, multiple medical problems including COPD, history of colon cancer, hypertension, oxygen dependent, presented to the clinic today for a follow-up visit.  Patient used to follow-up with Dr. Gretel Acre.  This is her second visit with Probation officer.  Patient reports she was unable to take the gabapentin due to side effects.  She is completely off of it now.  She reports her pain provider started her on Lyrica which not only helps with pain but also for anxiety symptoms and that has been really helpful.  She continues to be compliant with her Zoloft and takes 125 mg daily.  Patient reports sleep is fair.  She denies any significant racing thoughts or restlessness at night.  She does have pain problems which is chronic and she is on pain management.  Patient continues to wean herself off of the Valium and is currently doing well.  She reports she has been trying to skip doses at least 2 days a week and she has been okay with that.  Patient denies any suicidality.  Patient denies any perceptual disturbances.  Patient reports she has started psychotherapy with Ms. Miguel Dibble which is currently going well. Visit Diagnosis:    ICD-10-CM   1. Major depressive disorder, recurrent episode, moderate (HCC) F33.1 sertraline (ZOLOFT) 100 MG tablet    QUEtiapine (SEROQUEL XR) 50 MG TB24 24 hr tablet  2. GAD (generalized anxiety disorder) F41.1 sertraline (ZOLOFT) 100 MG tablet    diazepam (VALIUM) 5 MG tablet  3. Benzodiazepine dependence (HCC) F13.20   4. Panic disorder F41.0 sertraline (ZOLOFT) 100 MG tablet    diazepam (VALIUM) 5 MG tablet    Past Psychiatric History:  Have reviewed past psychiatric history from my progress note on 02/26/2018.  Past Medical History:  Past Medical History:  Diagnosis Date  . Anxiety   . Basal cell carcinoma   . Cancer (HCC)    COLON  . Chronic pain   . COPD (chronic obstructive pulmonary disease) ([place])   . Depression   . GERD (gastroesophageal reflux disease)   . Hypertension   . Ventral hernia     Past Surgical History:  Procedure Laterality Date  . CAROTID PTA/STENT INTERVENTION Right 01/25/2018   Procedure: CAROTID PTA/STENT INTERVENTION;  Surgeon: Algernon Huxley, MD;  Location: [facility];  Service: Cardiovascular;  Laterality: Right;  . CATARACT EXTRACTION W/PHACO Left 08/04/2016   Procedure: CATARACT EXTRACTION PHACO AND INTRAOCULAR LENS PLACEMENT (IOC);  Surgeon: Ronnell Freshwater, MD;  Location: [facility];  Service: Ophthalmology;  Laterality: Left;  LEFT  . COLON SURGERY    . TUBAL LIGATION      Family Psychiatric History: I have reviewed family psychiatric history from my progress note on 02/26/2018.  Family History:  Family History  Problem Relation Age of Onset  . Lung cancer Mother   . Hypertension Mother   . Diabetes Mother   . Anxiety disorder Mother   . Depression Mother   . Breast cancer Mother   . Hypertension Father   . Diabetes Father   . Heart attack Father   . Depression Father   . Anxiety disorder Father   . Alcohol abuse  Father   . Drug abuse Father   . Cancer Sister   . Diabetes Sister   . Hypertension Sister   . Obesity Sister   . Hypertension Brother   . Hypertension Brother   . Liver disease Brother   . Colon cancer Brother   . Breast cancer Maternal Aunt   . Breast cancer Maternal Aunt   . Breast cancer Maternal Aunt    Substance abuse history: Denies  Social History: I have reviewed social history from my progress note on 02/26/2018. Social History   Socioeconomic History  . Marital status: Widowed    Spouse name: Not on file  .  Number of children: 2  . Years of education: Not on file  . Highest education level: 11th grade  Occupational History    Comment: retired  Scientific laboratory technician  . Financial resource strain: Very hard  . Food insecurity:    Worry: Sometimes true    Inability: Sometimes true  . Transportation needs:    Medical: No    Non-medical: No  Tobacco Use  . Smoking status: Current Every Day Smoker    Packs/day: 0.50    Years: 50.00    Pack years: 25.00    Types: Cigarettes    Start date: 05/21/1970  . Smokeless tobacco: Never Used  . Tobacco comment: However patient relates that she smoked for 50 years and at one point she was up to 2 packs per day  Substance and Sexual Activity  . Alcohol use: No    Alcohol/week: 0.0 oz  . Drug use: No  . Sexual activity: Not Currently  Lifestyle  . Physical activity:    Days per week: 0 days    Minutes per session: 0 min  . Stress: Only a little  Relationships  . Social connections:    Talks on phone: More than three times a week    Gets together: Once a week    Attends religious service: More than 4 times per year    Active member of club or organization: Yes    Attends meetings of clubs or organizations: More than 4 times per year    Relationship status: Widowed  Other Topics Concern  . Not on file  Social History Narrative  . Not on file    Allergies:  Allergies  Allergen Reactions  . Minocycline Anaphylaxis    Throat swelling  . Levofloxacin Hives    Metabolic Disorder Labs: No results found for: HGBA1C, MPG No results found for: PROLACTIN Lab Results  Component Value Date   CHOL 172 08/29/2013   TRIG 153 08/29/2013   HDL 65 (H) 08/29/2013   VLDL 31 08/29/2013   LDLCALC 76 08/29/2013   No results found for: TSH  Therapeutic Level Labs: No results found for: LITHIUM No results found for: VALPROATE No components found for:  CBMZ  Current Medications: Current Outpatient Medications  Medication Sig Dispense Refill  . naloxone  (NARCAN) nasal spray 4 mg/0.1 mL 4 mg (1 nasal spray) prn for opioid overdose; repeat every 2 to 3 minutes in alternating nostrils until medical assistance available    . albuterol (PROAIR HFA) 108 (90 BASE) MCG/ACT inhaler Inhale 2 puffs into the lungs every 6 (six) hours as needed.     Marland Kitchen amLODipine (NORVASC) 10 MG tablet Take 10 mg by mouth daily.     Marland Kitchen aspirin EC 81 MG tablet Take 81 mg by mouth daily.    Marland Kitchen atorvastatin (LIPITOR) 10 MG tablet Take 1 tablet (  10 mg total) by mouth daily. 30 tablet 5  . clopidogrel (PLAVIX) 75 MG tablet Take 1 tablet (75 mg total) by mouth daily. 30 tablet 5  . diazepam (VALIUM) 5 MG tablet Take 0.5 tablets (2.5 mg total) by mouth 2 (two) times daily as needed for anxiety (only for severe anxiety sx). 24 tablet 1  . fentaNYL (DURAGESIC - DOSED MCG/HR) 25 MCG/HR patch Place 25 mcg onto the skin every 3 (three) days.    . Fluticasone-Salmeterol (ADVAIR DISKUS) 250-50 MCG/DOSE AEPB Inhale 1 puff into the lungs 2 (two) times daily.     . hydrOXYzine (ATARAX/VISTARIL) 10 MG tablet Take 1 tablet (10 mg total) by mouth 2 (two) times daily as needed (only for severe anxiety sx). 60 tablet 1  . liothyronine (CYTOMEL) 50 MCG tablet Take 50 mcg by mouth daily.     Marland Kitchen loratadine (CLARITIN) 10 MG tablet Take 10 mg by mouth daily.     Marland Kitchen losartan-hydrochlorothiazide (HYZAAR) 100-25 MG per tablet TAKE 1 TABLET BY MOUTH DAILY.    . meloxicam (MOBIC) 15 MG tablet Take 15 mg by mouth daily.    . mometasone (NASONEX) 50 MCG/ACT nasal spray Place 1 spray into the nose daily.    . montelukast (SINGULAIR) 10 MG tablet TAKE 1 TABLET BY MOUTH DAILY    . oxyCODONE (ROXICODONE) 15 MG immediate release tablet Take 15 mg by mouth every 6 (six) hours as needed for pain.     . predniSONE (DELTASONE) 10 MG tablet TAKE 4 PILLS FOR 2 DAYS, 3 PILLS FOR 2 DAYS, 2 PILLS FOR 2 DAYS, 1 PILL FOR 2 DAYS.  0  . QUEtiapine (SEROQUEL XR) 50 MG TB24 24 hr tablet Take 2 tablets (100 mg total) by mouth at  bedtime. 90 tablet 0  . ranitidine (ZANTAC) 150 MG capsule Take 150 mg by mouth 2 (two) times daily.     . sertraline (ZOLOFT) 100 MG tablet Take 1.5 tablets (150 mg total) by mouth daily. 135 tablet 0  . sulfamethoxazole-trimethoprim (BACTRIM DS,SEPTRA DS) 800-160 MG tablet Take 2 tablets by mouth 2 (two) times daily. 40 tablet 0  . theophylline (UNIPHYL) 400 MG 24 hr tablet Take 400 mg by mouth daily.    Marland Kitchen tiotropium (SPIRIVA HANDIHALER) 18 MCG inhalation capsule Place 18 mcg into inhaler and inhale daily.     Marland Kitchen umeclidinium-vilanterol (ANORO ELLIPTA) 62.5-25 MCG/INH AEPB Inhale 1 puff into the lungs daily.     No current facility-administered medications for this visit.      Musculoskeletal: Strength & Muscle Tone: within normal limits Gait & Station: normal Patient leans: N/A  Psychiatric Specialty Exam: Review of Systems  Psychiatric/Behavioral: The patient is nervous/anxious.   All other systems reviewed and are negative.   Blood pressure (!) 152/69, pulse (!) 101, temperature 98.5 F (36.9 C), temperature source Oral, weight 153 lb (69.4 kg), SpO2 93 %.Body mass index is 26.26 kg/m.  General Appearance: Casual  Eye Contact:  Fair  Speech:  Normal Rate  Volume:  Normal  Mood:  Anxious  Affect:  Congruent  Thought Process:  Goal Directed and Descriptions of Associations: Intact  Orientation:  Full (Time, Place, and Person)  Thought Content: Logical   Suicidal Thoughts:  No  Homicidal Thoughts:  No  Memory:  Immediate;   Fair Recent;   Fair Remote;   Fair  Judgement:  Fair  Insight:  Fair  Psychomotor Activity:  Normal  Concentration:  Concentration: Fair and Attention Span: Fair  Recall:  [place] of Knowledge: Fair  Language: Fair  Akathisia:  No  Handed:  Right  AIMS (if indicated): denies tremors, rigidity, stiffness  Assets:  Communication Skills Desire for Improvement Social Support  ADL's:  Intact  Cognition: WNL  Sleep:  improving    Screenings:   Assessment and Plan: Raychell is a 67 year old Caucasian female who has a history of depression, anxiety, panic attacks, benzodiazepine dependence-prescribed, COPD, oxygen dependent, hypertension, chronic pain, history of colon cancer, presented to the clinic today for a follow-up visit.  Patient currently reports some anxiety symptoms however is coping with being weaned off the Valium.  She denies any significant withdrawal symptoms.  She will continue psychotherapy with Ms. Grandville Silos.  Continue plan as noted below.  Plan MDD Increase Zoloft to 150 mg p.o. daily Continue Seroquel extended release 100 mg p.o. nightly Discontinue gabapentin for side effects She is on Lyrica prescribed by her other provider for pain which also helps with anxiety symptoms She will continue psychotherapy with Ms. Miguel Dibble which is going well.  For anxiety symptoms Patient will continue to wean off Valium.  Discussed with her to start taking only 1 dose of Valium 3 days a week-Mondays Wednesdays and Fridays.  Rest of the days she will take 2.5 mg Valium twice a day. Have reviewed [place] controlled substance database. Increase Zoloft to 150 mg p.o. daily. Lyrica as prescribed by her provider.  Follow-up in clinic in 1 month.  More than 50 % of the time was spent for psychoeducation and supportive psychotherapy and care coordination.  This note was generated in part or whole with voice recognition software. Voice recognition is usually quite accurate but there are transcription errors that can and very often do occur. I apologize for any typographical errors that were not detected and corrected.       Ursula Alert, MD 04/07/2018, 8:28 AM

## 2018-04-07 ENCOUNTER — Encounter: Payer: Self-pay | Admitting: Psychiatry

## 2018-04-07 ENCOUNTER — Telehealth: Payer: Self-pay

## 2018-04-07 MED ORDER — QUETIAPINE FUMARATE 100 MG PO TABS: 100.0000 mg | ORAL_TABLET | Freq: Every day | ORAL | 1 refills | Status: DC

## 2018-04-07 NOTE — Telephone Encounter (Signed)
pt wanted states she wants the reg quetiapine not the xr states she has a hard time waking up in the morninng.

## 2018-04-07 NOTE — Telephone Encounter (Signed)
Sent regular seroquel to pharmacy.

## 2018-05-05 ENCOUNTER — Encounter: Payer: Self-pay | Admitting: *Deleted

## 2018-05-05 ENCOUNTER — Emergency Department
Admission: EM | Admit: 2018-05-05 | Discharge: 2018-05-05 | Disposition: A | Discharge status: Home / Self Care | Payer: Medicare Other | Attending: Emergency Medicine | Admitting: Emergency Medicine

## 2018-05-05 ENCOUNTER — Telehealth (INDEPENDENT_AMBULATORY_CARE_PROVIDER_SITE_OTHER): Payer: Self-pay | Admitting: Vascular Surgery

## 2018-05-05 ENCOUNTER — Other Ambulatory Visit: Payer: Self-pay

## 2018-05-05 DIAGNOSIS — R1030 Lower abdominal pain, unspecified: Secondary | ICD-10-CM | POA: Diagnosis present

## 2018-05-05 DIAGNOSIS — Z79899 Other long term (current) drug therapy: Secondary | ICD-10-CM | POA: Insufficient documentation

## 2018-05-05 DIAGNOSIS — I10 Essential (primary) hypertension: Secondary | ICD-10-CM | POA: Insufficient documentation

## 2018-05-05 DIAGNOSIS — R3911 Hesitancy of micturition: Secondary | ICD-10-CM | POA: Diagnosis not present

## 2018-05-05 DIAGNOSIS — Z7982 Long term (current) use of aspirin: Secondary | ICD-10-CM | POA: Insufficient documentation

## 2018-05-05 DIAGNOSIS — E871 Hypo-osmolality and hyponatremia: Secondary | ICD-10-CM | POA: Insufficient documentation

## 2018-05-05 DIAGNOSIS — Z85038 Personal history of other malignant neoplasm of large intestine: Secondary | ICD-10-CM | POA: Insufficient documentation

## 2018-05-05 DIAGNOSIS — F17210 Nicotine dependence, cigarettes, uncomplicated: Secondary | ICD-10-CM | POA: Insufficient documentation

## 2018-05-05 DIAGNOSIS — J449 Chronic obstructive pulmonary disease, unspecified: Secondary | ICD-10-CM | POA: Insufficient documentation

## 2018-05-05 DIAGNOSIS — Z85828 Personal history of other malignant neoplasm of skin: Secondary | ICD-10-CM | POA: Diagnosis not present

## 2018-05-05 LAB — COMPREHENSIVE METABOLIC PANEL
ALT: 19 U/L (ref 0–44)
AST: 30 U/L (ref 15–41)
Albumin: 4.5 g/dL (ref 3.5–5.0)
Alkaline Phosphatase: 64 U/L (ref 38–126)
Anion gap: 13 (ref 5–15)
BUN: 9 mg/dL (ref 8–23)
CO2: 36 mmol/L — ABNORMAL HIGH (ref 22–32)
Calcium: 9.7 mg/dL (ref 8.9–10.3)
Chloride: 79 mmol/L — ABNORMAL LOW (ref 98–111)
Creatinine, Ser: 0.51 mg/dL (ref 0.44–1.00)
GFR calc Af Amer: >60 mL/min (ref >60)
GFR calc non Af Amer: >60 mL/min (ref >60)
Glucose, Bld: 165 mg/dL — ABNORMAL HIGH (ref 70–99)
Potassium: 3.4 mmol/L — ABNORMAL LOW (ref 3.5–5.1)
Sodium: 128 mmol/L — ABNORMAL LOW (ref 135–145)
Total Bilirubin: 0.5 mg/dL (ref 0.3–1.2)
Total Protein: 7.7 g/dL (ref 6.5–8.1)

## 2018-05-05 LAB — URINALYSIS, COMPLETE (UACMP) WITH MICROSCOPIC
Bilirubin Urine: NEGATIVE
Glucose, UA: NEGATIVE mg/dL
Ketones, ur: NEGATIVE mg/dL
Leukocytes, UA: NEGATIVE
Nitrite: NEGATIVE
Protein, ur: 30 mg/dL — AB
Specific Gravity, Urine: 1.009 (ref 1.005–1.030)
pH: 7 (ref 5.0–8.0)

## 2018-05-05 LAB — CBC
HCT: 36.8 % (ref 35.0–47.0)
Hemoglobin: 12.9 g/dL (ref 12.0–16.0)
MCH: 32.9 pg (ref 26.0–34.0)
MCHC: 35.1 g/dL (ref 32.0–36.0)
MCV: 93.5 fL (ref 80.0–100.0)
Platelets: 359 10*3/uL (ref 150–440)
RBC: 3.94 MIL/uL (ref 3.80–5.20)
RDW: 14.2 % (ref 11.5–14.5)
WBC: 6.4 10*3/uL (ref 3.6–11.0)

## 2018-05-05 LAB — LIPASE, BLOOD: Lipase: 29 U/L (ref 11–51)

## 2018-05-05 NOTE — ED Triage Notes (Addendum)
Pt to ED reporting she has not been able to urinate or have a BM since Sunday. Pain reported in lower stomach. Nausea but no vomiting and intermittent fevers at home.   Bladder scan in triage multiple times gave a max reading of 182mL. Pt tender when Bladder scanner pressed on lower abd.

## 2018-05-05 NOTE — Discharge Instructions (Addendum)
Your sodium is low, do not drink copious amounts of water during the day, stay hydrated by supplementing your intake with V8 as you suggest or Gatorade, be careful about the sugar content of Gatorade however.  There are Gatorade prep that do not have excess sugar and you may look for those.  We have offered you IV fluids CT scan and further work-up but you have declined that at this time.  Certainly your choice to do so but it limits her ability to evaluate you.  If you feel worse, or any other new or worrisome symptoms including chest pain shortness of breath increased abdominal pain change in her chronic abdominal pain fever numbness weakness incontinence of bowel or bladder, or any other concerns or change your mind about further work-up please return to the emergency department.  Otherwise, call your doctor for an appointment tomorrow.

## 2018-05-05 NOTE — ED Provider Notes (Addendum)
[facility] Emergency Department Provider Note  ____________________________________________   I have reviewed the triage vital signs and the nursing notes. Where available I have reviewed prior notes and, if possible and indicated, outside hospital notes.    HISTORY  Chief Complaint Abdominal Pain    HPI Shirley Alvarado is a 67 y.o. female   With a history of chronic abdominal pain "every day for years" for which she takes fentanyl and oxycodone.  Its abdominal in the lower abdomen.  She states she has a history of anxiety, colon cancer in the past, COPD, on home oxygen, presents today complaining that she is not making satisfying quantities of urine.  She states she is drinking water all the time.  She has been told not to drink too much water because it can lower her sodium but she very much loves water and has been drinking a lot of water.  Nonetheless, even though she is urinating she is feels that she has not had a satisfying urination since Sunday.  Today is Wednesday.  She denies dysuria urinary frequency incontinence of bowel or bladder, she states she has been a little bit constipated but that is not unusual for her.  She denies any change in her chronic abdominal pain.  She has a difficult time explaining to me why she is concerned about her urine except for "it just seems like not very much".  She states that she does not want IV fluid or CAT scan, and she wants to know if I can tell her she has urinalysis without actually checking her urine.  She feels this is actually unrelated to her chronic pain, she states that she just wants to make sure that she is not retaining urine.  She has no weakness or numbness.  No fall no back pain.  Her scan was 128 in the waiting room.  She states she did urinate right before coming in.      Past Medical History:  Diagnosis Date  . Anxiety   . Basal cell carcinoma   . Cancer (HCC)    COLON  . Chronic pain   . COPD  (chronic obstructive pulmonary disease) ([place])   . Depression   . GERD (gastroesophageal reflux disease)   . Hypertension   . Ventral hernia     Patient Active Problem List   Diagnosis Date Noted  . Carotid stenosis, right 01/25/2018  . Carotid stenosis 01/08/2018  . LBP (low back pain) 12/24/2015  . Infected hernioplasty mesh ([place])   . Pulmonary emphysema ([place])   . Other long term (current) drug therapy 07/06/2015  . Long term current use of opiate analgesic 07/06/2015  . 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 Foot pain 10/12/2014  . Plantar verruca 10/12/2014  . Continuous opioid dependence ([place]) 06/09/2014  . Advance directive discussed with patient 04/11/2013  . 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 Adult BMI 30+ 11/23/2012  . Lung mass 11/23/2012  . Compulsive tobacco user syndrome 11/23/2012  . Current tobacco use 11/23/2012  . Chronic pain 04/27/2012  . Cancer of colon ([place]) 07/29/2011  . CAFL (chronic airflow limitation) ([place]) 07/29/2011  . Clinical depression 07/29/2011  . BP (high blood pressure) 07/29/2011  . Hernia of anterior abdominal wall 07/29/2011  . Malignant neoplasm of colon ([place]) 07/29/2011  . Chronic obstructive pulmonary disease ([place]) 07/29/2011    Past Surgical History:  Procedure  Laterality Date  . CAROTID PTA/STENT INTERVENTION Right 01/25/2018   Procedure: CAROTID PTA/STENT INTERVENTION;  Surgeon: Algernon Huxley, MD;  Location: [facility];  Service: Cardiovascular;  Laterality: Right;  . CATARACT EXTRACTION W/PHACO Left 08/04/2016   Procedure: CATARACT EXTRACTION PHACO AND INTRAOCULAR LENS PLACEMENT (IOC);  Surgeon: Ronnell Freshwater, MD;  Location: [facility];  Service: Ophthalmology;  Laterality: Left;  LEFT  . COLON SURGERY    . TUBAL LIGATION      Prior to Admission medications   Medication Sig Start  Date End Date Taking? Authorizing Provider  albuterol (PROAIR HFA) 108 (90 BASE) MCG/ACT inhaler Inhale 2 puffs into the lungs every 6 (six) hours as needed.  03/23/15   [provider]  amLODipine (NORVASC) 10 MG tablet Take 10 mg by mouth daily.  09/11/14   [provider]  aspirin EC 81 MG tablet Take 81 mg by mouth daily.    [provider]  atorvastatin (LIPITOR) 10 MG tablet Take 1 tablet (10 mg total) by mouth daily. 01/27/18 01/27/19  Algernon Huxley, MD  clopidogrel (PLAVIX) 75 MG tablet Take 1 tablet (75 mg total) by mouth daily. 01/27/18   Algernon Huxley, MD  diazepam (VALIUM) 5 MG tablet Take 0.5 tablets (2.5 mg total) by mouth 2 (two) times daily as needed for anxiety (only for severe anxiety sx). 04/06/18   Ursula Alert, MD  fentaNYL (DURAGESIC - DOSED MCG/HR) 25 MCG/HR patch Place 25 mcg onto the skin every 3 (three) days.    [provider]  Fluticasone-Salmeterol (ADVAIR DISKUS) 250-50 MCG/DOSE AEPB Inhale 1 puff into the lungs 2 (two) times daily.  03/23/15   [provider]  hydrOXYzine (ATARAX/VISTARIL) 10 MG tablet Take 1 tablet (10 mg total) by mouth 2 (two) times daily as needed (only for severe anxiety sx). 02/26/18   Ursula Alert, MD  liothyronine (CYTOMEL) 50 MCG tablet Take 50 mcg by mouth daily.  01/30/15 01/25/18  [provider]  loratadine (CLARITIN) 10 MG tablet Take 10 mg by mouth daily.  01/30/15   [provider]  losartan-hydrochlorothiazide (HYZAAR) 100-25 MG per tablet TAKE 1 TABLET BY MOUTH DAILY. 12/11/14   [provider]  meloxicam (MOBIC) 15 MG tablet Take 15 mg by mouth daily. 03/11/17   [provider]  mometasone (NASONEX) 50 MCG/ACT nasal spray Place 1 spray into the nose daily. 03/01/17   [provider]  montelukast (SINGULAIR) 10 MG tablet TAKE 1 TABLET BY MOUTH DAILY 04/12/15   [provider]  naloxone [facility]) nasal spray 4 mg/0.1 mL 4 mg (1 nasal spray) prn for opioid  overdose; repeat every 2 to 3 minutes in alternating nostrils until medical assistance available 03/16/18   [provider]  oxyCODONE (ROXICODONE) 15 MG immediate release tablet Take 15 mg by mouth every 6 (six) hours as needed for pain.  03/15/17   [provider]  predniSONE (DELTASONE) 10 MG tablet TAKE 4 PILLS FOR 2 DAYS, 3 PILLS FOR 2 DAYS, 2 PILLS FOR 2 DAYS, 1 PILL FOR 2 DAYS. 03/29/18   [provider]  QUEtiapine (SEROQUEL) 100 MG tablet Take 1 tablet (100 mg total) by mouth at bedtime. 04/07/18   Ursula Alert, MD  ranitidine (ZANTAC) 150 MG capsule Take 150 mg by mouth 2 (two) times daily.  03/26/15   [provider]  sertraline (ZOLOFT) 100 MG tablet Take 1.5 tablets (150 mg total) by mouth daily. 04/06/18   Ursula Alert, MD  sulfamethoxazole-trimethoprim (BACTRIM DS,SEPTRA DS) 800-160 MG tablet Take 2 tablets by mouth 2 (two) times daily. 03/24/17   Schaevitz, Randall An, MD  theophylline (UNIPHYL) 400 MG 24 hr tablet Take 400 mg by mouth daily. 02/12/17   [provider]  tiotropium (SPIRIVA HANDIHALER) 18 MCG inhalation capsule Place 18 mcg into inhaler and inhale daily.  03/23/15   [provider]  umeclidinium-vilanterol (ANORO ELLIPTA) 62.5-25 MCG/INH AEPB Inhale 1 puff into the lungs daily.    [provider]    Allergies Minocycline and Levofloxacin  Family History  Problem Relation Age of Onset  . Lung cancer Mother   . Hypertension Mother   . Diabetes Mother   . Anxiety disorder Mother   . Depression Mother   . Breast cancer Mother   . Hypertension Father   . Diabetes Father   . Heart attack Father   . Depression Father   . Anxiety disorder Father   . Alcohol abuse Father   . Drug abuse Father   . Cancer Sister   . Diabetes Sister   . Hypertension Sister   . Obesity Sister   . Hypertension Brother   . Hypertension Brother   . Liver disease Brother   . Colon cancer Brother   . Breast cancer  Maternal Aunt   . Breast cancer Maternal Aunt   . Breast cancer Maternal Aunt     Social History Social History   Tobacco Use  . Smoking status: Current Every Day Smoker    Packs/day: 0.50    Years: 50.00    Pack years: 25.00    Types: Cigarettes    Start date: 05/21/1970  . Smokeless tobacco: Never Used  . Tobacco comment: However patient relates that she smoked for 50 years and at one point she was up to 2 packs per day  Substance Use Topics  . Alcohol use: No    Alcohol/week: 0.0 oz  . Drug use: No    Review of Systems Constitutional: No fever/chills Eyes: No visual changes. ENT: No sore throat. No stiff neck no neck pain Cardiovascular: Denies chest pain. Respiratory: Denies shortness of breath. Gastrointestinal:   no vomiting.  No diarrhea.  No constipation. Genitourinary: Negative for dysuria. Musculoskeletal: Negative lower extremity swelling Skin: Negative for rash. Neurological: Negative for severe headaches, focal weakness or numbness.   ____________________________________________   PHYSICAL EXAM:  VITAL SIGNS: ED Triage Vitals [05/05/18 1309]  Enc Vitals Group     BP (!) 121/52     Pulse Rate 89     Resp 16     Temp 98.4 F (36.9 C)     Temp Source Oral     SpO2 94 %     Weight 158 lb (71.7 kg)     Height 5\' 4"  (1.626 m)     Head Circumference      Peak Flow      Pain Score 10     Pain Loc      Pain Edu?      Excl. in [place]?     Constitutional: Alert and oriented. Well appearing and in no acute distress. Eyes: Conjunctivae are normal Head: Atraumatic HEENT: No congestion/rhinnorhea. Mucous membranes are moist.  Oropharynx non-erythematous Neck:   Nontender with no meningismus, no masses, no stridor Cardiovascular: Normal rate, regular rhythm. Grossly normal heart sounds.  Good peripheral circulation. Respiratory: Normal respiratory effort.  No retractions. Lungs CTAB. Abdominal: Soft and soft and minimal discomfort in the suprapubic region.  No distention.  No guarding no rebound Back:  There is no focal tenderness or step off.  there is no midline tenderness there are no lesions noted. there is slight bilateral CVA tenderness GU: Declined Musculoskeletal: No lower extremity tenderness, no upper extremity tenderness. No joint effusions, no DVT signs strong distal pulses no edema Neurologic:  Normal speech and language. No gross focal neurologic deficits are appreciated.  Skin:  Skin is warm, dry and intact. No rash noted. Psychiatric: Mood and affect are anxious. Speech and behavior are normal.  ____________________________________________   LABS (all labs ordered are listed, but only abnormal results are displayed)  Labs Reviewed  COMPREHENSIVE METABOLIC PANEL - Abnormal; Notable for the following components:      Result Value   Sodium 128 (*)    Potassium 3.4 (*)    Chloride 79 (*)    CO2 36 (*)    Glucose, Bld 165 (*)    All other components within normal limits  URINE CULTURE  LIPASE, BLOOD  CBC  URINALYSIS, COMPLETE (UACMP) WITH MICROSCOPIC    Pertinent labs  results that were available during my care of the patient were reviewed by me and considered in my medical decision making (see chart for details). ____________________________________________  EKG  I personally interpreted any EKGs ordered by me or triage  ____________________________________________  RADIOLOGY  Pertinent labs & imaging results that were available during my care of the patient were reviewed by me and considered in my medical decision making (see chart for details). If possible, patient and/or family made aware of any abnormal findings.  No results found. ____________________________________________    PROCEDURES  Procedure(s) performed: None  Procedures  Critical Care performed: None  ____________________________________________   INITIAL IMPRESSION / ASSESSMENT AND PLAN / ED COURSE  Pertinent labs & imaging results  that were available during my care of the patient were reviewed by me and considered in my medical decision making (see chart for details).  Patient here with her son, she states that she does not want IV fluid for hyponatremia even though I did explain the possible outcomes of significant hyponatremia which she has not yet suffering from, she did not want a CT scan for her abdominal pain and urinary symptoms or any other imaging, she states she has had enough of those, she just wants to know if she has a UTI.  Patient initially declined to give me a urine sample and I did explain to her several times that I could not perform a UA without it and she has now agreed to give me a urine sample.  She denies any fever or vomiting, has reassuring vital signs and blood work here.  Sodium again is somewhat low, but she refuses any intervention.  I think she has medical decision-making capacity.  She understands risk benefits alternative.  She states she will go home and drink Gatorade or V8 juice.  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  Her son tells me that she has been recommended not to take too much water at home already and sometimes does not comply.  As far as CT scan is concerned, she does have pain and she is a little bit tender on exam but she is adamant that she does not want any imaging.  I explained with her history that could be bowel obstructions or other pathology that would require imaging to  determine, and she refuses.  She states she has had enough CAT scans.  Again, family are in the room, I certainly cannot compel this patient to do any test that she does not want to do.  So, we will do her best to work around that.  Fortunately, she has no white count she has no fever, not really complaining of dysuria, she does states she is not having satisfying urine output with no evidence of  urinary retention.  We will see if she can produce a urine sample which she now states she can do and we will go from there  ----------------------------------------- 3:23 PM on 05/05/2018 -----------------------------------------  She was informed that we could not do a urinalysis without a urine sample, immediately produced a urine sample.  ----------------------------------------- 4:03 PM on 05/05/2018 -----------------------------------------  Patient remains in no acute distress at this time she is demanding discharge, she states that if I cannot tell what is going on from her blood work and her urine she is probably fine.  I have explained to her that she would do better with a CT scan to rule out other pathologies that could be causing her discomfort and her urinary hesitancy and she refuses.  She also refuses IV fluid.  She and her family are adamant that they want to go home.  She wants a "itemized list of what happened today" have a explained to her that she can get this from medical records.  She also wants a note verifying that she was here but she states is for her own good and not for any job or any other reason.  She states that she will try drinking V8 and Gatorade instead of copious amounts of water and she is eager to go home.  She refuses any further care and is demanding discharge at this moment.  I feel that she understands the risk benefits and alternatives of discharge and we will discharge her.  She understands she can come back at any time if she changes her mind about further work-up or has other concerns.   ____________________________________________   FINAL CLINICAL IMPRESSION(S) / ED DIAGNOSES  Final diagnoses:  None      This chart was dictated using voice recognition software.  Despite best efforts to proofread,  errors can occur which can change meaning.      Schuyler Amor, MD 05/05/18 1514    Schuyler Amor, MD 05/05/18 1523    Schuyler Amor, MD 05/05/18 [phone]

## 2018-05-05 NOTE — ED Notes (Signed)
Pt refused Dc vitals.

## 2018-05-05 NOTE — Telephone Encounter (Signed)
Spoke with the patient and advised that she call her PCP and be seen for this condition.

## 2018-05-06 LAB — URINE CULTURE: Culture: NO GROWTH

## 2018-05-13 ENCOUNTER — Other Ambulatory Visit: Payer: Self-pay | Admitting: Family Medicine

## 2018-05-13 DIAGNOSIS — Z1231 Encounter for screening mammogram for malignant neoplasm of breast: Secondary | ICD-10-CM

## 2018-06-01 ENCOUNTER — Ambulatory Visit: Payer: Medicare Other | Admitting: Psychiatry

## 2018-06-08 ENCOUNTER — Encounter (INDEPENDENT_AMBULATORY_CARE_PROVIDER_SITE_OTHER): Payer: Medicare Other

## 2018-06-08 ENCOUNTER — Ambulatory Visit (INDEPENDENT_AMBULATORY_CARE_PROVIDER_SITE_OTHER): Payer: Medicare Other | Admitting: Vascular Surgery

## 2018-06-14 ENCOUNTER — Ambulatory Visit: Payer: Medicare Other | Admitting: Psychiatry

## 2018-06-22 ENCOUNTER — Ambulatory Visit (INDEPENDENT_AMBULATORY_CARE_PROVIDER_SITE_OTHER): Payer: Medicare Other | Admitting: Psychiatry

## 2018-06-22 ENCOUNTER — Other Ambulatory Visit: Payer: Self-pay

## 2018-06-22 ENCOUNTER — Encounter: Payer: Self-pay | Admitting: Psychiatry

## 2018-06-22 VITALS — BP 132/69 | HR 98 | Temp 104.0°F | Wt 147.2 lb

## 2018-06-22 DIAGNOSIS — F410 Panic disorder [episodic paroxysmal anxiety] without agoraphobia: Secondary | ICD-10-CM

## 2018-06-22 DIAGNOSIS — F411 Generalized anxiety disorder: Secondary | ICD-10-CM | POA: Diagnosis not present

## 2018-06-22 DIAGNOSIS — F331 Major depressive disorder, recurrent, moderate: Secondary | ICD-10-CM

## 2018-06-22 DIAGNOSIS — F1320 Sedative, hypnotic or anxiolytic dependence, uncomplicated: Secondary | ICD-10-CM | POA: Diagnosis not present

## 2018-06-22 MED ORDER — QUETIAPINE FUMARATE 100 MG PO TABS: 150.0000 mg | ORAL_TABLET | Freq: Every day | ORAL | 0 refills | Status: DC

## 2018-06-22 MED ORDER — QUETIAPINE FUMARATE 25 MG PO TABS: 12.5000 mg | ORAL_TABLET | Freq: Every day | ORAL | 0 refills | Status: DC | PRN

## 2018-06-22 MED ORDER — DIAZEPAM 5 MG PO TABS: 2.5000 mg | ORAL_TABLET | Freq: Two times a day (BID) | ORAL | 1 refills | Status: DC | PRN

## 2018-06-22 NOTE — Progress Notes (Signed)
[facility] MD OP Progress Note  06/22/2018 2:57 PM Shirley Shirley Alvarado  MRN:  [id number]  Chief Complaint: ' I am here for follow up ." Chief Complaint    Follow-up; Medication Refill     HPI: Shirley Shirley Alvarado is a 67 year old Caucasian female, widowed, lives in [place], has a history of depression, anxiety, panic attacks, benzodiazepine dependence-prescribed, COPD, history of colon cancer, hypertension, oxygen dependent, presented to the clinic today for a follow-up visit.  Patient presented wearing a mask and a portable oxygen tank with her.  Patient reports she continues to be anxious and in pain.  She reports her pain is making her anxiety symptoms worse.  Patient however reports she is not hopeless or depressed.  She however struggles with sleep on a regular basis.  She reports the Seroquel may be helping to some extent but she is increased in a dosage increase.  Patient continues to have panic attacks.  She reports she has been limiting the use of Valium as discussed and does not use it every day.  Patient reports racing heart rate, shortness of breath and nervousness.  Patient was referred to Miguel Dibble for psychotherapy session however reports she could not go for the appointments because summer months makes her sick physically.  Patient however reports she is going to call her and schedule an appointment as soon as possible.   Patient with recent hyponatremia, discussed repeating her BMP.  Also discussed the effect of medications like Zoloft on her sodium level.  Patient for now will continue the Valium as needed and will continue to limit use as much as possible.  She is aware about the long-term risk of being on benzodiazepine therapy.  Also given her COPD as well as her age her risk is higher.  Also discussed making use of Seroquel 25 mg as needed during the day for significant panic symptoms.  Discussed with her that she can use it as needed and does not have to use it daily.  Patient denies any  suicidality.  Patient denies any perceptual disturbances.  Patient continues to have good support system from her family.  She looks forward to her sister who is moving in  with her beginning of October. Visit Diagnosis:    ICD-10-CM   1. GAD (generalized anxiety disorder) F41.1 diazepam (VALIUM) 5 MG tablet  2. Panic disorder F41.0 diazepam (VALIUM) 5 MG tablet  3. Major depressive disorder, recurrent episode, moderate (HCC) F33.1    in early remission  4. Benzodiazepine dependence (HCC) F13.20     Past Psychiatric History: Reviewed past psychiatric history from my progress note on 02/26/2018  Past Medical History:  Past Medical History:  Diagnosis Date  . Anxiety   . Basal cell carcinoma   . Cancer (HCC)    COLON  . Chronic pain   . COPD (chronic obstructive pulmonary disease) ([place])   . Depression   . GERD (gastroesophageal reflux disease)   . Hypertension   . Ventral hernia     Past Surgical History:  Procedure Laterality Date  . CAROTID PTA/STENT INTERVENTION Right 01/25/2018   Procedure: CAROTID PTA/STENT INTERVENTION;  Surgeon: Algernon Huxley, MD;  Location: [facility];  Service: Cardiovascular;  Laterality: Right;  . CATARACT EXTRACTION W/PHACO Left 08/04/2016   Procedure: CATARACT EXTRACTION PHACO AND INTRAOCULAR LENS PLACEMENT (IOC);  Surgeon: Ronnell Freshwater, MD;  Location: [facility];  Service: Ophthalmology;  Laterality: Left;  LEFT  . COLON SURGERY    . TUBAL LIGATION  Family Psychiatric History: Reviewed family psychiatric history from my progress note on 02/26/2018  Family History:  Family History  Problem Relation Age of Onset  . Lung cancer Mother   . Hypertension Mother   . Diabetes Mother   . Anxiety disorder Mother   . Depression Mother   . Breast cancer Mother   . Hypertension Father   . Diabetes Father   . Heart attack Father   . Depression Father   . Anxiety disorder Father   . Alcohol abuse Father   . Drug  abuse Father   . Cancer Sister   . Diabetes Sister   . Hypertension Sister   . Obesity Sister   . Hypertension Brother   . Hypertension Brother   . Liver disease Brother   . Colon cancer Brother   . Breast cancer Maternal Aunt   . Breast cancer Maternal Aunt   . Breast cancer Maternal Aunt     Social History: Reviewed social history from my progress note on 02/26/2018 Social History   Socioeconomic History  . Marital status: Widowed    Spouse name: Not on file  . Number of children: 2  . Years of education: Not on file  . Highest education level: 11th grade  Occupational History    Comment: retired  Scientific laboratory technician  . Financial resource strain: Very hard  . Food insecurity:    Worry: Sometimes true    Inability: Sometimes true  . Transportation needs:    Medical: No    Non-medical: No  Tobacco Use  . Smoking status: Current Every Day Smoker    Packs/day: 0.50    Years: 50.00    Pack years: 25.00    Types: Cigarettes    Start date: 05/21/1970  . Smokeless tobacco: Never Used  . Tobacco comment: However patient relates that she smoked for 50 years and at one point she was up to 2 packs per day  Substance and Sexual Activity  . Alcohol use: No    Alcohol/week: 0.0 standard drinks  . Drug use: No  . Sexual activity: Not Currently  Lifestyle  . Physical activity:    Days per week: 0 days    Minutes per session: 0 min  . Stress: Only a little  Relationships  . Social connections:    Talks on phone: More than three times a week    Gets together: Once a week    Attends religious service: More than 4 times per year    Active member of club or organization: Yes    Attends meetings of clubs or organizations: More than 4 times per year    Relationship status: Widowed  Other Topics Concern  . Not on file  Social History Narrative  . Not on file    Allergies:  Allergies  Allergen Reactions  . Minocycline Anaphylaxis    Throat swelling  . Levofloxacin Hives     Metabolic Disorder Labs: No results found for: HGBA1C, MPG No results found for: PROLACTIN Lab Results  Component Value Date   CHOL 172 08/29/2013   TRIG 153 08/29/2013   HDL 65 (H) 08/29/2013   VLDL 31 08/29/2013   LDLCALC 76 08/29/2013   No results found for: TSH  Therapeutic Level Labs: No results found for: LITHIUM No results found for: VALPROATE No components found for:  CBMZ  Current Medications: Current Outpatient Medications  Medication Sig Dispense Refill  . albuterol (PROAIR HFA) 108 (90 BASE) MCG/ACT inhaler Inhale 2 puffs into the  lungs every 6 (six) hours as needed.     Marland Kitchen amLODipine (NORVASC) 10 MG tablet Take 10 mg by mouth daily.     Marland Kitchen aspirin EC 81 MG tablet Take 81 mg by mouth daily.    Marland Kitchen atorvastatin (LIPITOR) 10 MG tablet Take 1 tablet (10 mg total) by mouth daily. 30 tablet 5  . clopidogrel (PLAVIX) 75 MG tablet Take 1 tablet (75 mg total) by mouth daily. 30 tablet 5  . diazepam (VALIUM) 5 MG tablet Take 0.5 tablets (2.5 mg total) by mouth 2 (two) times daily as needed for anxiety (only for severe anxiety sx). 24 tablet 1  . fentaNYL (DURAGESIC - DOSED MCG/HR) 25 MCG/HR patch Place 25 mcg onto the skin every 3 (three) days.    . Fluticasone-Salmeterol (ADVAIR DISKUS) 250-50 MCG/DOSE AEPB Inhale 1 puff into the lungs 2 (two) times daily.     Marland Kitchen loratadine (CLARITIN) 10 MG tablet Take 10 mg by mouth daily.     Marland Kitchen losartan-hydrochlorothiazide (HYZAAR) 100-25 MG per tablet TAKE 1 TABLET BY MOUTH DAILY.    . meloxicam (MOBIC) 15 MG tablet Take 15 mg by mouth daily.    . mometasone (NASONEX) 50 MCG/ACT nasal spray Place 1 spray into the nose daily.    . montelukast (SINGULAIR) 10 MG tablet TAKE 1 TABLET BY MOUTH DAILY    . naloxone (NARCAN) nasal spray 4 mg/0.1 mL 4 mg (1 nasal spray) prn for opioid overdose; repeat every 2 to 3 minutes in alternating nostrils until medical assistance available    . oxyCODONE (ROXICODONE) 15 MG immediate release tablet Take 15 mg  by mouth every 6 (six) hours as needed for pain.     . predniSONE (DELTASONE) 10 MG tablet TAKE 4 PILLS FOR 2 DAYS, 3 PILLS FOR 2 DAYS, 2 PILLS FOR 2 DAYS, 1 PILL FOR 2 DAYS.  0  . QUEtiapine (SEROQUEL) 100 MG tablet Take 1.5 tablets (150 mg total) by mouth at bedtime. 135 tablet 0  . QUEtiapine (SEROQUEL) 25 MG tablet Take 0.5-1 tablets (12.5-25 mg total) by mouth daily as needed. Only for severe agitation, anxiety sx 90 tablet 0  . ranitidine (ZANTAC) 150 MG capsule Take 150 mg by mouth 2 (two) times daily.     . sertraline (ZOLOFT) 100 MG tablet Take 1.5 tablets (150 mg total) by mouth daily. 135 tablet 0  . sulfamethoxazole-trimethoprim (BACTRIM DS,SEPTRA DS) 800-160 MG tablet Take 2 tablets by mouth 2 (two) times daily. 40 tablet 0  . theophylline (UNIPHYL) 400 MG 24 hr tablet Take 400 mg by mouth daily.    Marland Kitchen tiotropium (SPIRIVA HANDIHALER) 18 MCG inhalation capsule Place 18 mcg into inhaler and inhale daily.     Marland Kitchen umeclidinium-vilanterol (ANORO ELLIPTA) 62.5-25 MCG/INH AEPB Inhale 1 puff into the lungs daily.    Marland Kitchen liothyronine (CYTOMEL) 50 MCG tablet Take 50 mcg by mouth daily.      No current facility-administered medications for this visit.      Musculoskeletal: Strength & Muscle Tone: within normal limits Gait & Station: normal Patient leans: N/A  Psychiatric Specialty Exam: Review of Systems  Psychiatric/Behavioral: The patient is nervous/anxious and has insomnia.   All other systems reviewed and are negative.   Blood pressure 132/69, pulse 98, temperature (!) 104 F (40 C), temperature source Oral, weight 147 lb 3.2 oz (66.8 kg), SpO2 95 %.Body mass index is 25.27 kg/m.  General Appearance: Casual  Eye Contact:  Fair  Speech:  Normal Rate  Volume:  Normal  Mood:  Anxious  Affect:  Congruent  Thought Process:  Goal Directed and Descriptions of Associations: Intact  Orientation:  Full (Time, Place, and Person)  Thought Content: Logical   Suicidal Thoughts:  No   Homicidal Thoughts:  No  Memory:  Immediate;   Fair Recent;   Fair Remote;   Fair  Judgement:  Fair  Insight:  Fair  Psychomotor Activity:  Normal  Concentration:  Concentration: Fair and Attention Span: Fair  Recall:  [employer] of Knowledge: Fair  Language: Fair  Akathisia:  No  Handed:  Right  AIMS (if indicated): denies tremors, rigidity,stiffness  Assets:  Communication Skills Desire for Improvement Housing Social Support  ADL's:  Intact  Cognition: WNL  Sleep:  Poor   Screenings:   Assessment and Plan: Shirley Shirley Alvarado is a 67 year old Caucasian female who has a history of depression, anxiety, panic attacks, benzodiazepine dependence, COPD, oxygen dependent, hypertension, chronic pain, history of colon cancer, presented to the clinic today for a follow-up visit.  Patient continues to have anxiety symptoms and panic attacks.  Patient also has pain which makes her anxiety as well as sleep issues worse.  Patient will continue to follow-up with her pain provider as well as her primary medical doctor about her pain management.  Patient was recently noted as having hyponatremia.  Hence we will repeat labs.  Discussed with her to restrict fluid intake.  Also discussed with her the effect of Zoloft on her sodium level.  Patient to return to clinic after repeat labs.  For now will continue plan as noted below.  Plan MDD Continue Zoloft as prescribed. Continue Seroquel, however will increase to 150 mg p.o. nightly Add Seroquel 12.5-25 mg p.o. daily as needed for severe anxiety symptoms. She will continue psychotherapy with Ms. Miguel Dibble. PHQ 9 - 15  Anxiety symptoms Seroquel 12.5-25 mg p.o. daily as needed for severe anxiety symptoms Continue Valium as prescribed right now.  She will continue to limit use.  She is aware about the risk of being on benzodiazepine therapy long-term. Continue Zoloft as prescribed for now. GAD - 7 - 17  We will order the following labs-BMP since she  recently had low sodium level.  Discussed with her the effect of Zoloft on her sodium level.  She will also talk to her primary medical doctor about management of the same.  Follow-up in clinic in 4 weeks or sooner if needed.  More than 50 % of the time was spent for psychoeducation and supportive psychotherapy and care coordination.  This note was generated in part or whole with voice recognition software. Voice recognition is usually quite accurate but there are transcription errors that can and very often do occur. I apologize for any typographical errors that were not detected and corrected.       Ursula Alert, MD 06/22/2018, 2:57 PM

## 2018-07-06 ENCOUNTER — Ambulatory Visit (INDEPENDENT_AMBULATORY_CARE_PROVIDER_SITE_OTHER): Payer: Medicare Other | Admitting: Vascular Surgery

## 2018-07-06 ENCOUNTER — Ambulatory Visit (INDEPENDENT_AMBULATORY_CARE_PROVIDER_SITE_OTHER): Payer: Medicare Other

## 2018-07-06 ENCOUNTER — Encounter (INDEPENDENT_AMBULATORY_CARE_PROVIDER_SITE_OTHER): Payer: Self-pay | Admitting: Vascular Surgery

## 2018-07-06 VITALS — BP 126/64 | HR 94 | Resp 18 | Ht 63.0 in | Wt 146.0 lb

## 2018-07-06 DIAGNOSIS — I10 Essential (primary) hypertension: Secondary | ICD-10-CM

## 2018-07-06 DIAGNOSIS — G8929 Other chronic pain: Secondary | ICD-10-CM

## 2018-07-06 DIAGNOSIS — I6523 Occlusion and stenosis of bilateral carotid arteries: Secondary | ICD-10-CM

## 2018-07-06 DIAGNOSIS — F17210 Nicotine dependence, cigarettes, uncomplicated: Secondary | ICD-10-CM

## 2018-07-06 DIAGNOSIS — J449 Chronic obstructive pulmonary disease, unspecified: Secondary | ICD-10-CM | POA: Diagnosis not present

## 2018-07-06 DIAGNOSIS — Z95820 Peripheral vascular angioplasty status with implants and grafts: Secondary | ICD-10-CM

## 2018-07-06 NOTE — Assessment & Plan Note (Signed)
Her right carotid stent appears widely patent today with mildly elevated velocities that are likely compensatory from the contralateral occlusion. She really seems to be doing fairly well after her carotid stent placement.  We will stretch out her follow-up and see her back in 6 months.  She will contact our office with any problems in the interim.  Continue current medical regimen.

## 2018-07-06 NOTE — Progress Notes (Signed)
MRN : [id number]   Shirley Alvarado is a 67 y.o. (May 25, 1951) female who presents with chief complaint of  Chief Complaint  Patient presents with  . Carotid    3 month Carotid follow up  .  History of Present Illness: Patient returns in follow-up of her carotid disease.  She is about 5 months status post right carotid artery stent placement for high-grade symptomatic stenosis with contralateral occlusion and a litany of medical issues.  She looks well today.  She says she feels a fair bit better after the stent.  She has a lot more energy.  Denies any focal neurologic symptoms.  Her right carotid stent appears widely patent today with mildly elevated velocities that are likely compensatory from the contralateral occlusion.  Current Outpatient Medications  Medication Sig Dispense Refill  . albuterol (PROAIR HFA) 108 (90 BASE) MCG/ACT inhaler Inhale 2 puffs into the lungs every 6 (six) hours as needed.     Marland Kitchen amLODipine (NORVASC) 10 MG tablet Take 10 mg by mouth daily.     Marland Kitchen aspirin EC 81 MG tablet Take 81 mg by mouth daily.    Marland Kitchen atorvastatin (LIPITOR) 10 MG tablet Take 1 tablet (10 mg total) by mouth daily. 30 tablet 5  . clopidogrel (PLAVIX) 75 MG tablet Take 1 tablet (75 mg total) by mouth daily. 30 tablet 5  . diazepam (VALIUM) 5 MG tablet Take 0.5 tablets (2.5 mg total) by mouth 2 (two) times daily as needed for anxiety (only for severe anxiety sx). 24 tablet 1  . fentaNYL (DURAGESIC - DOSED MCG/HR) 25 MCG/HR patch Place 25 mcg onto the skin every 3 (three) days.    . Fluticasone-Salmeterol (ADVAIR DISKUS) 250-50 MCG/DOSE AEPB Inhale 1 puff into the lungs 2 (two) times daily.     Marland Kitchen liothyronine (CYTOMEL) 50 MCG tablet Take 50 mcg by mouth daily.     Marland Kitchen loratadine (CLARITIN) 10 MG tablet Take 10 mg by mouth daily.     Marland Kitchen losartan-hydrochlorothiazide (HYZAAR) 100-25 MG per tablet TAKE 1 TABLET BY MOUTH DAILY.    . meloxicam (MOBIC) 15 MG tablet Take 15 mg by mouth daily.    . mometasone  (NASONEX) 50 MCG/ACT nasal spray Place 1 spray into the nose daily.    . montelukast (SINGULAIR) 10 MG tablet TAKE 1 TABLET BY MOUTH DAILY    . naloxone (NARCAN) nasal spray 4 mg/0.1 mL 4 mg (1 nasal spray) prn for opioid overdose; repeat every 2 to 3 minutes in alternating nostrils until medical assistance available    . oxyCODONE (ROXICODONE) 15 MG immediate release tablet Take 15 mg by mouth every 6 (six) hours as needed for pain.     . predniSONE (DELTASONE) 10 MG tablet TAKE 4 PILLS FOR 2 DAYS, 3 PILLS FOR 2 DAYS, 2 PILLS FOR 2 DAYS, 1 PILL FOR 2 DAYS.  0  . QUEtiapine (SEROQUEL) 100 MG tablet Take 1.5 tablets (150 mg total) by mouth at bedtime. 135 tablet 0  . QUEtiapine (SEROQUEL) 25 MG tablet Take 0.5-1 tablets (12.5-25 mg total) by mouth daily as needed. Only for severe agitation, anxiety sx 90 tablet 0  . ranitidine (ZANTAC) 150 MG capsule Take 150 mg by mouth 2 (two) times daily.     . sertraline (ZOLOFT) 100 MG tablet Take 1.5 tablets (150 mg total) by mouth daily. 135 tablet 0  . sulfamethoxazole-trimethoprim (BACTRIM DS,SEPTRA DS) 800-160 MG tablet Take 2 tablets by mouth 2 (two) times daily. 40 tablet 0  .  theophylline (UNIPHYL) 400 MG 24 hr tablet Take 400 mg by mouth daily.    Marland Kitchen tiotropium (SPIRIVA HANDIHALER) 18 MCG inhalation capsule Place 18 mcg into inhaler and inhale daily.     Marland Kitchen umeclidinium-vilanterol (ANORO ELLIPTA) 62.5-25 MCG/INH AEPB Inhale 1 puff into the lungs daily.     No current facility-administered medications for this visit.     Past Medical History:  Diagnosis Date  . Anxiety   . Basal cell carcinoma   . Cancer (HCC)    COLON  . Chronic pain   . COPD (chronic obstructive pulmonary disease) ([place])   . Depression   . GERD (gastroesophageal reflux disease)   . Hypertension   . Ventral hernia     Past Surgical History:  Procedure Laterality Date  . CAROTID PTA/STENT INTERVENTION Right 01/25/2018   Procedure: CAROTID PTA/STENT INTERVENTION;  Surgeon:  Algernon Huxley, MD;  Location: [facility];  Service: Cardiovascular;  Laterality: Right;  . CATARACT EXTRACTION W/PHACO Left 08/04/2016   Procedure: CATARACT EXTRACTION PHACO AND INTRAOCULAR LENS PLACEMENT (IOC);  Surgeon: Ronnell Freshwater, MD;  Location: [facility];  Service: Ophthalmology;  Laterality: Left;  LEFT  . COLON SURGERY    . TUBAL LIGATION      Social History Social History   Tobacco Use  . Smoking status: Current Every Day Smoker    Packs/day: 0.50    Years: 50.00    Pack years: 25.00    Types: Cigarettes    Start date: 05/21/1970  . Smokeless tobacco: Never Used  . Tobacco comment: However patient relates that she smoked for 50 years and at one point she was up to 2 packs per day  Substance Use Topics  . Alcohol use: No    Alcohol/week: 0.0 standard drinks  . Drug use: No     Family History Family History  Problem Relation Age of Onset  . Lung cancer Mother   . Hypertension Mother   . Diabetes Mother   . Anxiety disorder Mother   . Depression Mother   . Breast cancer Mother   . Hypertension Father   . Diabetes Father   . Heart attack Father   . Depression Father   . Anxiety disorder Father   . Alcohol abuse Father   . Drug abuse Father   . Cancer Sister   . Diabetes Sister   . Hypertension Sister   . Obesity Sister   . Hypertension Brother   . Hypertension Brother   . Liver disease Brother   . Colon cancer Brother   . Breast cancer Maternal Aunt   . Breast cancer Maternal Aunt   . Breast cancer Maternal Aunt     Allergies  Allergen Reactions  . Minocycline Anaphylaxis    Throat swelling  . Levofloxacin Hives     REVIEW OF SYSTEMS(Negative unless checked)  Constitutional: [] Weight loss[] Fever[] Chills Cardiac:[] Chest pain[] Chest pressure[] Palpitations [] Shortness of breath when laying flat [x] Shortness of breath at rest [x] Shortness of breath with exertion. Vascular: [] Pain in legs with  walking[] Pain in legsat rest[] Pain in legs when laying flat [] Claudication [] Pain in feet when walking [] Pain in feet at rest [] Pain in feet when laying flat [] History of DVT [] Phlebitis [] Swelling in legs [] Varicose veins [] Non-healing ulcers Pulmonary: [x] Uses home oxygen [] Productive cough[] Hemoptysis [] Wheeze [x] COPD [] Asthma Neurologic: [] Dizziness [] Blackouts [] Seizures [] History of stroke [] History of TIA[] Aphasia [] Temporary blindness[] Dysphagia [] Weaknessor numbness in arms [] Weakness or numbnessin legs Musculoskeletal: [x] Arthritis [] Joint swelling [x] Joint pain [x] Low back pain Hematologic:[] Easy bruising[] Easy bleeding [] Hypercoagulable state [] Anemic []   Hepatitis Gastrointestinal:[] Blood in stool[] Vomiting blood[x] Gastroesophageal reflux/heartburn[] Abdominal pain Genitourinary: [] Chronic kidney disease [] Difficulturination [] Frequenturination [] Burning with urination[] Hematuria Skin: [] Rashes [] Ulcers [] Wounds Psychological: [] History of anxiety[] History of major depression.  Physical Examination  Vitals:   07/06/18 1110 07/06/18 1111  BP: 114/68 126/64  Pulse: 64 94  Resp: 18   Weight: 146 lb (66.2 kg)   Height: 5\' 3"  (1.6 m)    Body mass index is 25.86 kg/m. Gen:  WD/WN, NAD Head: [place]/AT, No temporalis wasting. Ear/Nose/Throat: Hearing grossly intact, nares w/o erythema or drainage, trachea midline Eyes: Conjunctiva clear. Sclera non-icteric Neck: Supple.  Left carotid bruit  Pulmonary:  Good air movement, mild wheezing and diminished breath sounds bilaterally Cardiac: RRR, No JVD Vascular:  Vessel Right Left  Radial Palpable Palpable                                    Musculoskeletal: M/S 5/5 throughout.  No deformity or atrophy.  Neurologic: CN 2-12 intact. Sensation grossly intact in extremities.  Symmetrical.  Speech is fluent. Motor exam as listed  above. Psychiatric: Judgment intact, Mood & affect appropriate for pt's clinical situation. Dermatologic: No rashes or ulcers noted.  No cellulitis or open wounds.      CBC Lab Results  Component Value Date   WBC 6.4 05/05/2018   HGB 12.9 05/05/2018   HCT 36.8 05/05/2018   MCV 93.5 05/05/2018   PLT 359 05/05/2018    BMET    Component Value Date/Time   NA 128 (L) 05/05/2018 1321   NA 134 (L) 08/29/2013 0500   K 3.4 (L) 05/05/2018 1321   K 3.4 (L) 08/29/2013 0500   CL 79 (L) 05/05/2018 1321   CL 95 (L) 08/29/2013 0500   CO2 36 (H) 05/05/2018 1321   CO2 37 (H) 08/29/2013 0500   GLUCOSE 165 (H) 05/05/2018 1321   GLUCOSE 95 08/29/2013 0500   BUN 9 05/05/2018 1321   BUN 8 08/29/2013 0500   CREATININE 0.51 05/05/2018 1321   CREATININE 0.51 (L) 08/29/2013 0500   CALCIUM 9.7 05/05/2018 1321   CALCIUM 9.5 08/29/2013 0500   GFRNONAA >60 05/05/2018 1321   GFRNONAA >60 08/29/2013 0500   GFRAA >60 05/05/2018 1321   GFRAA >60 08/29/2013 0500   CrCl cannot be calculated (Patient's most recent lab result is older than the maximum 21 days allowed.).  COAG No results found for: INR, PROTIME  Radiology No results found.   Assessment/Plan BP (high blood pressure) blood pressure control important in reducing the progression of atherosclerotic disease. On appropriate oral medications.   Chronic obstructive pulmonary disease ([place]) On home oxygen. One of the reasons we chose stenting over surgery.  Sees pulmonologist  Chronic pain On a fentanyl patch  Carotid stenosis Her right carotid stent appears widely patent today with mildly elevated velocities that are likely compensatory from the contralateral occlusion. She really seems to be doing fairly well after her carotid stent placement.  We will stretch out her follow-up and see her back in 6 months.  She will contact our office with any problems in the interim.  Continue current medical regimen.    Leotis Pain,  MD  07/06/2018 11:57 AM    This note was created with Dragon medical transcription system.  Any errors from dictation are purely unintentional

## 2018-07-18 ENCOUNTER — Other Ambulatory Visit (INDEPENDENT_AMBULATORY_CARE_PROVIDER_SITE_OTHER): Payer: Self-pay | Admitting: Vascular Surgery

## 2018-07-22 ENCOUNTER — Ambulatory Visit: Payer: Medicare Other | Admitting: Psychiatry

## 2018-08-17 ENCOUNTER — Encounter: Payer: Self-pay | Admitting: Psychiatry

## 2018-08-17 ENCOUNTER — Other Ambulatory Visit: Payer: Self-pay

## 2018-08-17 ENCOUNTER — Ambulatory Visit (INDEPENDENT_AMBULATORY_CARE_PROVIDER_SITE_OTHER): Payer: Medicare Other | Admitting: Psychiatry

## 2018-08-17 VITALS — BP 149/62 | HR 144 | Temp 97.5°F | Wt 144.0 lb

## 2018-08-17 DIAGNOSIS — F411 Generalized anxiety disorder: Secondary | ICD-10-CM

## 2018-08-17 DIAGNOSIS — F331 Major depressive disorder, recurrent, moderate: Secondary | ICD-10-CM | POA: Diagnosis not present

## 2018-08-17 DIAGNOSIS — F410 Panic disorder [episodic paroxysmal anxiety] without agoraphobia: Secondary | ICD-10-CM

## 2018-08-17 MED ORDER — QUETIAPINE FUMARATE 200 MG PO TABS: 200.0000 mg | ORAL_TABLET | Freq: Every day | ORAL | 0 refills | Status: DC

## 2018-08-17 MED ORDER — QUETIAPINE FUMARATE 25 MG PO TABS: 12.5000 mg | ORAL_TABLET | Freq: Every day | ORAL | 0 refills | Status: DC | PRN

## 2018-08-17 MED ORDER — SERTRALINE HCL 100 MG PO TABS: 150.0000 mg | ORAL_TABLET | Freq: Every day | ORAL | 0 refills | Status: DC

## 2018-08-17 NOTE — Progress Notes (Signed)
[facility] MD OP Progress Note  08/17/2018 4:21 PM Shirley Alvarado  MRN:  [id number]  Chief Complaint: ' I am here for follow up." Chief Complaint    Follow-up; Medication Refill     HPI: Shirley Alvarado is a 67 year old Caucasian female, widowed, lives in [place], has a history of depression, anxiety, panic attacks, benzodiazepine dependence-prescribed, COPD, history of colon cancer, hypertension, oxygen dependent, presented to the clinic today for a follow-up visit.  Patient presented wearing a mask and a portable oxygen tank with her.  Patient today reports she is currently recovering from lung infection.  She reports she hence continues to struggle with some shortness of breath .  Patient reports she is tolerating the medications well.  She reports she continues to struggle with sleep at times.  She reports it is mostly because of her recent health issues, pain and so on.  She reports she takes her Seroquel as prescribed and is interested in a dosage increase.  Patient otherwise denies any other concerns today.  She denies any suicidality.  She denies any homicidality.  She denies any perceptual disturbances.  She reports she does not have a lot of panic symptoms at this time and is able to limit the use of Valium.  She was referred for psychotherapy sessions however has been noncompliant. Visit Diagnosis:    ICD-10-CM   1. Major depressive disorder, recurrent episode, moderate (HCC) F33.1 sertraline (ZOLOFT) 100 MG tablet    QUEtiapine (SEROQUEL) 200 MG tablet    QUEtiapine (SEROQUEL) 25 MG tablet  2. GAD (generalized anxiety disorder) F41.1 sertraline (ZOLOFT) 100 MG tablet    QUEtiapine (SEROQUEL) 200 MG tablet  3. Panic disorder F41.0 sertraline (ZOLOFT) 100 MG tablet    Past Psychiatric History: Have reviewed past psychiatric history from my progress note on 02/26/2018  Past Medical History:  Past Medical History:  Diagnosis Date  . Anxiety   . Basal cell carcinoma   . Cancer (HCC)     COLON  . Chronic pain   . COPD (chronic obstructive pulmonary disease) ([place])   . Depression   . GERD (gastroesophageal reflux disease)   . Hypertension   . Ventral hernia     Past Surgical History:  Procedure Laterality Date  . CAROTID PTA/STENT INTERVENTION Right 01/25/2018   Procedure: CAROTID PTA/STENT INTERVENTION;  Surgeon: Algernon Huxley, MD;  Location: [facility];  Service: Cardiovascular;  Laterality: Right;  . CATARACT EXTRACTION W/PHACO Left 08/04/2016   Procedure: CATARACT EXTRACTION PHACO AND INTRAOCULAR LENS PLACEMENT (IOC);  Surgeon: Ronnell Freshwater, MD;  Location: [facility];  Service: Ophthalmology;  Laterality: Left;  LEFT  . COLON SURGERY    . TUBAL LIGATION      Family Psychiatric History: Reviewed family psychiatric history from my progress note on 02/26/2018  Family History:  Family History  Problem Relation Age of Onset  . Lung cancer Mother   . Hypertension Mother   . Diabetes Mother   . Anxiety disorder Mother   . Depression Mother   . Breast cancer Mother   . Hypertension Father   . Diabetes Father   . Heart attack Father   . Depression Father   . Anxiety disorder Father   . Alcohol abuse Father   . Drug abuse Father   . Cancer Sister   . Diabetes Sister   . Hypertension Sister   . Obesity Sister   . Hypertension Brother   . Hypertension Brother   . Liver disease Brother   .  Colon cancer Brother   . Breast cancer Maternal Aunt   . Breast cancer Maternal Aunt   . Breast cancer Maternal Aunt     Social History: I have reviewed social history from my progress note on 02/26/2018. Social History   Socioeconomic History  . Marital status: Widowed    Spouse name: Not on file  . Number of children: 2  . Years of education: Not on file  . Highest education level: 11th grade  Occupational History    Comment: retired  Scientific laboratory technician  . Financial resource strain: Very hard  . Food insecurity:    Worry: Sometimes true     Inability: Sometimes true  . Transportation needs:    Medical: No    Non-medical: No  Tobacco Use  . Smoking status: Current Every Day Smoker    Packs/day: 0.50    Years: 50.00    Pack years: 25.00    Types: Cigarettes    Start date: 05/21/1970  . Smokeless tobacco: Never Used  . Tobacco comment: However patient relates that she smoked for 50 years and at one point she was up to 2 packs per day  Substance and Sexual Activity  . Alcohol use: No    Alcohol/week: 0.0 standard drinks  . Drug use: No  . Sexual activity: Not Currently  Lifestyle  . Physical activity:    Days per week: 0 days    Minutes per session: 0 min  . Stress: Only a little  Relationships  . Social connections:    Talks on phone: More than three times a week    Gets together: Once a week    Attends religious service: More than 4 times per year    Active member of club or organization: Yes    Attends meetings of clubs or organizations: More than 4 times per year    Relationship status: Widowed  Other Topics Concern  . Not on file  Social History Narrative  . Not on file    Allergies:  Allergies  Allergen Reactions  . Minocycline Anaphylaxis    Throat swelling  . Levofloxacin Hives    Metabolic Disorder Labs: No results found for: HGBA1C, MPG No results found for: PROLACTIN Lab Results  Component Value Date   CHOL 172 08/29/2013   TRIG 153 08/29/2013   HDL 65 (H) 08/29/2013   VLDL 31 08/29/2013   LDLCALC 76 08/29/2013   No results found for: TSH  Therapeutic Level Labs: No results found for: LITHIUM No results found for: VALPROATE No components found for:  CBMZ  Current Medications: Current Outpatient Medications  Medication Sig Dispense Refill  . albuterol (PROAIR HFA) 108 (90 BASE) MCG/ACT inhaler Inhale 2 puffs into the lungs every 6 (six) hours as needed.     Marland Kitchen amLODipine (NORVASC) 10 MG tablet Take 10 mg by mouth daily.     Marland Kitchen aspirin EC 81 MG tablet Take 81 mg by mouth daily.    Marland Kitchen  atorvastatin (LIPITOR) 10 MG tablet TAKE 1 TABLET BY MOUTH EVERY DAY 90 tablet 4  . clopidogrel (PLAVIX) 75 MG tablet Take 1 tablet (75 mg total) by mouth daily. 30 tablet 5  . diazepam (VALIUM) 5 MG tablet Take 0.5 tablets (2.5 mg total) by mouth 2 (two) times daily as needed for anxiety (only for severe anxiety sx). 24 tablet 1  . fentaNYL (DURAGESIC - DOSED MCG/HR) 25 MCG/HR patch Place 25 mcg onto the skin every 3 (three) days.    . Fluticasone-Salmeterol (  ADVAIR DISKUS) 250-50 MCG/DOSE AEPB Inhale 1 puff into the lungs 2 (two) times daily.     Marland Kitchen loratadine (CLARITIN) 10 MG tablet Take 10 mg by mouth daily.     Marland Kitchen losartan-hydrochlorothiazide (HYZAAR) 100-25 MG per tablet TAKE 1 TABLET BY MOUTH DAILY.    . meloxicam (MOBIC) 15 MG tablet Take 15 mg by mouth daily.    . mometasone (NASONEX) 50 MCG/ACT nasal spray Place 1 spray into the nose daily.    . montelukast (SINGULAIR) 10 MG tablet TAKE 1 TABLET BY MOUTH DAILY    . naloxone (NARCAN) nasal spray 4 mg/0.1 mL 4 mg (1 nasal spray) prn for opioid overdose; repeat every 2 to 3 minutes in alternating nostrils until medical assistance available    . oxyCODONE (ROXICODONE) 15 MG immediate release tablet Take 15 mg by mouth every 6 (six) hours as needed for pain.     . predniSONE (DELTASONE) 10 MG tablet TAKE 4 PILLS FOR 2 DAYS, 3 PILLS FOR 2 DAYS, 2 PILLS FOR 2 DAYS, 1 PILL FOR 2 DAYS.  0  . QUEtiapine (SEROQUEL) 25 MG tablet Take 0.5-1 tablets (12.5-25 mg total) by mouth daily as needed. Only for severe agitation, anxiety sx 90 tablet 0  . ranitidine (ZANTAC) 150 MG capsule Take 150 mg by mouth 2 (two) times daily.     . sertraline (ZOLOFT) 100 MG tablet Take 1.5 tablets (150 mg total) by mouth daily. 135 tablet 0  . sulfamethoxazole-trimethoprim (BACTRIM DS,SEPTRA DS) 800-160 MG tablet Take 2 tablets by mouth 2 (two) times daily. 40 tablet 0  . theophylline (UNIPHYL) 400 MG 24 hr tablet Take 400 mg by mouth daily.    Marland Kitchen tiotropium (SPIRIVA  HANDIHALER) 18 MCG inhalation capsule Place 18 mcg into inhaler and inhale daily.     Marland Kitchen umeclidinium-vilanterol (ANORO ELLIPTA) 62.5-25 MCG/INH AEPB Inhale 1 puff into the lungs daily.    Marland Kitchen liothyronine (CYTOMEL) 50 MCG tablet Take 50 mcg by mouth daily.     . QUEtiapine (SEROQUEL) 200 MG tablet Take 1 tablet (200 mg total) by mouth at bedtime. 90 tablet 0   No current facility-administered medications for this visit.      Musculoskeletal: Strength & Muscle Tone: within normal limits Gait & Station: normal Patient leans: N/A  Psychiatric Specialty Exam: Review of Systems  Psychiatric/Behavioral: The patient is nervous/anxious and has insomnia.   All other systems reviewed and are negative.   Blood pressure (!) 149/62, pulse (!) 144, temperature (!) 97.5 F (36.4 C), temperature source Oral, weight 144 lb (65.3 kg), SpO2 92 %.Body mass index is 25.51 kg/m.  General Appearance: Casual  Eye Contact:  Fair  Speech:  Clear and Coherent  Volume:  Normal  Mood:  Anxious and Dysphoric  Affect:  Congruent  Thought Process:  Goal Directed and Descriptions of Associations: Intact  Orientation:  Full (Time, Place, and Person)  Thought Content: Logical   Suicidal Thoughts:  No  Homicidal Thoughts:  No  Memory:  Immediate;   Fair Recent;   Fair Remote;   Fair  Judgement:  Fair  Insight:  Fair  Psychomotor Activity:  Normal  Concentration:  Concentration: Fair and Attention Span: Fair  Recall:  [employer] of Knowledge: Fair  Language: Fair  Akathisia:  No  Handed:  Right  AIMS (if indicated): denies tremors, rigidity,stiffness  Assets:  Communication Skills Desire for Improvement Social Support  ADL's:  Intact  Cognition: WNL  Sleep:  Poor   Screenings:  Assessment and Plan: Ruther is a 67 year old Caucasian female who has a history of depression, anxiety, panic attack, benzodiazepine dependence, COPD, oxygen dependent, hypertension, chronic pain, history of colon cancer,  presented to the clinic today for a follow-up visit.  Patient continues to struggle with some sleep problems.  She also has multiple health problems including chronic hyponatremia which was noted in her recent labs.  Patient was advised to get repeat labs last visit however she has not done so yet.  Discussed the following plans with patient.  Plan MDD Continue Zoloft as prescribed Increase Seroquel to 200 mg p.o. nightly She also has Seroquel 12.5-25 mg p.o. daily as needed for severe anxiety symptoms. She was referred for psychotherapy sessions however has been noncompliant.  For anxiety symptoms Patient advised to limit the use of Valium. Continue Zoloft and Seroquel as prescribed  Discussed with patient to reach out to her primary medical doctor to get her labs done-BMP.  She was provided a lab slip last visit.  She however did not follow through with recommendations.  Discussed with her the effect of Zoloft on her sodium level.  She does have a chronic history of hyponatremia.  She however has been on Zoloft for a long time now.  We will continue to monitor closely.  And also with elevated heart rate/tachycardia-likely due to being oxygen dependent, COPD.  Patient advised to follow-up with her primary medical doctor.   Follow-up in clinic in 1-2 months or sooner if needed.  More than 50 % of the time was spent for psychoeducation and supportive psychotherapy and care coordination.  This note was generated in part or whole with voice recognition software. Voice recognition is usually quite accurate but there are transcription errors that can and very often do occur. I apologize for any typographical errors that were not detected and corrected.       Ursula Alert, MD 08/17/2018, 4:21 PM

## 2018-08-26 ENCOUNTER — Telehealth: Payer: Self-pay

## 2018-08-26 ENCOUNTER — Other Ambulatory Visit: Payer: Self-pay | Admitting: Psychiatry

## 2018-08-26 DIAGNOSIS — F410 Panic disorder [episodic paroxysmal anxiety] without agoraphobia: Secondary | ICD-10-CM

## 2018-08-26 DIAGNOSIS — F411 Generalized anxiety disorder: Secondary | ICD-10-CM

## 2018-08-26 NOTE — Telephone Encounter (Signed)
Ok great.

## 2018-08-26 NOTE — Telephone Encounter (Signed)
pt called left message that she had labwork done today in [place] when she gets the result on her mychart she will make sure that you get a copy.

## 2018-08-31 ENCOUNTER — Telehealth: Payer: Self-pay | Admitting: Psychiatry

## 2018-08-31 NOTE — Telephone Encounter (Signed)
pt called left message that she received her labwork on her mychart and that she also not sleeping at night

## 2018-08-31 NOTE — Telephone Encounter (Signed)
Called pt, left message.

## 2018-08-31 NOTE — Telephone Encounter (Signed)
Attempted to call pt - left message

## 2018-09-01 MED ORDER — QUETIAPINE FUMARATE 200 MG PO TABS: 200.0000 mg | ORAL_TABLET | Freq: Every day | ORAL | 0 refills | Status: DC

## 2018-09-01 MED ORDER — QUETIAPINE FUMARATE 50 MG PO TABS: 50.0000 mg | ORAL_TABLET | Freq: Every day | ORAL | 0 refills | Status: DC

## 2018-09-01 NOTE — Telephone Encounter (Signed)
pt called states she wanted to speak with you about her labwork and she also needs something to help her sleep

## 2018-09-01 NOTE — Telephone Encounter (Signed)
Spoke to patient. Sent new script for 250 mg to [facility] [place]

## 2018-09-16 ENCOUNTER — Other Ambulatory Visit (INDEPENDENT_AMBULATORY_CARE_PROVIDER_SITE_OTHER): Payer: Self-pay | Admitting: Vascular Surgery

## 2018-09-22 ENCOUNTER — Telehealth: Payer: Self-pay

## 2018-09-22 NOTE — Telephone Encounter (Signed)
pt called states she is not sleeping. can you call in something to help

## 2018-09-23 NOTE — Telephone Encounter (Signed)
Called patient, she ahs not yet started 250 mg at bedtime of seroquel. Advised to do so. She will pick up script sent to pharmacy 11/127/2019.

## 2018-10-14 ENCOUNTER — Other Ambulatory Visit: Payer: Self-pay

## 2018-10-14 ENCOUNTER — Encounter: Payer: Self-pay | Admitting: Psychiatry

## 2018-10-14 ENCOUNTER — Ambulatory Visit (INDEPENDENT_AMBULATORY_CARE_PROVIDER_SITE_OTHER): Payer: Medicare Other | Admitting: Psychiatry

## 2018-10-14 VITALS — BP 169/83 | HR 111 | Temp 97.6°F | Wt 142.6 lb

## 2018-10-14 DIAGNOSIS — F410 Panic disorder [episodic paroxysmal anxiety] without agoraphobia: Secondary | ICD-10-CM | POA: Diagnosis not present

## 2018-10-14 DIAGNOSIS — F411 Generalized anxiety disorder: Secondary | ICD-10-CM | POA: Diagnosis not present

## 2018-10-14 DIAGNOSIS — F331 Major depressive disorder, recurrent, moderate: Secondary | ICD-10-CM | POA: Diagnosis not present

## 2018-10-14 MED ORDER — QUETIAPINE FUMARATE 200 MG PO TABS: 200.0000 mg | ORAL_TABLET | Freq: Every day | ORAL | 0 refills | Status: DC

## 2018-10-14 MED ORDER — QUETIAPINE FUMARATE 50 MG PO TABS: 50.0000 mg | ORAL_TABLET | Freq: Every day | ORAL | 0 refills | Status: DC

## 2018-10-14 MED ORDER — HYDROXYZINE PAMOATE 25 MG PO CAPS: 25.0000 mg | ORAL_CAPSULE | Freq: Three times a day (TID) | ORAL | 1 refills | Status: DC | PRN

## 2018-10-14 MED ORDER — SERTRALINE HCL 100 MG PO TABS: 100.0000 mg | ORAL_TABLET | Freq: Every day | ORAL | 0 refills | Status: DC

## 2018-10-14 NOTE — Patient Instructions (Addendum)
Start taking Seroquel ( Quetiapine) 250 mg at bedtime.  Start taking a lower dose of Zoloft - 100 mg po daily.  Start taking hydroxyzine 25 mg three times a day as needed for severe anxiety symptoms.  Please check your sodium level as instructed by PMD and bring your results when you return.

## 2018-10-14 NOTE — Progress Notes (Signed)
[facility] MD OP Progress Note  10/14/2018 2:23 PM Shirley Alvarado  MRN:  [id number]  Chief Complaint: 'I am here for follow-up. Chief Complaint    Follow-up     HPI: Shirley Alvarado is a 68 year old Caucasian female, widowed, lives in [place], has a history of depression, anxiety, panic attacks, COPD, history of lung nodule, chronic hyponatremia, history of colon cancer, hypertension, oxygen dependent presented to the clinic today for a follow-up visit.  Patient today presented along with the portable oxygen tank with her.  Patient reports she continues to have anxiety attacks on and off.  She reports because of her difficulty to breathe due to her multiple medical problems she is afraid about going to places.  She reports she goes into a panic mode when she has to get in the car to go places.  She reports she is not taking the Valium anymore .  She is compliant on the Zoloft.  Patient denies any significant sadness, crying spells.  She reports she however continues to struggle with sleep.  Patient was advised to increase her Seroquel through telephone call multiple times previously.  Patient however today reports she has not been able to do so yet.  She however agrees that she will do it tonight.  Patient with chronic hyponatremia which is currently being managed by her primary medical doctor.  Her most recent sodium level dated 08/11/2018 was 128.  Her provider has made changes with her diuretic medication.  She has been advised to go back for labs in 2 weeks.  Discussed with patient that her hyponatremia is chronic and likely is not secondary to medications like Zoloft.  However will readjust the dosage of Zoloft today.  Patient agrees with reducing the dosage to 100 mg.  Patient denies any suicidality.  She has good social support from her sister and they live together. Visit Diagnosis:    ICD-10-CM   1. Major depressive disorder, recurrent episode, moderate (HCC) F33.1 sertraline (ZOLOFT) 100 MG tablet     QUEtiapine (SEROQUEL) 200 MG tablet    QUEtiapine (SEROQUEL) 50 MG tablet  2. GAD (generalized anxiety disorder) F41.1 sertraline (ZOLOFT) 100 MG tablet  3. Panic disorder F41.0 sertraline (ZOLOFT) 100 MG tablet    Past Psychiatric History: I have reviewed past psychiatric history from my progress note on 02/26/2018.  Past Medical History:  Past Medical History:  Diagnosis Date  . Anxiety   . Basal cell carcinoma   . Cancer (HCC)    COLON  . Chronic pain   . COPD (chronic obstructive pulmonary disease) ([place])   . Depression   . GERD (gastroesophageal reflux disease)   . Hypertension   . Ventral hernia     Past Surgical History:  Procedure Laterality Date  . CAROTID PTA/STENT INTERVENTION Right 01/25/2018   Procedure: CAROTID PTA/STENT INTERVENTION;  Surgeon: Algernon Huxley, MD;  Location: [facility];  Service: Cardiovascular;  Laterality: Right;  . CATARACT EXTRACTION W/PHACO Left 08/04/2016   Procedure: CATARACT EXTRACTION PHACO AND INTRAOCULAR LENS PLACEMENT (IOC);  Surgeon: Ronnell Freshwater, MD;  Location: [facility];  Service: Ophthalmology;  Laterality: Left;  LEFT  . COLON SURGERY    . TUBAL LIGATION      Family Psychiatric History: Reviewed family psychiatric history from my progress note on 02/26/2018  Family History:  Family History  Problem Relation Age of Onset  . Lung cancer Mother   . Hypertension Mother   . Diabetes Mother   . Anxiety disorder Mother   .  Depression Mother   . Breast cancer Mother   . Hypertension Father   . Diabetes Father   . Heart attack Father   . Depression Father   . Anxiety disorder Father   . Alcohol abuse Father   . Drug abuse Father   . Cancer Sister   . Diabetes Sister   . Hypertension Sister   . Obesity Sister   . Hypertension Brother   . Hypertension Brother   . Liver disease Brother   . Colon cancer Brother   . Breast cancer Maternal Aunt   . Breast cancer Maternal Aunt   . Breast cancer  Maternal Aunt     Social History: Reviewed social history from my progress note on 02/26/2018. Social History   Socioeconomic History  . Marital status: Widowed    Spouse name: Not on file  . Number of children: 2  . Years of education: Not on file  . Highest education level: 11th grade  Occupational History    Comment: retired  Scientific laboratory technician  . Financial resource strain: Very hard  . Food insecurity:    Worry: Sometimes true    Inability: Sometimes true  . Transportation needs:    Medical: No    Non-medical: No  Tobacco Use  . Smoking status: Current Every Day Smoker    Packs/day: 0.50    Years: 50.00    Pack years: 25.00    Types: Cigarettes    Start date: 05/21/1970  . Smokeless tobacco: Never Used  . Tobacco comment: However patient relates that she smoked for 50 years and at one point she was up to 2 packs per day  Substance and Sexual Activity  . Alcohol use: No    Alcohol/week: 0.0 standard drinks  . Drug use: No  . Sexual activity: Not Currently  Lifestyle  . Physical activity:    Days per week: 0 days    Minutes per session: 0 min  . Stress: Only a little  Relationships  . Social connections:    Talks on phone: More than three times a week    Gets together: Once a week    Attends religious service: More than 4 times per year    Active member of club or organization: Yes    Attends meetings of clubs or organizations: More than 4 times per year    Relationship status: Widowed  Other Topics Concern  . Not on file  Social History Narrative  . Not on file    Allergies:  Allergies  Allergen Reactions  . Minocycline Anaphylaxis    Throat swelling  . Levofloxacin Hives    Metabolic Disorder Labs: No results found for: HGBA1C, MPG No results found for: PROLACTIN Lab Results  Component Value Date   CHOL 172 08/29/2013   TRIG 153 08/29/2013   HDL 65 (H) 08/29/2013   VLDL 31 08/29/2013   LDLCALC 76 08/29/2013   No results found for:  TSH  Therapeutic Level Labs: No results found for: LITHIUM No results found for: VALPROATE No components found for:  CBMZ  Current Medications: Current Outpatient Medications  Medication Sig Dispense Refill  . albuterol (PROAIR HFA) 108 (90 BASE) MCG/ACT inhaler Inhale 2 puffs into the lungs every 6 (six) hours as needed.     Marland Kitchen amLODipine (NORVASC) 10 MG tablet Take 10 mg by mouth daily.     Marland Kitchen aspirin EC 81 MG tablet Take 81 mg by mouth daily.    Marland Kitchen atorvastatin (LIPITOR) 10 MG tablet TAKE  1 TABLET BY MOUTH EVERY DAY 90 tablet 4  . clopidogrel (PLAVIX) 75 MG tablet Take 2 tablets (150 mg total) by mouth once for 1 dose. 2 tablet 0  . clopidogrel (PLAVIX) 75 MG tablet TAKE 1 TABLET BY MOUTH EVERY DAY 90 tablet 3  . fentaNYL (DURAGESIC - DOSED MCG/HR) 25 MCG/HR patch Place 25 mcg onto the skin every 3 (three) days.    . Fluticasone-Salmeterol (ADVAIR DISKUS) 250-50 MCG/DOSE AEPB Inhale 1 puff into the lungs 2 (two) times daily.     . hydrochlorothiazide (HYDRODIURIL) 25 MG tablet Take by mouth.    . loratadine (CLARITIN) 10 MG tablet Take 10 mg by mouth daily.     Marland Kitchen losartan (COZAAR) 100 MG tablet Take by mouth.    . meloxicam (MOBIC) 15 MG tablet Take 15 mg by mouth daily.    . mometasone (NASONEX) 50 MCG/ACT nasal spray Place 1 spray into the nose daily.    . montelukast (SINGULAIR) 10 MG tablet TAKE 1 TABLET BY MOUTH DAILY    . naloxone (NARCAN) nasal spray 4 mg/0.1 mL 4 mg (1 nasal spray) prn for opioid overdose; repeat every 2 to 3 minutes in alternating nostrils until medical assistance available    . nystatin cream (MYCOSTATIN) Apply topically.    Marland Kitchen oxyCODONE (ROXICODONE) 15 MG immediate release tablet Take 15 mg by mouth every 6 (six) hours as needed for pain.     . predniSONE (DELTASONE) 10 MG tablet TAKE 4 PILLS FOR 2 DAYS, 3 PILLS FOR 2 DAYS, 2 PILLS FOR 2 DAYS, 1 PILL FOR 2 DAYS.  0  . QUEtiapine (SEROQUEL) 200 MG tablet Take 1 tablet (200 mg total) by mouth at bedtime. To be  combined with 50 mg 90 tablet 0  . QUEtiapine (SEROQUEL) 25 MG tablet Take 0.5-1 tablets (12.5-25 mg total) by mouth daily as needed. Only for severe agitation, anxiety sx 90 tablet 0  . QUEtiapine (SEROQUEL) 50 MG tablet Take 1 tablet (50 mg total) by mouth at bedtime. To be combined with 200 mg 90 tablet 0  . ranitidine (ZANTAC) 150 MG capsule Take 150 mg by mouth 2 (two) times daily.     . sertraline (ZOLOFT) 100 MG tablet Take 1 tablet (100 mg total) by mouth daily. 90 tablet 0  . sulfamethoxazole-trimethoprim (BACTRIM DS,SEPTRA DS) 800-160 MG tablet Take 2 tablets by mouth 2 (two) times daily. 40 tablet 0  . theophylline (UNIPHYL) 400 MG 24 hr tablet Take 400 mg by mouth daily.    Marland Kitchen tiotropium (SPIRIVA HANDIHALER) 18 MCG inhalation capsule Place 18 mcg into inhaler and inhale daily.     Marland Kitchen umeclidinium-vilanterol (ANORO ELLIPTA) 62.5-25 MCG/INH AEPB Inhale 1 puff into the lungs daily.    Marland Kitchen amoxicillin-clavulanate (AUGMENTIN) 875-125 MG tablet     . hydrOXYzine (VISTARIL) 25 MG capsule Take 1 capsule (25 mg total) by mouth 3 (three) times daily as needed (for severe anxiety symptoms). 270 capsule 1  . liothyronine (CYTOMEL) 50 MCG tablet Take 50 mcg by mouth daily.     Marland Kitchen liothyronine (CYTOMEL) 50 MCG tablet Take 50 mcg by mouth daily.  3   No current facility-administered medications for this visit.      Musculoskeletal: Strength & Muscle Tone: within normal limits Gait & Station: normal Patient leans: N/A  Psychiatric Specialty Exam: Review of Systems  Psychiatric/Behavioral: The patient is nervous/anxious.   All other systems reviewed and are negative.   Blood pressure (!) 169/83, pulse (!) 111, temperature 97.6 F (  36.4 C), temperature source Oral, weight 142 lb 9.6 oz (64.7 kg), SpO2 91 %.Body mass index is 25.26 kg/m.  General Appearance: Casual  Eye Contact:  Fair  Speech:  Normal Rate  Volume:  Normal  Mood:  Anxious  Affect:  Appropriate  Thought Process:  Goal  Directed and Descriptions of Associations: Intact  Orientation:  Full (Time, Place, and Person)  Thought Content: Logical   Suicidal Thoughts:  No  Homicidal Thoughts:  No  Memory:  Immediate;   Fair Recent;   Fair Remote;   Fair  Judgement:  Fair  Insight:  Fair  Psychomotor Activity:  Normal  Concentration:  Concentration: Fair and Attention Span: Fair  Recall:  [employer] of Knowledge: Fair  Language: Fair  Akathisia:  No  Handed:  Right  AIMS (if indicated): denies tremors, rigidity  Assets:  Communication Skills Desire for Improvement Social Support  ADL's:  Intact  Cognition: WNL  Sleep:  restless   Screenings:   Assessment and Plan: Delisia is a 68 year old Caucasian female who has a history of depression, anxiety, panic attacks, COPD, hypertension, chronic pain, history of colon cancer, presented to the clinic today for a follow-up visit.  Patient continues to struggle with some sleep problems.  She has chronic hyponatremia however it is being managed by her primary medical doctor.  She has upcoming labs scheduled for follow-up.  Discussed the following medication changes with patient today.  Plan MDD-stable Reduce Zoloft to 100 mg p.o. daily Continue Seroquel as prescribed.  For anxiety-unstable Add hydroxyzine 25 mg p.o. 3 times daily as needed Patient is no longer on the Valium. Will increase Seroquel to 250 mg p.o. nightly. Patient with hyponatremia on recent labs dated 08/11/2018- hence will reduce her Zoloft.  Patient however has chronic hyponatremia which is likely not secondary to her SSRIs.  For insomnia-unstable Increase Seroquel to 200 mg p.o. nightly   Patient advised to follow-up in clinic in 1 to 2 months or sooner if needed.  I have spent atleast 15 minutes face to face with patient today. More than 50 % of the time was spent for psychoeducation and supportive psychotherapy and care coordination.  This note was generated in part or whole with voice  recognition software. Voice recognition is usually quite accurate but there are transcription errors that can and very often do occur. I apologize for any typographical errors that were not detected and corrected.        Ursula Alert, MD 10/14/2018, 2:23 PM

## 2018-10-18 ENCOUNTER — Telehealth: Payer: Self-pay

## 2018-10-18 NOTE — Telephone Encounter (Signed)
pt called left message that she can not take the hydroxyzine that it makes her feel like she on speed.... states she can not sleep. needs something else

## 2018-10-18 NOTE — Telephone Encounter (Signed)
Hi Shirley Alvarado, please call patient and let her know her seroquel dosage was increased for sleep this visit.this was discussed with her several times.  She does not have to take hydroxyzine.  thanks

## 2018-10-19 NOTE — Telephone Encounter (Signed)
Patient called back .  Pt was explained dr. Shea Evans instruction

## 2018-11-14 ENCOUNTER — Other Ambulatory Visit: Payer: Self-pay | Admitting: Psychiatry

## 2018-11-14 DIAGNOSIS — F331 Major depressive disorder, recurrent, moderate: Secondary | ICD-10-CM

## 2018-11-14 DIAGNOSIS — F411 Generalized anxiety disorder: Secondary | ICD-10-CM

## 2018-11-14 DIAGNOSIS — F410 Panic disorder [episodic paroxysmal anxiety] without agoraphobia: Secondary | ICD-10-CM

## 2018-11-15 NOTE — Telephone Encounter (Signed)
zoloft available at pharmacy

## 2018-11-18 ENCOUNTER — Encounter (HOSPITAL_COMMUNITY): Payer: Self-pay | Admitting: Student

## 2018-11-18 ENCOUNTER — Emergency Department (HOSPITAL_COMMUNITY)
Admission: EM | Admit: 2018-11-18 | Discharge: 2018-11-18 | Disposition: A | Discharge status: Home / Self Care | Payer: Medicare Other | Attending: Emergency Medicine | Admitting: Emergency Medicine

## 2018-11-18 DIAGNOSIS — F17210 Nicotine dependence, cigarettes, uncomplicated: Secondary | ICD-10-CM | POA: Diagnosis not present

## 2018-11-18 DIAGNOSIS — T148XXA Other injury of unspecified body region, initial encounter: Secondary | ICD-10-CM

## 2018-11-18 DIAGNOSIS — Z79899 Other long term (current) drug therapy: Secondary | ICD-10-CM | POA: Diagnosis not present

## 2018-11-18 DIAGNOSIS — Z7902 Long term (current) use of antithrombotics/antiplatelets: Secondary | ICD-10-CM | POA: Insufficient documentation

## 2018-11-18 DIAGNOSIS — J449 Chronic obstructive pulmonary disease, unspecified: Secondary | ICD-10-CM | POA: Diagnosis not present

## 2018-11-18 DIAGNOSIS — I10 Essential (primary) hypertension: Secondary | ICD-10-CM | POA: Diagnosis not present

## 2018-11-18 DIAGNOSIS — L7622 Postprocedural hemorrhage and hematoma of skin and subcutaneous tissue following other procedure: Secondary | ICD-10-CM | POA: Insufficient documentation

## 2018-11-18 DIAGNOSIS — Z85828 Personal history of other malignant neoplasm of skin: Secondary | ICD-10-CM | POA: Insufficient documentation

## 2018-11-18 NOTE — ED Notes (Signed)
Per patient, they do not have a portable oxygen tank with her. Have notified patient how unsafe this is and she needs to have one at ALL times. States she understands, but she is going home with her sister.

## 2018-11-18 NOTE — ED Notes (Signed)
Per charge nurse, she is able to take a tank home. Have given her a tank to take home and her sister states she will bring it back tomorrow.

## 2018-11-18 NOTE — Discharge Instructions (Addendum)
Avoid picking at sutures.  If bleeding restarts, hold direct firm pressure for 15 minutes.  If you continue to have bleeding return immediately to the emergency department.

## 2018-11-18 NOTE — ED Provider Notes (Signed)
[facility] EMERGENCY DEPARTMENT Provider Note   CSN: [id number] Arrival date & time: 11/18/18  1610     History   Chief Complaint Chief Complaint  Patient presents with  . Epistaxis    HPI Shirley Alvarado is a 68 y.o. female.  HPI Patient had basal cell carcinoma removed from her nose by a dermatologist in during this morning.  States she was scratching at an area on the nose and started bleeding.  Denies any other symptoms.  No lightheadedness or syncope.  Does admit to drinking several beers prior to coming to the emergency department. Past Medical History:  Diagnosis Date  . Anxiety   . Basal cell carcinoma   . Cancer (HCC)    COLON  . Chronic pain   . COPD (chronic obstructive pulmonary disease) ([place])   . Depression   . GERD (gastroesophageal reflux disease)   . Hypertension   . Ventral hernia     Patient Active Problem List   Diagnosis Date Noted  . Carotid stenosis, right 01/25/2018  . Carotid stenosis 01/08/2018  . LBP (low back pain) 12/24/2015  . Infected hernioplasty mesh ([place])   . Pulmonary emphysema ([place])   . Other long term (current) drug therapy 07/06/2015  . Long term current use of opiate analgesic 07/06/2015  . 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 Foot pain 10/12/2014  . Plantar verruca 10/12/2014  . Continuous opioid dependence ([place]) 06/09/2014  . Advance directive discussed with patient 04/11/2013  . 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 Adult BMI 30+ 11/23/2012  . Lung mass 11/23/2012  . Compulsive tobacco user syndrome 11/23/2012  . Current tobacco use 11/23/2012  . Chronic pain 04/27/2012  . Cancer of colon ([place]) 07/29/2011  . CAFL (chronic airflow limitation) ([place]) 07/29/2011  . Clinical depression 07/29/2011  . BP (high blood pressure) 07/29/2011  . Hernia of anterior abdominal wall 07/29/2011  . Malignant neoplasm  of colon ([place]) 07/29/2011  . Chronic obstructive pulmonary disease ([place]) 07/29/2011    Past Surgical History:  Procedure Laterality Date  . CAROTID PTA/STENT INTERVENTION Right 01/25/2018   Procedure: CAROTID PTA/STENT INTERVENTION;  Surgeon: Algernon Huxley, MD;  Location: [facility];  Service: Cardiovascular;  Laterality: Right;  . CATARACT EXTRACTION W/PHACO Left 08/04/2016   Procedure: CATARACT EXTRACTION PHACO AND INTRAOCULAR LENS PLACEMENT (IOC);  Surgeon: Ronnell Freshwater, MD;  Location: [facility];  Service: Ophthalmology;  Laterality: Left;  LEFT  . COLON SURGERY    . TUBAL LIGATION       OB History   No obstetric history on file.      Home Medications    Prior to Admission medications   Medication Sig Start Date End Date Taking? Authorizing Provider  albuterol (PROAIR HFA) 108 (90 BASE) MCG/ACT inhaler Inhale 2 puffs into the lungs every 6 (six) hours as needed.  03/23/15   [provider]  amLODipine (NORVASC) 10 MG tablet Take 10 mg by mouth daily.  09/11/14   [provider]  amoxicillin-clavulanate (AUGMENTIN) 875-125 MG tablet  08/14/18   [provider]  aspirin EC 81 MG tablet Take 81 mg by mouth daily.    [provider]  atorvastatin (LIPITOR) 10 MG tablet TAKE 1 TABLET BY MOUTH EVERY DAY 07/19/18   Algernon Huxley, MD  clopidogrel (PLAVIX) 75 MG tablet Take 2 tablets (150 mg total) by mouth once for  1 dose. 01/26/18 09/15/24  Algernon Huxley, MD  clopidogrel (PLAVIX) 75 MG tablet TAKE 1 TABLET BY MOUTH EVERY DAY 09/16/18   Algernon Huxley, MD  fentaNYL (DURAGESIC - DOSED MCG/HR) 25 MCG/HR patch Place 25 mcg onto the skin every 3 (three) days.    [provider]  Fluticasone-Salmeterol (ADVAIR DISKUS) 250-50 MCG/DOSE AEPB Inhale 1 puff into the lungs 2 (two) times daily.  03/23/15   [provider]  hydrochlorothiazide (HYDRODIURIL) 25 MG tablet Take by mouth. 09/21/18 09/21/19  [provider]   hydrOXYzine (VISTARIL) 25 MG capsule Take 1 capsule (25 mg total) by mouth 3 (three) times daily as needed (for severe anxiety symptoms). 10/14/18   Ursula Alert, MD  liothyronine (CYTOMEL) 50 MCG tablet Take 50 mcg by mouth daily.  01/30/15 01/25/18  [provider]  liothyronine (CYTOMEL) 50 MCG tablet Take 50 mcg by mouth daily. 07/23/18   [provider]  loratadine (CLARITIN) 10 MG tablet Take 10 mg by mouth daily.  01/30/15   [provider]  losartan (COZAAR) 100 MG tablet Take by mouth. 09/21/18 09/21/19  [provider]  meloxicam (MOBIC) 15 MG tablet Take 15 mg by mouth daily. 03/11/17   [provider]  mometasone (NASONEX) 50 MCG/ACT nasal spray Place 1 spray into the nose daily. 03/01/17   [provider]  montelukast (SINGULAIR) 10 MG tablet TAKE 1 TABLET BY MOUTH DAILY 04/12/15   [provider]  naloxone [facility]) nasal spray 4 mg/0.1 mL 4 mg (1 nasal spray) prn for opioid overdose; repeat every 2 to 3 minutes in alternating nostrils until medical assistance available 03/16/18   [provider]  nystatin cream (MYCOSTATIN) Apply topically. 08/24/18 08/24/19  [provider]  oxyCODONE (ROXICODONE) 15 MG immediate release tablet Take 15 mg by mouth every 6 (six) hours as needed for pain.  03/15/17   [provider]  predniSONE (DELTASONE) 10 MG tablet TAKE 4 PILLS FOR 2 DAYS, 3 PILLS FOR 2 DAYS, 2 PILLS FOR 2 DAYS, 1 PILL FOR 2 DAYS. 03/29/18   [provider]  QUEtiapine (SEROQUEL) 200 MG tablet Take 1 tablet (200 mg total) by mouth at bedtime. To be combined with 50 mg 10/14/18   Ursula Alert, MD  QUEtiapine (SEROQUEL) 25 MG tablet Take 0.5-1 tablets (12.5-25 mg total) by mouth daily as needed. Only for severe agitation, anxiety sx 08/17/18   Ursula Alert, MD  QUEtiapine (SEROQUEL) 50 MG tablet Take 1 tablet (50 mg total) by mouth at bedtime. To be combined with 200 mg 10/14/18   Ursula Alert, MD  ranitidine (ZANTAC) 150 MG capsule Take 150 mg by mouth 2 (two) times daily.  03/26/15   [provider]  sertraline (ZOLOFT) 100 MG tablet Take 1 tablet (100 mg total) by mouth daily. 10/14/18   Ursula Alert, MD  sulfamethoxazole-trimethoprim (BACTRIM DS,SEPTRA DS) 800-160 MG tablet Take 2 tablets by mouth 2 (two) times daily. 03/24/17   Schaevitz, Randall An, MD  theophylline (UNIPHYL) 400 MG 24 hr tablet Take 400 mg by mouth daily. 02/12/17   [provider]  tiotropium (SPIRIVA HANDIHALER) 18 MCG inhalation capsule Place 18 mcg into inhaler and inhale daily.  03/23/15   [provider]  umeclidinium-vilanterol (ANORO ELLIPTA) 62.5-25 MCG/INH AEPB Inhale 1 puff into the lungs daily.    [provider]    Family History Family History  Problem Relation Age of Onset  . Lung cancer Mother   . Hypertension Mother   .  Diabetes Mother   . Anxiety disorder Mother   . Depression Mother   . Breast cancer Mother   . Hypertension Father   . Diabetes Father   . Heart attack Father   . Depression Father   . Anxiety disorder Father   . Alcohol abuse Father   . Drug abuse Father   . Cancer Sister   . Diabetes Sister   . Hypertension Sister   . Obesity Sister   . Hypertension Brother   . Hypertension Brother   . Liver disease Brother   . Colon cancer Brother   . Breast cancer Maternal Aunt   . Breast cancer Maternal Aunt   . Breast cancer Maternal Aunt     Social History Social History   Tobacco Use  . Smoking status: Current Every Day Smoker    Packs/day: 0.50    Years: 50.00    Pack years: 25.00    Types: Cigarettes    Start date: 05/21/1970  . Smokeless tobacco: Never Used  . Tobacco comment: However patient relates that she smoked for 50 years and at one point she was up to 2 packs per day  Substance Use Topics  . Alcohol use: Yes    Alcohol/week: 2.0 standard drinks    Types: 2 Cans of beer per week  . Drug use: No      Allergies   Minocycline and Levofloxacin   Review of Systems Review of Systems  HENT: Negative for trouble swallowing.   Gastrointestinal: Negative for nausea and vomiting.  Musculoskeletal: Negative for neck pain.  Skin: Positive for wound.  Neurological: Negative for dizziness, weakness, light-headedness, numbness and headaches.  All other systems reviewed and are negative.    Physical Exam Updated Vital Signs Pulse (!) 106   Ht 5\' 3"  (1.6 m)   Wt 64 kg   BMI 24.98 kg/m   Physical Exam Vitals signs and nursing note reviewed.  Constitutional:      Appearance: Normal appearance. She is well-developed.  HENT:     Head: Normocephalic and atraumatic.     Comments: Patient has surgical wound with stitches in place across the tip of the nose.  At the distal end of the surgical wound just superior to the left ala, there is an area of oozing of dark blood.    Mouth/Throat:     Mouth: Mucous membranes are moist.  Eyes:     Pupils: Pupils are equal, round, and reactive to light.  Neck:     Musculoskeletal: Normal range of motion and neck supple.  Cardiovascular:     Rate and Rhythm: Normal rate and regular rhythm.  Pulmonary:     Effort: Pulmonary effort is normal.     Breath sounds: Normal breath sounds.  Abdominal:     General: Bowel sounds are normal.     Palpations: Abdomen is soft.     Tenderness: There is no abdominal tenderness. There is no guarding or rebound.  Musculoskeletal: Normal range of motion.        General: No tenderness.  Skin:    General: Skin is warm and dry.     Findings: No erythema or rash.  Neurological:     General: No focal deficit present.     Mental Status: She is alert and oriented to person, place, and time.  Psychiatric:        Behavior: Behavior normal.      ED Treatments / Results  Labs (all labs ordered are listed, but only  abnormal results are displayed) Labs Reviewed - No data to display  EKG None  Radiology No  results found.  Procedures Procedures (including critical care time)  Medications Ordered in ED Medications - No data to display   Initial Impression / Assessment and Plan / ED Course  I have reviewed the triage vital signs and the nursing notes.  Pertinent labs & imaging results that were available during my care of the patient were reviewed by me and considered in my medical decision making (see chart for details).     We will hold direct pressure. Bleeding has resolved with direct pressure.  Observed in the emergency department with no rebleeding.  Vital signs are stable.  Return precautions given. Final Clinical Impressions(s) / ED Diagnoses   Final diagnoses:  Bleeding from wound    ED Discharge Orders    None       Julianne Rice, MD 11/18/18 1735

## 2018-11-18 NOTE — ED Triage Notes (Signed)
Pt had surgery to remove cancer on her nose, pt felt like she could not breath and scratched her nose and started to bleed and would not stop. Pt states she drank 2 beers before incident

## 2018-12-13 ENCOUNTER — Ambulatory Visit (INDEPENDENT_AMBULATORY_CARE_PROVIDER_SITE_OTHER): Payer: Medicare Other | Admitting: Psychiatry

## 2018-12-13 ENCOUNTER — Other Ambulatory Visit: Payer: Self-pay

## 2018-12-13 ENCOUNTER — Encounter: Payer: Self-pay | Admitting: Psychiatry

## 2018-12-13 VITALS — BP 95/61 | HR 106 | Temp 97.9°F | Wt 137.0 lb

## 2018-12-13 DIAGNOSIS — F411 Generalized anxiety disorder: Secondary | ICD-10-CM

## 2018-12-13 DIAGNOSIS — F1310 Sedative, hypnotic or anxiolytic abuse, uncomplicated: Secondary | ICD-10-CM

## 2018-12-13 DIAGNOSIS — F410 Panic disorder [episodic paroxysmal anxiety] without agoraphobia: Secondary | ICD-10-CM | POA: Diagnosis not present

## 2018-12-13 DIAGNOSIS — F331 Major depressive disorder, recurrent, moderate: Secondary | ICD-10-CM | POA: Diagnosis not present

## 2018-12-13 MED ORDER — ARIPIPRAZOLE 2 MG PO TABS: 2.0000 mg | ORAL_TABLET | Freq: Every day | ORAL | 1 refills | Status: DC

## 2018-12-13 NOTE — Patient Instructions (Signed)
Aripiprazole tablets What is this medicine? ARIPIPRAZOLE (ay ri PIP ray zole) is an atypical antipsychotic. It is used to treat schizophrenia and bipolar disorder, also known as manic-depression. It is also used to treat Tourette's disorder and some symptoms of autism. This medicine may also be used in combination with antidepressants to treat major depressive disorder. This medicine may be used for other purposes; ask your health care provider or pharmacist if you have questions. COMMON BRAND NAME(S): Abilify What should I tell my health care provider before I take this medicine? They need to know if you have any of these conditions: -dehydration -dementia -diabetes -heart disease -history of stroke -low blood counts, like low white cell, platelet, or red cell counts -Parkinson's disease -seizures -suicidal thoughts, plans, or attempt; a previous suicide attempt by you or a family member -an unusual or allergic reaction to aripiprazole, other medicines, foods, dyes, or preservatives -pregnant or trying to get pregnant -breast-feeding How should I use this medicine? Take this medicine by mouth with a glass of water. Follow the directions on the prescription label. You can take this medicine with or without food. Take your doses at regular intervals. Do not take your medicine more often than directed. Do not stop taking except on the advice of your doctor or health care professional. A special MedGuide will be given to you by the pharmacist with each prescription and refill. Be sure to read this information carefully each time. Talk to your pediatrician regarding the use of this medicine in children. While this drug may be prescribed for children as young as 6 years of age for selected conditions, precautions do apply. Overdosage: If you think you have taken too much of this medicine contact a poison control center or emergency room at once. NOTE: This medicine is only for you. Do not share  this medicine with others. What if I miss a dose? If you miss a dose, take it as soon as you can. If it is almost time for your next dose, take only that dose. Do not take double or extra doses. What may interact with this medicine? Do not take this medicine with any of the following medications: -brexpiprazole -cisapride -dofetilide -dronedarone -metoclopramide -pimozide -thioridazine This medicine may also interact with the following medications: -alcohol -carbamazepine -certain medicines for anxiety or sleep -certain medicines for blood pressure -certain medicines for fungal infections like ketoconazole, fluconazole, posaconazole, and itraconazole -clarithromycin -fluoxetine -other medicines that prolong the QT interval (cause an abnormal heart rhythm) -paroxetine -quinidine -rifampin This list may not describe all possible interactions. Give your health care provider a list of all the medicines, herbs, non-prescription drugs, or dietary supplements you use. Also tell them if you smoke, drink alcohol, or use illegal drugs. Some items may interact with your medicine. What should I watch for while using this medicine? Visit your doctor or health care professional for regular checks on your progress. It may be several weeks before you see the full effects of this medicine. Do not suddenly stop taking this medicine. You may need to gradually reduce the dose. Patients and their families should watch out for worsening depression or thoughts of suicide. Also watch out for sudden changes in feelings such as feeling anxious, agitated, panicky, irritable, hostile, aggressive, impulsive, severely restless, overly excited and hyperactive, or not being able to sleep. If this happens, especially at the beginning of antidepressant treatment or after a change in dose, call your health care professional. You may get dizzy or drowsy. Do   not drive, use machinery, or do anything that needs mental  alertness until you know how this medicine affects you. Do not stand or sit up quickly, especially if you are an older patient. This reduces the risk of dizzy or fainting spells. Alcohol can increase dizziness and drowsiness. Avoid alcoholic drinks. This medicine can reduce the response of your body to heat or cold. Dress warmly in cold weather and stay hydrated in hot weather. If possible, avoid extreme temperatures like saunas, hot tubs, very hot or cold showers, or activities that can cause dehydration such as vigorous exercise. This medicine may cause dry eyes and blurred vision. If you wear contact lenses, you may feel some discomfort. Lubricating drops may help. See your eye doctor if the problem does not go away or is severe. If you notice an increased hunger or thirst, different from your normal hunger or thirst, or if you find that you have to urinate more frequently, you should contact your health care provider as soon as possible. You may need to have your blood sugar monitored. This medicine may cause changes in your blood sugar levels. You should monitor your blood sugar frequently if you have diabetes. There have been reports of uncontrollable and strong urges to gamble, binge eat, shop, and have sex while taking this medicine. If you experience any of these or other uncontrollable and strong urges while taking this medicine, you should report it to your health care provider as soon as possible. What side effects may I notice from receiving this medicine? Side effects that you should report to your doctor or health care professional as soon as possible: -allergic reactions like skin rash, itching or hives, swelling of the face, lips, or tongue -breathing problems -confusion -feeling faint or lightheaded, falls -fever or chills, sore throat -increased hunger or thirst -increased urination -joint pain -muscles pain, spasms -problems with balance, talking, walking -restlessness or need to  keep moving -seizures -suicidal thoughts or other mood changes -trouble swallowing -uncontrollable and excessive urges (examples: gambling, binge eating, shopping, having sex) -uncontrollable head, mouth, neck, arm, or leg movements -unusually weak or tired Side effects that usually do not require medical attention (report to your doctor or health care professional if they continue or are bothersome): -blurred vision -constipation -headache -nausea, vomiting -trouble sleeping -weight gain This list may not describe all possible side effects. Call your doctor for medical advice about side effects. You may report side effects to FDA at 1-800-FDA-1088. Where should I keep my medicine? Keep out of the reach of children. Store at room temperature between 15 and 30 degrees C (59 and 86 degrees F). Throw away any unused medicine after the expiration date. NOTE: This sheet is a summary. It may not cover all possible information. If you have questions about this medicine, talk to your doctor, pharmacist, or health care provider.  2019 Elsevier/Gold Standard (2018-03-18 09:13:39)  

## 2018-12-13 NOTE — Progress Notes (Signed)
[facility] MD OP Progress Note  12/13/2018 5:31 PM Shirley Alvarado  MRN:  [id number]  Chief Complaint: ' I am here for follow up." Chief Complaint    Follow-up     HPI: Shirley Alvarado is a 68 yr old Caucasian female lives in [place], has a history of depression, anxiety, panic disorder, COPD, presented to clinic today for a follow-up visit.  Patient today reports she has been feeling extremely anxious since the past few weeks.  She reports she had surgery to remove skin cancer recently.  She reports because of her health problems she is becoming more and more anxious.  She reports she stopped taking the Seroquel few weeks ago since it made her dizzy.  Patient reports she has been using Valium from her sister on a regular basis and that has been helpful with her anxiety symptoms.  Patient denies any suicidality, homicidality or perceptual disturbances.  Some time was spent educating patient about using medications that is not prescribed to her.  Patient was advised against the use of benzodiazepine medication which is a controlled substance given her multiple health issues, her age, being oxygen dependent discussed with her that benzodiazepines may not be the right choice for her.  Also discussed with her that benzodiazepine use in combination with her pain medications can also make her to be at risk for significant problems including death.  Discussed with patient about restarting psychotherapy sessions with Ms. Miguel Dibble.  Also discussed adding a medication to augment her Zoloft.  She agrees with plan. Visit Diagnosis:    ICD-10-CM   1. Major depressive disorder, recurrent episode, moderate (HCC) F33.1 ARIPiprazole (ABILIFY) 2 MG tablet  2. GAD (generalized anxiety disorder) F41.1   3. Panic disorder F41.0   4. 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 Anxiety   . Basal cell carcinoma   . Cancer (HCC)    COLON  . Chronic pain   . COPD (chronic obstructive pulmonary disease) ([place])   . Depression   . GERD (gastroesophageal reflux disease)   . Hypertension   . Ventral hernia     Past Surgical History:  Procedure Laterality Date  . CAROTID PTA/STENT INTERVENTION Right 01/25/2018   Procedure: CAROTID PTA/STENT INTERVENTION;  Surgeon: Algernon Huxley, MD;  Location: [facility];  Service: Cardiovascular;  Laterality: Right;  . CATARACT EXTRACTION W/PHACO Left 08/04/2016   Procedure: CATARACT EXTRACTION PHACO AND INTRAOCULAR LENS PLACEMENT ([facility]);  Surgeon: Ronnell Freshwater, MD;  Location: [facility];  Service: Ophthalmology;  Laterality: Left;  LEFT  . COLON SURGERY    . TUBAL LIGATION      Family Psychiatric History: I have reviewed family psychiatric history from my progress note on 02/27/2028  Family History:  Family History  Problem Relation Age of Onset  . Lung cancer Mother   . Hypertension Mother   . Diabetes Mother   . Anxiety disorder Mother   . Depression Mother   . Breast cancer Mother   . Hypertension Father   . Diabetes Father   . Heart attack Father   . Depression Father   . Anxiety disorder Father   . Alcohol abuse Father   . Drug abuse Father   . Cancer Sister   . Diabetes Sister   . Hypertension Sister   . Obesity  Sister   . Hypertension Brother   . Hypertension Brother   . Liver disease Brother   . Colon cancer Brother   . Breast cancer Maternal Aunt   . Breast cancer Maternal Aunt   . Breast cancer Maternal Aunt     Social History: Reviewed social history from my progress note on 02/26/2018 Social History   Socioeconomic History  . Marital status: Widowed    Spouse name: Not on file  . Number of children: 2  . Years of education: Not on file  . Highest education level: 11th grade  Occupational History    Comment: retired  Scientific laboratory technician  .  Financial resource strain: Very hard  . Food insecurity:    Worry: Sometimes true    Inability: Sometimes true  . Transportation needs:    Medical: No    Non-medical: No  Tobacco Use  . Smoking status: Current Every Day Smoker    Packs/day: 0.50    Years: 50.00    Pack years: 25.00    Types: Cigarettes    Start date: 05/21/1970  . Smokeless tobacco: Never Used  . Tobacco comment: However patient relates that she smoked for 50 years and at one point she was up to 2 packs per day  Substance and Sexual Activity  . Alcohol use: Yes    Alcohol/week: 2.0 standard drinks    Types: 2 Cans of beer per week  . Drug use: No  . Sexual activity: Not Currently  Lifestyle  . Physical activity:    Days per week: 0 days    Minutes per session: 0 min  . Stress: Only a little  Relationships  . Social connections:    Talks on phone: More than three times a week    Gets together: Once a week    Attends religious service: More than 4 times per year    Active member of club or organization: Yes    Attends meetings of clubs or organizations: More than 4 times per year    Relationship status: Widowed  Other Topics Concern  . Not on file  Social History Narrative  . Not on file    Allergies:  Allergies  Allergen Reactions  . Minocycline Anaphylaxis    Throat swelling  . Levofloxacin Hives    Metabolic Disorder Labs: No results found for: HGBA1C, MPG No results found for: PROLACTIN Lab Results  Component Value Date   CHOL 172 08/29/2013   TRIG 153 08/29/2013   HDL 65 (H) 08/29/2013   VLDL 31 08/29/2013   LDLCALC 76 08/29/2013   No results found for: TSH  Therapeutic Level Labs: No results found for: LITHIUM No results found for: VALPROATE No components found for:  CBMZ  Current Medications: Current Outpatient Medications  Medication Sig Dispense Refill  . albuterol (PROAIR HFA) 108 (90 BASE) MCG/ACT inhaler Inhale 2 puffs into the lungs every 6 (six) hours as needed.     Marland Kitchen  amLODipine (NORVASC) 10 MG tablet Take 10 mg by mouth daily.     Marland Kitchen amoxicillin-clavulanate (AUGMENTIN) 875-125 MG tablet     . aspirin EC 81 MG tablet Take 81 mg by mouth daily.    Marland Kitchen atorvastatin (LIPITOR) 10 MG tablet TAKE 1 TABLET BY MOUTH EVERY DAY 90 tablet 4  . clopidogrel (PLAVIX) 75 MG tablet Take 2 tablets (150 mg total) by mouth once for 1 dose. 2 tablet 0  . clopidogrel (PLAVIX) 75 MG tablet TAKE 1 TABLET BY MOUTH EVERY DAY 90  tablet 3  . fentaNYL (DURAGESIC - DOSED MCG/HR) 25 MCG/HR patch Place 25 mcg onto the skin every 3 (three) days.    . Fluticasone-Salmeterol (ADVAIR DISKUS) 250-50 MCG/DOSE AEPB Inhale 1 puff into the lungs 2 (two) times daily.     . hydrochlorothiazide (HYDRODIURIL) 25 MG tablet Take by mouth.    . hydrOXYzine (VISTARIL) 25 MG capsule Take 1 capsule (25 mg total) by mouth 3 (three) times daily as needed (for severe anxiety symptoms). 270 capsule 1  . liothyronine (CYTOMEL) 50 MCG tablet Take 50 mcg by mouth daily.  3  . loratadine (CLARITIN) 10 MG tablet Take 10 mg by mouth daily.     Marland Kitchen losartan (COZAAR) 100 MG tablet Take by mouth.    . meloxicam (MOBIC) 15 MG tablet Take 15 mg by mouth daily.    . mometasone (NASONEX) 50 MCG/ACT nasal spray Place 1 spray into the nose daily.    . montelukast (SINGULAIR) 10 MG tablet TAKE 1 TABLET BY MOUTH DAILY    . naloxone (NARCAN) nasal spray 4 mg/0.1 mL 4 mg (1 nasal spray) prn for opioid overdose; repeat every 2 to 3 minutes in alternating nostrils until medical assistance available    . nystatin cream (MYCOSTATIN) Apply topically.    Marland Kitchen oxyCODONE (ROXICODONE) 15 MG immediate release tablet Take 15 mg by mouth every 6 (six) hours as needed for pain.     Marland Kitchen oxyCODONE (ROXICODONE) 15 MG immediate release tablet Take by mouth.    . predniSONE (DELTASONE) 10 MG tablet TAKE 4 PILLS FOR 2 DAYS, 3 PILLS FOR 2 DAYS, 2 PILLS FOR 2 DAYS, 1 PILL FOR 2 DAYS.  0  . ranitidine (ZANTAC) 150 MG capsule Take 150 mg by mouth 2 (two) times  daily.     . sertraline (ZOLOFT) 100 MG tablet Take 1 tablet (100 mg total) by mouth daily. 90 tablet 0  . sulfamethoxazole-trimethoprim (BACTRIM DS,SEPTRA DS) 800-160 MG tablet Take 2 tablets by mouth 2 (two) times daily. 40 tablet 0  . theophylline (UNIPHYL) 400 MG 24 hr tablet Take 400 mg by mouth daily.    Marland Kitchen tiotropium (SPIRIVA HANDIHALER) 18 MCG inhalation capsule Place 18 mcg into inhaler and inhale daily.     Marland Kitchen umeclidinium-vilanterol (ANORO ELLIPTA) 62.5-25 MCG/INH AEPB Inhale 1 puff into the lungs daily.    . ARIPiprazole (ABILIFY) 2 MG tablet Take 1 tablet (2 mg total) by mouth daily. 30 tablet 1  . cetirizine (ZYRTEC) 10 MG tablet TAKE 1 TABLET BY MOUTH EVERY DAY AT NIGHT  11  . liothyronine (CYTOMEL) 50 MCG tablet Take 50 mcg by mouth daily.     . ranitidine (ZANTAC) 150 MG tablet      No current facility-administered medications for this visit.      Musculoskeletal: Strength & Muscle Tone: within normal limits Gait & Station: normal Patient leans: N/A  Psychiatric Specialty Exam: Review of Systems  Psychiatric/Behavioral: Positive for depression. The patient is nervous/anxious.   All other systems reviewed and are negative.   Blood pressure 95/61, pulse (!) 106, temperature 97.9 F (36.6 C), temperature source Oral, weight 137 lb (62.1 kg), SpO2 93 %.Body mass index is 24.27 kg/m.  General Appearance: Casual  Eye Contact:  Fair  Speech:  Clear and Coherent  Volume:  Normal  Mood:  Anxious and Dysphoric  Affect:  Congruent  Thought Process:  Goal Directed and Descriptions of Associations: Intact  Orientation:  Full (Time, Place, and Person)  Thought Content: Logical  Suicidal Thoughts:  No  Homicidal Thoughts:  No  Memory:  Immediate;   Fair Recent;   Fair Remote;   Fair  Judgement:  Fair  Insight:  Fair  Psychomotor Activity:  Normal  Concentration:  Concentration: Fair and Attention Span: Fair  Recall:  [employer] of Knowledge: Fair  Language: Fair   Akathisia:  No  Handed:  Right  AIMS (if indicated):0  Assets:  Communication Skills Desire for Improvement Social Support  ADL's:  Intact  Cognition: WNL  Sleep:  Fair   Screenings:   Assessment and Plan: Kentrell is a 68 year old Caucasian female who has a history of depression, anxiety, panic attacks, COPD, hypertension, chronic pain, history of colon cancer, history of skin cancer, presented to clinic today for a follow-up visit.  Patient reports she has been noncompliant with some of her medications.  Patient also reports she has been using her sister's Valium to help with her anxiety symptoms.  She has chronic hyponatremia which is currently being managed by her primary medical doctor.  Discussed with patient about restarting psychotherapy sessions as well as adding medication to help with her anxiety symptoms.  She agrees with plan.  Plan MDD- worsening Continue Zoloft at reduced dosage of 100 mg p.o. daily Discontinue Seroquel for noncompliance Start Abilify 2 mg p.o. daily  For anxiety-unstable Discontinue Seroquel for noncompliance Start Abilify 2 mg p.o. daily Continue Zoloft as prescribed Discussed with patient given her multiple medical problems she will benefit more from psychotherapy sessions, she will call Ms. Miguel Dibble to make an appointment.  For insomnia- improving Patient reports she has not had any problem with her sleep recently.  Benzodiazepine abuse- unstable Discussed with patient not to use medications that are not prescribed to her.  Also discussed with her the risk of combining benzodiazepines with her opioid medications.  Follow up in clinic in 4 weeks or sooner if needed.  I have spent atleast 15 minutes face to face with patient today. More than 50 % of the time was spent for psychoeducation and supportive psychotherapy and care coordination.  This note was generated in part or whole with voice recognition software. Voice recognition is usually  quite accurate but there are transcription errors that can and very often do occur. I apologize for any typographical errors that were not detected and corrected.       Ursula Alert, MD 12/13/2018, 5:31 PM

## 2018-12-20 ENCOUNTER — Telehealth: Payer: Self-pay

## 2018-12-20 DIAGNOSIS — F411 Generalized anxiety disorder: Secondary | ICD-10-CM

## 2018-12-20 DIAGNOSIS — F331 Major depressive disorder, recurrent, moderate: Secondary | ICD-10-CM

## 2018-12-20 DIAGNOSIS — F410 Panic disorder [episodic paroxysmal anxiety] without agoraphobia: Secondary | ICD-10-CM

## 2018-12-20 NOTE — Telephone Encounter (Signed)
pt called left message that she can not take the abilify. pt states she feels like she is on speed.  she can not eat , sleep  or breath with this medication.

## 2018-12-20 NOTE — Telephone Encounter (Signed)
Please ask her to stop Abilify and ask her to be seen in clinic. thanks

## 2018-12-29 NOTE — Telephone Encounter (Signed)
pt called left a message that she needed refills and that she also needed something for sleep.

## 2018-12-30 MED ORDER — SUVOREXANT 5 MG PO TABS: 5.0000 mg | ORAL_TABLET | Freq: Every day | ORAL | 1 refills | Status: DC

## 2018-12-30 MED ORDER — SERTRALINE HCL 100 MG PO TABS: 100.0000 mg | ORAL_TABLET | Freq: Every day | ORAL | 0 refills | Status: DC

## 2018-12-30 NOTE — Telephone Encounter (Signed)
Called patient to discuss medications. She is no longer on abilify. She wants to continue zoloft. Will add Belsomra for sleep. Will send it to pharmacy.

## 2019-01-10 ENCOUNTER — Encounter: Payer: Self-pay | Admitting: Psychiatry

## 2019-01-10 ENCOUNTER — Ambulatory Visit (INDEPENDENT_AMBULATORY_CARE_PROVIDER_SITE_OTHER): Payer: Medicare Other | Admitting: Psychiatry

## 2019-01-10 ENCOUNTER — Other Ambulatory Visit: Payer: Self-pay

## 2019-01-10 DIAGNOSIS — F5105 Insomnia due to other mental disorder: Secondary | ICD-10-CM

## 2019-01-10 DIAGNOSIS — F411 Generalized anxiety disorder: Secondary | ICD-10-CM

## 2019-01-10 DIAGNOSIS — F410 Panic disorder [episodic paroxysmal anxiety] without agoraphobia: Secondary | ICD-10-CM

## 2019-01-10 DIAGNOSIS — F1310 Sedative, hypnotic or anxiolytic abuse, uncomplicated: Secondary | ICD-10-CM | POA: Diagnosis not present

## 2019-01-10 DIAGNOSIS — F331 Major depressive disorder, recurrent, moderate: Secondary | ICD-10-CM | POA: Diagnosis not present

## 2019-01-10 NOTE — Progress Notes (Signed)
TC on  01-10-19 @10 :24 spoke with patients . Medications and pharmacy was reviewed and updated.   Allergies , medical hx and surgery hx was reviewed with no changes Vital unable to obtain due to this is a phone or web consult.

## 2019-01-10 NOTE — Progress Notes (Signed)
Virtual Visit via Telephone Note  I connected with Shirley Alvarado on 01/10/19 at 11:30 AM EDT by telephone and verified that I am speaking with the correct person using two identifiers.   I discussed the limitations, risks, security and privacy concerns of performing an evaluation and management service by telephone and the availability of in person appointments. I also discussed with the patient that there may be a patient responsible charge related to this service. The patient expressed understanding and agreed to proceed.  I discussed the assessment and treatment plan with the patient. The patient was provided an opportunity to ask questions and all were answered. The patient agreed with the plan and demonstrated an understanding of the instructions.   The patient was advised to call back or seek an in-person evaluation if the symptoms worsen or if the condition fails to improve as anticipated.  I provided 15 minutes of non-face-to-face time during this encounter.   Ursula Alert, MD  [facility] MD  OP Progress Note  01/10/2019 1:41 PM Shirley Alvarado  MRN:  [id number]  Chief Complaint:  Chief Complaint    Follow-up     HPI: Shirley Alvarado is a 68 year old Caucasian female, lives in [place], has a history of depression, anxiety, panic attacks, COPD was evaluated by phone today.  Patient reports she continues to have anxiety symptoms on and off.  She has multiple health problems as well as the current COVID-19 crisis which is a stressor for her.  Patient reports she continues to be compliant with her medications as prescribed.  Patient was advised to start psychotherapy sessions multiple times previously.  The last visit she had agreed to call Ms. Tina Thompson-therapist to schedule an appointment as soon as possible.  Today however patient reports she did not do so because it is too far for her and she needs money for her gas if she has to go there.  Discussed with her again about maybe finding a  therapist and a psychiatrist closer to home since she lives in [place] now.  She agrees with the referral.  Patient reports she tried the [place] for sleep.  She reports it may be making her sleep too much.  Discussed with patient about may be having earlier bedtime to see if that will help.  Belsomra is at a low dose of 5 mg.  Patient had difficulty with sleeping previously on multiple medication trials.  She also has multiple medical problems and is on polypharmacy.  Patient denies any suicidality, homicidality or perceptual disturbances.  Patient has social support from her sister who lives with her.  Patient denies any other concerns today. Visit Diagnosis:    ICD-10-CM   1. Major depressive disorder, recurrent episode, moderate (HCC) F33.1   2. GAD (generalized anxiety disorder) F41.1   3. Panic disorder F41.0   4. 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 Insomnia due to mental disorder F51.05     Past Psychiatric History: I have reviewed past psychiatric history from my progress note on 02/26/2018.  Past Medical History:  Past Medical History:  Diagnosis Date  . Anxiety   . Basal cell carcinoma   . Cancer (HCC)    COLON  . Chronic pain   . COPD (chronic obstructive pulmonary disease) ([place])   . Depression   . GERD (gastroesophageal reflux disease)   . Hypertension   . Ventral hernia     Past Surgical History:  Procedure Laterality Date  . CAROTID PTA/STENT INTERVENTION Right 01/25/2018  Procedure: CAROTID PTA/STENT INTERVENTION;  Surgeon: Algernon Huxley, MD;  Location: [facility];  Service: Cardiovascular;  Laterality: Right;  . CATARACT EXTRACTION W/PHACO Left 08/04/2016   Procedure: CATARACT EXTRACTION PHACO AND INTRAOCULAR LENS PLACEMENT (IOC);  Surgeon: Ronnell Freshwater, MD;  Location: [facility];  Service: Ophthalmology;  Laterality: Left;  LEFT  . COLON SURGERY    . TUBAL LIGATION      Family Psychiatric History: Reviewed  family psychiatric history from my progress note on 02/26/2018  Family History:  Family History  Problem Relation Age of Onset  . Lung cancer Mother   . Hypertension Mother   . Diabetes Mother   . Anxiety disorder Mother   . Depression Mother   . Breast cancer Mother   . Hypertension Father   . Diabetes Father   . Heart attack Father   . Depression Father   . Anxiety disorder Father   . Alcohol abuse Father   . Drug abuse Father   . Cancer Sister   . Diabetes Sister   . Hypertension Sister   . Obesity Sister   . Hypertension Brother   . Hypertension Brother   . Liver disease Brother   . Colon cancer Brother   . Breast cancer Maternal Aunt   . Breast cancer Maternal Aunt   . Breast cancer Maternal Aunt     Social History: Reviewed social history from my progress note on 02/26/2018. Social History   Socioeconomic History  . Marital status: Widowed    Spouse name: Not on file  . Number of children: 2  . Years of education: Not on file  . Highest education level: 11th grade  Occupational History    Comment: retired  Scientific laboratory technician  . Financial resource strain: Very hard  . Food insecurity:    Worry: Sometimes true    Inability: Sometimes true  . Transportation needs:    Medical: No    Non-medical: No  Tobacco Use  . Smoking status: Current Every Day Smoker    Packs/day: 0.50    Years: 50.00    Pack years: 25.00    Types: Cigarettes    Start date: 05/21/1970  . Smokeless tobacco: Never Used  . Tobacco comment: However patient relates that she smoked for 50 years and at one point she was up to 2 packs per day  Substance and Sexual Activity  . Alcohol use: Yes    Alcohol/week: 2.0 standard drinks    Types: 2 Cans of beer per week  . Drug use: No  . Sexual activity: Not Currently  Lifestyle  . Physical activity:    Days per week: 0 days    Minutes per session: 0 min  . Stress: Only a little  Relationships  . Social connections:    Talks on phone: More than  three times a week    Gets together: Once a week    Attends religious service: More than 4 times per year    Active member of club or organization: Yes    Attends meetings of clubs or organizations: More than 4 times per year    Relationship status: Widowed  Other Topics Concern  . Not on file  Social History Narrative  . Not on file    Allergies:  Allergies  Allergen Reactions  . Minocycline Anaphylaxis    Throat swelling  . Levofloxacin Hives    Metabolic Disorder Labs: No results found for: HGBA1C, MPG No results found for: PROLACTIN Lab  Results  Component Value Date   CHOL 172 08/29/2013   TRIG 153 08/29/2013   HDL 65 (H) 08/29/2013   VLDL 31 08/29/2013   LDLCALC 76 08/29/2013   No results found for: TSH  Therapeutic Level Labs: No results found for: LITHIUM No results found for: VALPROATE No components found for:  CBMZ  Current Medications: Current Outpatient Medications  Medication Sig Dispense Refill  . albuterol (PROAIR HFA) 108 (90 BASE) MCG/ACT inhaler Inhale 2 puffs into the lungs every 6 (six) hours as needed.     Marland Kitchen amLODipine (NORVASC) 10 MG tablet Take 10 mg by mouth daily.     Marland Kitchen aspirin EC 81 MG tablet Take 81 mg by mouth daily.    Marland Kitchen atorvastatin (LIPITOR) 10 MG tablet TAKE 1 TABLET BY MOUTH EVERY DAY 90 tablet 4  . cetirizine (ZYRTEC) 10 MG tablet TAKE 1 TABLET BY MOUTH EVERY DAY AT NIGHT  11  . clopidogrel (PLAVIX) 75 MG tablet Take 2 tablets (150 mg total) by mouth once for 1 dose. 2 tablet 0  . fentaNYL (DURAGESIC - DOSED MCG/HR) 25 MCG/HR patch Place 25 mcg onto the skin every 3 (three) days.    . Fluticasone-Salmeterol (ADVAIR DISKUS) 250-50 MCG/DOSE AEPB Inhale 1 puff into the lungs 2 (two) times daily.     . hydrOXYzine (VISTARIL) 25 MG capsule Take 1 capsule (25 mg total) by mouth 3 (three) times daily as needed (for severe anxiety symptoms). 270 capsule 1  . liothyronine (CYTOMEL) 50 MCG tablet Take 50 mcg by mouth daily.  3  . loratadine  (CLARITIN) 10 MG tablet Take 10 mg by mouth daily.     Marland Kitchen losartan (COZAAR) 100 MG tablet Take by mouth.    . losartan-hydrochlorothiazide (HYZAAR) 100-25 MG tablet Take by mouth.    . meloxicam (MOBIC) 15 MG tablet Take 15 mg by mouth daily.    . mometasone (NASONEX) 50 MCG/ACT nasal spray Place 1 spray into the nose daily.    . montelukast (SINGULAIR) 10 MG tablet TAKE 1 TABLET BY MOUTH DAILY    . naloxone (NARCAN) nasal spray 4 mg/0.1 mL 4 mg (1 nasal spray) prn for opioid overdose; repeat every 2 to 3 minutes in alternating nostrils until medical assistance available    . nystatin cream (MYCOSTATIN) Apply topically.    Marland Kitchen oxyCODONE (ROXICODONE) 15 MG immediate release tablet Take by mouth.    . predniSONE (DELTASONE) 10 MG tablet TAKE 4 PILLS FOR 2 DAYS, 3 PILLS FOR 2 DAYS, 2 PILLS FOR 2 DAYS, 1 PILL FOR 2 DAYS.  0  . ranitidine (ZANTAC) 150 MG capsule Take 150 mg by mouth 2 (two) times daily.     . ranitidine (ZANTAC) 150 MG tablet     . sertraline (ZOLOFT) 100 MG tablet Take 1 tablet (100 mg total) by mouth daily. 90 tablet 0  . sulfamethoxazole-trimethoprim (BACTRIM DS,SEPTRA DS) 800-160 MG tablet Take 2 tablets by mouth 2 (two) times daily. 40 tablet 0  . Suvorexant (BELSOMRA) 5 MG TABS Take 5 mg by mouth at bedtime. For sleep 30 tablet 1  . theophylline (UNIPHYL) 400 MG 24 hr tablet Take 400 mg by mouth daily.    Marland Kitchen tiotropium (SPIRIVA HANDIHALER) 18 MCG inhalation capsule Place 18 mcg into inhaler and inhale daily.     Marland Kitchen umeclidinium-vilanterol (ANORO ELLIPTA) 62.5-25 MCG/INH AEPB Inhale 1 puff into the lungs daily.    Marland Kitchen liothyronine (CYTOMEL) 50 MCG tablet Take 50 mcg by mouth daily.  No current facility-administered medications for this visit.      Musculoskeletal: Strength & Muscle Tone: UTA Gait & Station: UTA Patient leans: N/A  Psychiatric Specialty Exam: Review of Systems  Psychiatric/Behavioral: The patient is nervous/anxious.   All other systems reviewed and are  negative.   There were no vitals taken for this visit.There is no height or weight on file to calculate BMI.  General Appearance: UTA  Eye Contact:  UTA  Speech:  Clear and Coherent  Volume:  Normal  Mood:  Anxious  Affect:  UTA  Thought Process:  Goal Directed and Descriptions of Associations: Intact  Orientation:  Full (Time, Place, and Person)  Thought Content: Logical   Suicidal Thoughts:  No  Homicidal Thoughts:  No  Memory:  Immediate;   Fair Recent;   Fair Remote;   Fair  Judgement:  Fair  Insight:  Fair  Psychomotor Activity:  UTA  Concentration:  Concentration: Fair and Attention Span: Fair  Recall:  [employer] of Knowledge: Fair  Language: Fair  Akathisia:  No  Handed:  Right  AIMS (if indicated): UTA  Assets:  Communication Skills Desire for Improvement Social Support  ADL's:  Intact  Cognition: WNL  Sleep:  Excessive   Screenings:   Assessment and Plan: Shirley Alvarado is a 68 year old Caucasian female who has a history of depression, anxiety, panic attacks, COPD, hypertension, chronic pain, history of colon cancer, history of skin cancer was evaluated by phone today.  Patient currently reports anxiety symptoms due to the current COVID-19 crisis.  She also reports some excessive sleep on the current sleep medication.  Patient with history of chronic hyponatremia which is currently being managed by her primary medical doctor.  Patient was advised to start psychotherapy session to which she had agreed to previously however currently reports it is too far for her.  She agrees with referral to [facility] health clinic in [place] which is closer to her for therapy and medication management.  Plan as noted below.  Plan MDD-some improvement Zoloft at reduced dosage of 100 mg p.o. daily Refer for CBT.  For anxiety- unstable Zoloft as prescribed. She also has hydroxyzine as needed available. Patient was referred to Ms. Miguel Dibble previously however currently reports it is too  far for her.  Patient reports she is interested in referral to [facility].  She possibly may be able to establish care with a therapist there to.  For insomnia- some progress Patient with significant sleep problems previously failed multiple trials of medications.  Patient however today reports she may be sleeping too much on the [place]. Discussed having an earlier bedtime and also giving the medication more time to get adjusted to. For now we will continue Belsomra 5 mg at bedtime.  Benzodiazepine abuse-we will continue to monitor closely.  Patient was advised previously not to use medications that are not prescribed to her.  I have spent atleast 15 minutes non face to face with patient today. More than 50 % of the time was spent for psychoeducation and supportive psychotherapy and care coordination.  This note was generated in part or whole with voice recognition software. Voice recognition is usually quite accurate but there are transcription errors that can and very often do occur. I apologize for any typographical errors that were not detected and corrected.        Ursula Alert, MD 01/10/2019, 1:41 PM

## 2019-02-25 NOTE — Progress Notes (Deleted)
[facility] MD/PA/NP OP Progress Note  02/25/2019 9:41 AM Shirley Alvarado  MRN:  [id number]  Chief Complaint:  HPI:  Shirley Alvarado is a 68 y.o. 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 Shea Evans.       Visit Diagnosis: No diagnosis found.  Past Psychiatric History:  Outpatient:  Psychiatry admission:  Previous suicide attempt:  Past trials of medication:  History of violence:   Past Medical History:  Past Medical History:  Diagnosis Date  . Anxiety   . Basal cell carcinoma   . Cancer (HCC)    COLON  . Chronic pain   . COPD (chronic obstructive pulmonary disease) ([place])   . Depression   . GERD (gastroesophageal reflux disease)   . Hypertension   . Ventral hernia     Past Surgical History:  Procedure Laterality Date  . CAROTID PTA/STENT INTERVENTION Right 01/25/2018   Procedure: CAROTID PTA/STENT INTERVENTION;  Surgeon: Algernon Huxley, MD;  Location: [facility];  Service: Cardiovascular;  Laterality: Right;  . CATARACT EXTRACTION W/PHACO Left 08/04/2016   Procedure: CATARACT EXTRACTION PHACO AND INTRAOCULAR LENS PLACEMENT (IOC);  Surgeon: Ronnell Freshwater, MD;  Location: [facility];  Service: Ophthalmology;  Laterality: Left;  LEFT  . COLON SURGERY    . TUBAL LIGATION      Family Psychiatric History: ***  Family History:  Family History  Problem Relation Age of Onset  . Lung cancer Mother   . Hypertension Mother   . Diabetes Mother   . Anxiety disorder Mother   . Depression Mother   . Breast cancer Mother   . Hypertension Father   . Diabetes Father   . Heart attack Father   . Depression Father   . Anxiety disorder Father   . Alcohol abuse Father   . Drug abuse Father   . Cancer Sister   . Diabetes Sister   . Hypertension Sister   . Obesity Sister   . Hypertension Brother   .  Hypertension Brother   . Liver disease Brother   . Colon cancer Brother   . Breast cancer Maternal Aunt   . Breast cancer Maternal Aunt   . Breast cancer Maternal Aunt     Social History:  Social History   Socioeconomic History  . Marital status: Widowed    Spouse name: Not on file  . Number of children: 2  . Years of education: Not on file  . Highest education level: 11th grade  Occupational History    Comment: retired  Scientific laboratory technician  . Financial resource strain: Very hard  . Food insecurity:    Worry: Sometimes true    Inability: Sometimes true  . Transportation needs:    Medical: No    Non-medical: No  Tobacco Use  . Smoking status: Current Every Day Smoker    Packs/day: 0.50    Years: 50.00    Pack years: 25.00    Types: Cigarettes    Start date: 05/21/1970  . Smokeless tobacco: Never Used  . Tobacco comment: However patient relates that she smoked for 50 years and at one point she was up to 2 packs per day  Substance and Sexual Activity  . Alcohol use: Yes    Alcohol/week: 2.0 standard drinks    Types: 2 Cans of beer per week  . Drug use: No  . Sexual  activity: Not Currently  Lifestyle  . Physical activity:    Days per week: 0 days    Minutes per session: 0 min  . Stress: Only a little  Relationships  . Social connections:    Talks on phone: More than three times a week    Gets together: Once a week    Attends religious service: More than 4 times per year    Active member of club or organization: Yes    Attends meetings of clubs or organizations: More than 4 times per year    Relationship status: Widowed  Other Topics Concern  . Not on file  Social History Narrative  . Not on file    Allergies:  Allergies  Allergen Reactions  . Minocycline Anaphylaxis    Throat swelling  . Levofloxacin Hives    Metabolic Disorder Labs: No results found for: HGBA1C, MPG No results found for: PROLACTIN Lab Results  Component Value Date   CHOL 172 08/29/2013    TRIG 153 08/29/2013   HDL 65 (H) 08/29/2013   VLDL 31 08/29/2013   LDLCALC 76 08/29/2013   No results found for: TSH  Therapeutic Level Labs: No results found for: LITHIUM No results found for: VALPROATE No components found for:  CBMZ  Current Medications: Current Outpatient Medications  Medication Sig Dispense Refill  . albuterol (PROAIR HFA) 108 (90 BASE) MCG/ACT inhaler Inhale 2 puffs into the lungs every 6 (six) hours as needed.     Marland Kitchen amLODipine (NORVASC) 10 MG tablet Take 10 mg by mouth daily.     Marland Kitchen aspirin EC 81 MG tablet Take 81 mg by mouth daily.    Marland Kitchen atorvastatin (LIPITOR) 10 MG tablet TAKE 1 TABLET BY MOUTH EVERY DAY 90 tablet 4  . cetirizine (ZYRTEC) 10 MG tablet TAKE 1 TABLET BY MOUTH EVERY DAY AT NIGHT  11  . clopidogrel (PLAVIX) 75 MG tablet Take 2 tablets (150 mg total) by mouth once for 1 dose. 2 tablet 0  . fentaNYL (DURAGESIC - DOSED MCG/HR) 25 MCG/HR patch Place 25 mcg onto the skin every 3 (three) days.    . Fluticasone-Salmeterol (ADVAIR DISKUS) 250-50 MCG/DOSE AEPB Inhale 1 puff into the lungs 2 (two) times daily.     . hydrOXYzine (VISTARIL) 25 MG capsule Take 1 capsule (25 mg total) by mouth 3 (three) times daily as needed (for severe anxiety symptoms). 270 capsule 1  . liothyronine (CYTOMEL) 50 MCG tablet Take 50 mcg by mouth daily.     Marland Kitchen liothyronine (CYTOMEL) 50 MCG tablet Take 50 mcg by mouth daily.  3  . loratadine (CLARITIN) 10 MG tablet Take 10 mg by mouth daily.     Marland Kitchen losartan (COZAAR) 100 MG tablet Take by mouth.    . losartan-hydrochlorothiazide (HYZAAR) 100-25 MG tablet Take by mouth.    . meloxicam (MOBIC) 15 MG tablet Take 15 mg by mouth daily.    . mometasone (NASONEX) 50 MCG/ACT nasal spray Place 1 spray into the nose daily.    . montelukast (SINGULAIR) 10 MG tablet TAKE 1 TABLET BY MOUTH DAILY    . naloxone (NARCAN) nasal spray 4 mg/0.1 mL 4 mg (1 nasal spray) prn for opioid overdose; repeat every 2 to 3 minutes in alternating nostrils until  medical assistance available    . nystatin cream (MYCOSTATIN) Apply topically.    Marland Kitchen oxyCODONE (ROXICODONE) 15 MG immediate release tablet Take by mouth.    . predniSONE (DELTASONE) 10 MG tablet TAKE 4 PILLS FOR 2 DAYS,  3 PILLS FOR 2 DAYS, 2 PILLS FOR 2 DAYS, 1 PILL FOR 2 DAYS.  0  . ranitidine (ZANTAC) 150 MG capsule Take 150 mg by mouth 2 (two) times daily.     . ranitidine (ZANTAC) 150 MG tablet     . sertraline (ZOLOFT) 100 MG tablet Take 1 tablet (100 mg total) by mouth daily. 90 tablet 0  . sulfamethoxazole-trimethoprim (BACTRIM DS,SEPTRA DS) 800-160 MG tablet Take 2 tablets by mouth 2 (two) times daily. 40 tablet 0  . Suvorexant (BELSOMRA) 5 MG TABS Take 5 mg by mouth at bedtime. For sleep 30 tablet 1  . theophylline (UNIPHYL) 400 MG 24 hr tablet Take 400 mg by mouth daily.    Marland Kitchen tiotropium (SPIRIVA HANDIHALER) 18 MCG inhalation capsule Place 18 mcg into inhaler and inhale daily.     Marland Kitchen umeclidinium-vilanterol (ANORO ELLIPTA) 62.5-25 MCG/INH AEPB Inhale 1 puff into the lungs daily.     No current facility-administered medications for this visit.      Musculoskeletal: Strength & Muscle Tone: N/A Gait & Station: N/A Patient leans: N/A  Psychiatric Specialty Exam: ROS  There were no vitals taken for this visit.There is no height or weight on file to calculate BMI.  General Appearance: {Appearance:22683}  Eye Contact:  {BHH EYE CONTACT:22684}  Speech:  Clear and Coherent  Volume:  Normal  Mood:  {BHH MOOD:22306}  Affect:  {Affect (PAA):22687}  Thought Process:  Coherent  Orientation:  Full (Time, Place, and Person)  Thought Content: Logical   Suicidal Thoughts:  {ST/HT (PAA):22692}  Homicidal Thoughts:  {ST/HT (PAA):22692}  Memory:  Immediate;   Good  Judgement:  {Judgement (PAA):22694}  Insight:  {Insight (PAA):22695}  Psychomotor Activity:  Normal  Concentration:  Concentration: Good and Attention Span: Good  Recall:  Good  Fund of Knowledge: Good  Language: Good   Akathisia:  No  Handed:  Right  AIMS (if indicated): not done  Assets:  Communication Skills Desire for Improvement  ADL's:  Intact  Cognition: WNL  Sleep:  {BHH GOOD/FAIR/POOR:22877}   Screenings:   Assessment and Plan:  Assessment  Plan  The patient demonstrates the following risk factors for suicide: Chronic risk factors for suicide include: {Chronic Risk Factors for BHALPFX:90240973}. Acute risk factors for suicide include: {Acute Risk Factors for ZHGDJME:26834196}. Protective factors for this patient include: {Protective Factors for Suicide QIWL:79892119}. Considering these factors, the overall suicide risk at this point appears to be {Desc; low/moderate/high:110033}. Patient {ACTION; IS/IS ERD:40814481} appropriate for outpatient follow up.    Norman Clay, MD 02/25/2019, 9:41 AM

## 2019-03-03 ENCOUNTER — Ambulatory Visit (HOSPITAL_COMMUNITY): Payer: Medicare Other | Admitting: Psychiatry

## 2019-03-03 ENCOUNTER — Other Ambulatory Visit: Payer: Self-pay

## 2019-04-14 ENCOUNTER — Other Ambulatory Visit: Payer: Self-pay | Admitting: Psychiatry

## 2019-04-19 IMAGING — CT CT ABD-PELV W/ CM
2 of 5 series · 15 of 46 positions shown, 17 images · IV contrast (APPLIED)
Comparison: Abdominal CT dated 08/12/2015

CLINICAL DATA: 65-year-old female with abdominal pain. History of
ventral hernia. History of colon cancer status post surgery in 9443.

EXAM:
CT ABDOMEN AND PELVIS WITH CONTRAST
TECHNIQUE: Multidetector CT imaging of the abdomen and pelvis was performed
using the standard protocol following bolus administration of
intravenous contrast.
CONTRAST:  100mL 7JECZI-SXX IOPAMIDOL ([id number]-SXX) INJECTION 61%

[Series 2: axial st · axial · 0.82mm/px · z∈[-961,-601]mm · 12 of 82 slices shown, 14 images]
[im 5/82  soft-tissue]
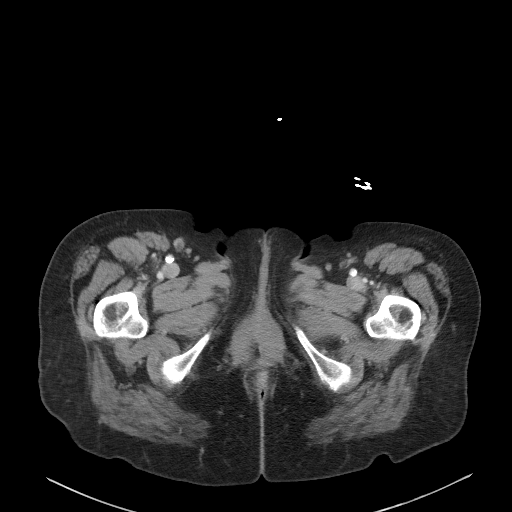
[im 5/82  bone]
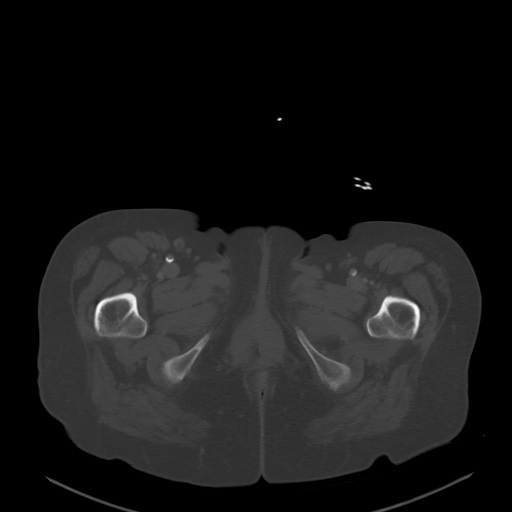
[im 13/82  soft-tissue]
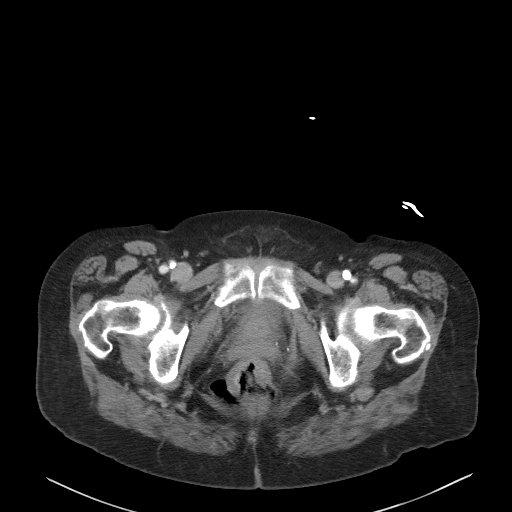
[im 18/82  soft-tissue]
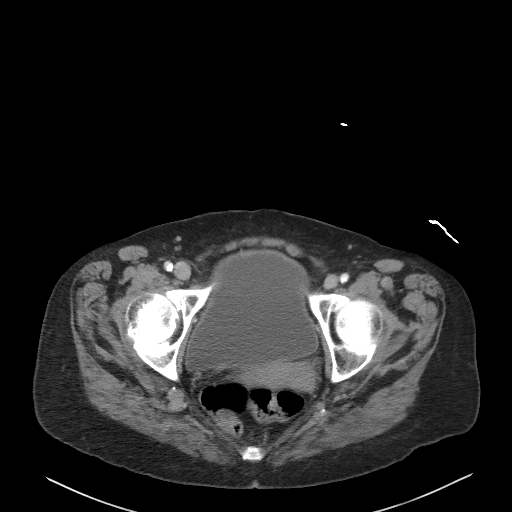
[im 26/82  soft-tissue]
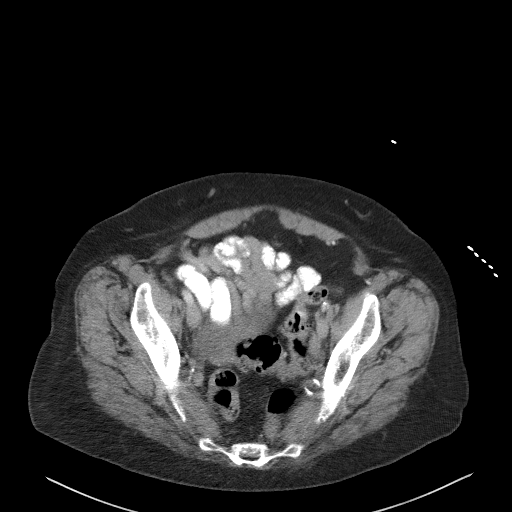
[im 30/82  soft-tissue]
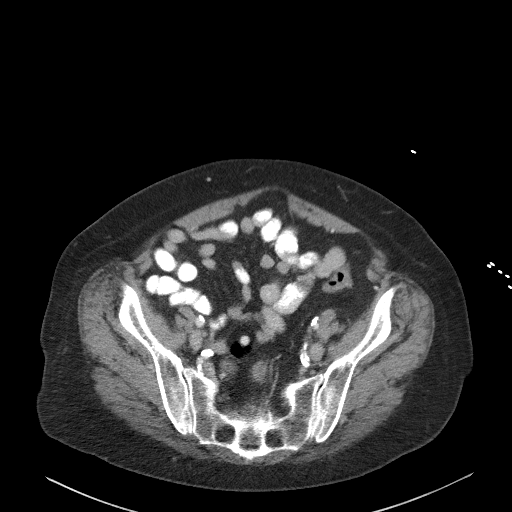
[im 39/82  soft-tissue]
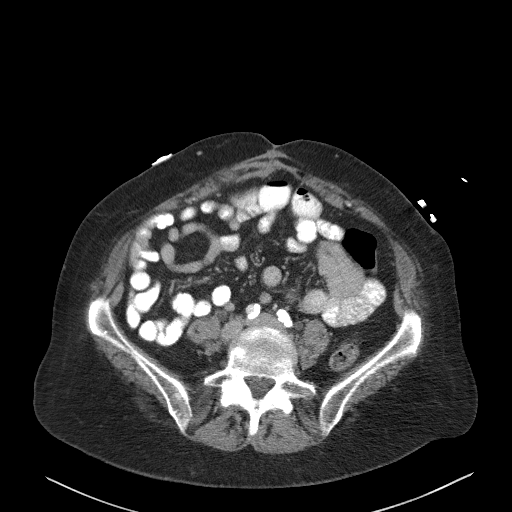
[im 43/82  soft-tissue]
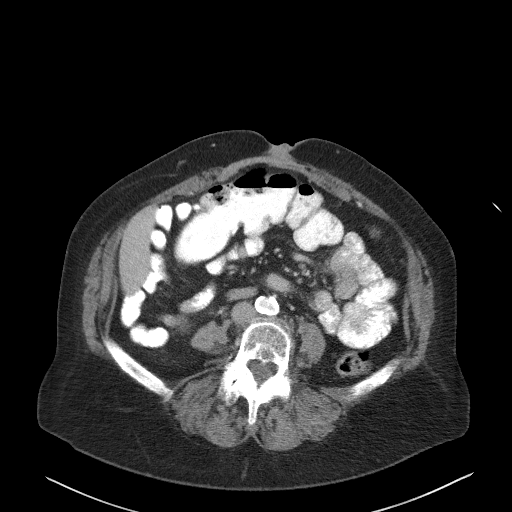
[im 52/82  soft-tissue]
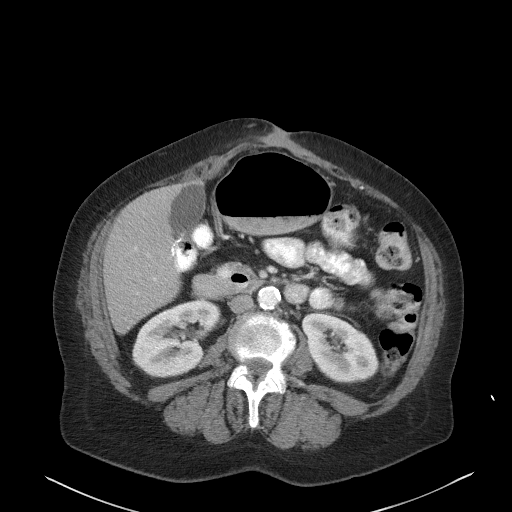
[im 56/82  soft-tissue]
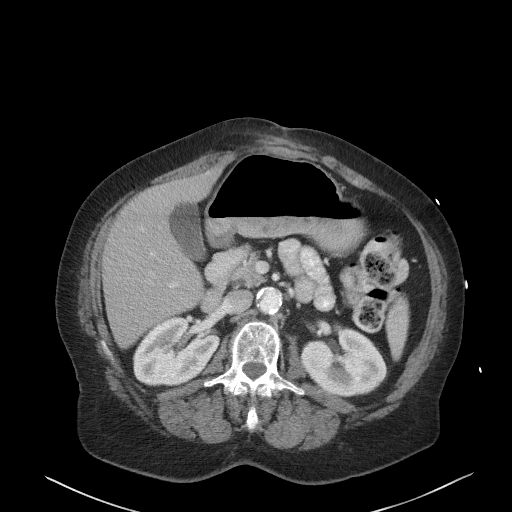
[im 56/82  bone]
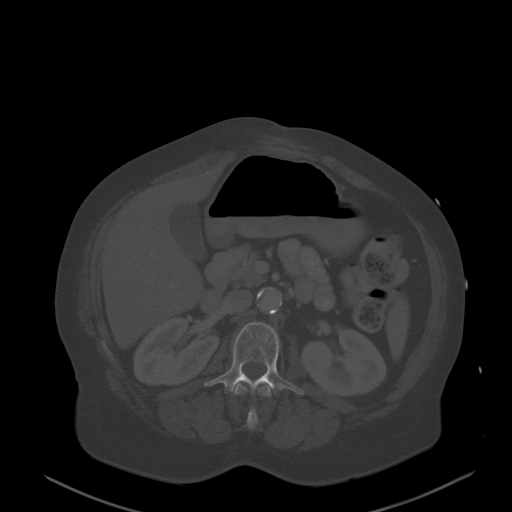
[im 64/82  soft-tissue]
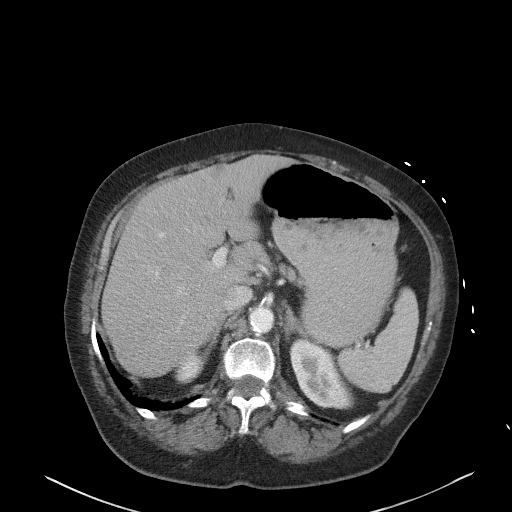
[im 69/82  soft-tissue]
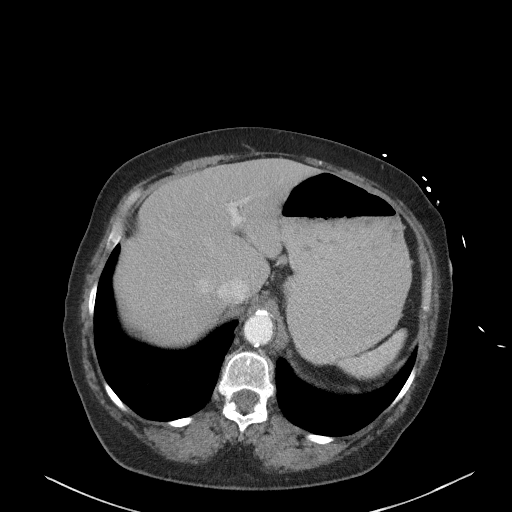
[im 77/82  soft-tissue]
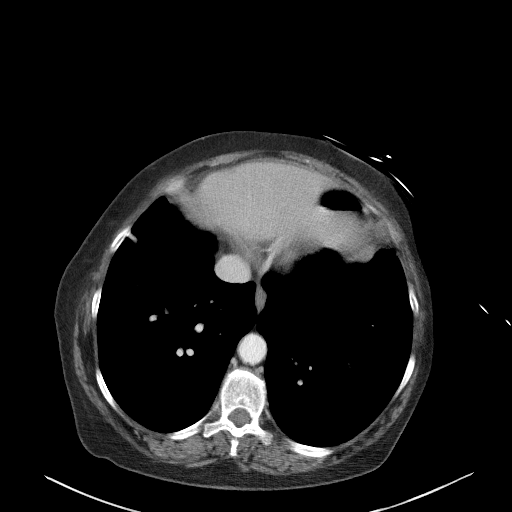

[Series 5: coronal st · coronal · 0.70mm/px · 3 of 98 slices shown]
[im 33/98  soft-tissue]
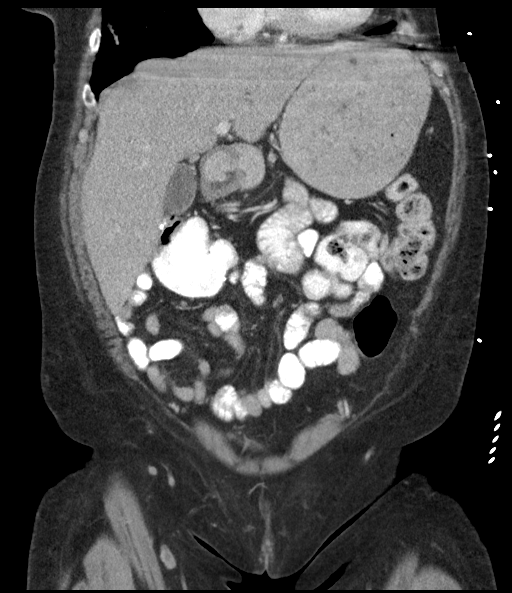
[im 44/98  soft-tissue]
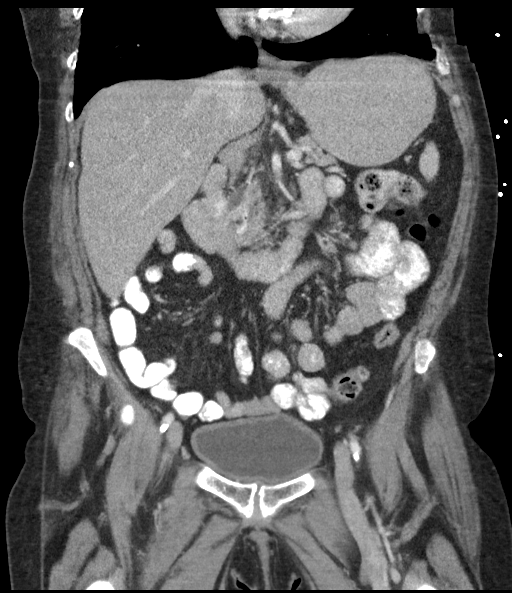
[im 54/98  soft-tissue]
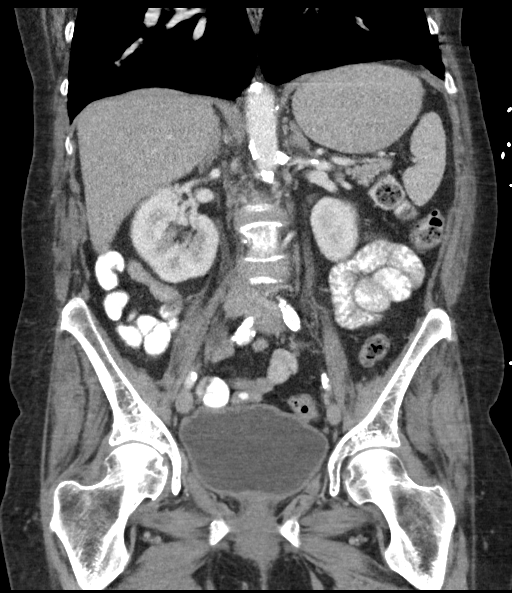

[15 of 46 positions shown; findings below may reference images not displayed]

FINDINGS: Lower chest: On the visualized lung bases are clear. There is
calcified mitral annulus.

No intra-abdominal free air or free fluid.

Hepatobiliary: No focal liver abnormality is seen. No gallstones,
gallbladder wall thickening, or biliary dilatation.

Pancreas: Unremarkable. No pancreatic ductal dilatation or
surrounding inflammatory changes.

Spleen: Normal in size without focal abnormality.

Adrenals/Urinary Tract: The adrenal glands are unremarkable. There
is renovascular calcification versus less likely a linear 6 mm
nonobstructing right renal upper pole stone. No obstructing calculi
noted. There is no hydronephrosis on either side. There is symmetric
enhancement and excretion of contrast by kidneys. The visualized
ureters and urinary bladder appear unremarkable.

Stomach/Bowel: There is postsurgical changes of right hemicolectomy
with ileotransverse anastomosis in the right upper quadrant. There
is no evidence of bowel obstruction or active inflammation. There
are sigmoid diverticulosis and scattered colonic diverticula without
active inflammation. Duodenal diverticula at the head of the
pancreas measure up to 1.6 cm. The appendix is surgically absent.

Vascular/Lymphatic: There is advanced aortoiliac atherosclerotic
disease. There is a 2.7 cm infrarenal aortic ectasia. The origins of
the celiac axis, SMA, IMA and the renal arteries are patent. The
SMV, splenic vein, and main portal vein are patent. No portal venous
gas identified. There is no adenopathy.

Reproductive: The uterus is small and may be atrophic or represent
partial hysterectomy. No pelvic masses.

Other: There is postsurgical changes of ventral hernia repair with
mesh. There is a 2.3 cm peritoneal defect with small fat containing
supraumbilical hernia to the right of the midline. There has been
interval resolution of the previously seen fluid collection/abscess
deep to the hernia repair. Residual density and the site of the
previously seen fluid collection likely represents scarring. There
is thickening of the skin and subcutaneous fat along the ventral
hernia repair. Small focal area of skin protrusion (series 2, image
29 and sagittal series 6, image [DATE] represent scarring, although
tiny fluid is not entirely excluded. Ultrasound may provide better
evaluation.

Musculoskeletal: Degenerative changes of the spine. No acute
fracture.
IMPRESSION: 1. Postsurgical changes of ventral hernia repair with thickening of
the skin and adjacent subcutaneous fat. No drainable fluid
collection/abscess identified. There has been interval resolution of
the previously seen fluid collection deep to the hernia repair site
with residual scarring. Small focus of skin protrusion may represent
scarring although a tiny fluid is not entirely excluded. Ultrasound
may provide better evaluation. There is a small fat containing right
supraumbilical hernia.
2. Postsurgical changes of partial colon resection with
ileotransverse anastomosis. No bowel obstruction or active
inflammation.
3. Diverticulosis of the sigmoid colon as well as to tibial. No
active inflammatory changes.
4. Renovascular calcification versus less likely a nonobstructing
right renal upper pole stone. No hydronephrosis.
5.  Aortic Atherosclerosis (ED07I-RHE.E).

## 2019-04-20 ENCOUNTER — Other Ambulatory Visit: Payer: Self-pay | Admitting: Psychiatry

## 2019-04-20 DIAGNOSIS — F411 Generalized anxiety disorder: Secondary | ICD-10-CM

## 2019-04-20 DIAGNOSIS — F331 Major depressive disorder, recurrent, moderate: Secondary | ICD-10-CM

## 2019-04-20 DIAGNOSIS — F410 Panic disorder [episodic paroxysmal anxiety] without agoraphobia: Secondary | ICD-10-CM

## 2019-04-28 ENCOUNTER — Other Ambulatory Visit (HOSPITAL_COMMUNITY): Payer: Self-pay | Admitting: Psychiatry

## 2019-04-28 ENCOUNTER — Telehealth: Payer: Self-pay

## 2019-04-28 DIAGNOSIS — F411 Generalized anxiety disorder: Secondary | ICD-10-CM

## 2019-04-28 DIAGNOSIS — F331 Major depressive disorder, recurrent, moderate: Secondary | ICD-10-CM

## 2019-04-28 DIAGNOSIS — F410 Panic disorder [episodic paroxysmal anxiety] without agoraphobia: Secondary | ICD-10-CM

## 2019-04-28 MED ORDER — HYDROXYZINE PAMOATE 25 MG PO CAPS: 25.0000 mg | ORAL_CAPSULE | Freq: Three times a day (TID) | ORAL | 0 refills | Status: AC | PRN

## 2019-04-28 MED ORDER — SERTRALINE HCL 100 MG PO TABS: 100.0000 mg | ORAL_TABLET | Freq: Every day | ORAL | 0 refills | Status: AC

## 2019-04-28 NOTE — Telephone Encounter (Signed)
This is a patient of Dr. Shea Evans. Patient's pharmacy called requesting a refill on her Sertraline 100mg  and her Hydroxyzine 25mg . [facility] [address] [facility]. Please review and advise. Thank you.

## 2019-04-28 NOTE — Telephone Encounter (Signed)
Ordered. Please make sure that she has follow up with Dr. Shea Evans.

## 2019-05-02 NOTE — Telephone Encounter (Signed)
This patient was referred to [facility] clinic , but I do not think she has followed through. Please call her and if she is still noncompliant , please discharge her. She had requested a referral to [facility] in April. Thanks.

## 2019-05-12 ENCOUNTER — Other Ambulatory Visit: Payer: Self-pay | Admitting: Psychiatry

## 2019-05-12 DIAGNOSIS — F331 Major depressive disorder, recurrent, moderate: Secondary | ICD-10-CM

## 2019-06-01 ENCOUNTER — Other Ambulatory Visit: Payer: Self-pay

## 2019-06-01 ENCOUNTER — Telehealth: Payer: Self-pay

## 2019-06-01 ENCOUNTER — Ambulatory Visit: Payer: Medicare Other | Admitting: Psychiatry

## 2019-06-01 NOTE — Telephone Encounter (Signed)
pt called states that she never received her phone call for her appt today. pt was explained that she was suppose to have been transfered to the [place] office. pt was also told that we had taken her off the schedule because you was suppose to be going to the [place] office. Pt was also told that Lea had called and left a message this morning.  Pt states she never got a call. Phone number was confirmed with what was in the system. Pt was advised to check her voicemails.  Pt stated that she was made an appt in [place] but that she called them and told them that she was going to stay at our office. Pt was told that no one contacted [place] about that.  Pt was also told that a message would have to be sent to Dr. Shea Evans because she had transferred you because she had been not compliant and that was one of the reason she was transferred out.

## 2019-06-01 NOTE — Telephone Encounter (Signed)
She has been noncompliant with recommendations and was referred to [facility] clinic in April - she also requested that at that visit.

## 2019-06-03 ENCOUNTER — Telehealth: Payer: Self-pay | Admitting: Internal Medicine

## 2019-06-03 NOTE — Telephone Encounter (Signed)
Called patient to schedule the Palliative Consult, no answer.  Left message with reason for call along with my contact information °

## 2019-06-06 ENCOUNTER — Telehealth: Payer: Self-pay | Admitting: Internal Medicine

## 2019-06-06 NOTE — Telephone Encounter (Signed)
Returned patient's call and after discussing Palliative services she was in agreement with this.  I have scheduled a Telephone Palliative Consult for 06/07/19 @ 11 AM.

## 2019-06-07 ENCOUNTER — Other Ambulatory Visit: Payer: Self-pay

## 2019-06-07 ENCOUNTER — Other Ambulatory Visit: Payer: Medicare Other | Admitting: Internal Medicine

## 2019-06-07 DIAGNOSIS — Z515 Encounter for palliative care: Secondary | ICD-10-CM

## 2019-06-07 NOTE — Progress Notes (Signed)
Sept 1st, 2020 [facility] Consult Note Telephone: [phone]  Fax: [phone]  Due to the current COVID-19 infection/crises, the family prefer, and have given their verbal consent for, a provider visit via telemedicine. HIPPA policies of confidentially were discussed. Video-audio (telehealth) contact was unable to be done due technical barriers from the patients side.  PATIENT NAME: Shirley Alvarado DOB: Jun 03, 1951 MRN: [id number] [address] [place], [place] [address] ([place] [phone]  PRIMARY CARE PROVIDER:   [facility]) Jonelle Sidle Dr. Garry Heater.  REFERRING PROVIDER:  Audie Pinto, PA-C [address] [facility] [place],  [place] [address]  RESPONSIBLE PARTY:  Gerhard Perches [facility]  [email or web address]. (son) Flossie Buffy [phone].  ASSESSMENT / RECOMMENDATIONS:  1. Advance Care Planning: a. Directives: Not reviewed this visit b. Goals of Care: Improve pain control and management of panic attacks.  2. Symptom Management:   A. Pain: Since 2005. Location mid anterior belly button area radiation around bilateral sides. Constant, twisting, wringing. Never pain free. Managed with Fentanyl patch 25 mcg/hr, with oxycodone 15mg  qid, which eases it up a bit and can be pain free for 30-40min, then back again.  Management by PCP Dr. Ardelle Anton ([facility]). Patient coping strategies: I have to deal with it; I cry and pray.   B. Panic attacks: Life-long but never this bad. Described as incapacitating. Agoraphobia; afraid to leave her home. Symptoms exacerbated over previous 6 months. She has lost her social supports of grandchildren (live in [place]) visits d/t COVID crisis. She also has lost the support of her church family. Medication management with Zoloft (increased to 150mg ) but wants something to help her calm down, to use once in a while. Reports previously seen  by Psych (at [facility]) but lost to follow up d/t patient relocation to [facility].  Referral f/u for psych.  f/u home visit; discuss cognitive behavioral techniques for panic attacks (deep breathing, mindfulness, focus object, muscle relaxation techniques, picturing her happy place, exercise, lavender, repeat a mantra).  Consider increase Zoloft to 200mg /day (from 150mg )  Avoid benzos concurrent use of high dose opioids   3.Cognitive / Functional status: Will review f/u home visit.  4. Family Supports: Widowed. Disabled (Medicare/Medicaid). [facility] ([phone]). [place] [id number]. Follow up Palliative Care Visit: Sept 3rd at 10am home visit  I spent 60 minutes providing this consultation from 11am to 12pm. More than 50% of the time in this consultation was spent coordinating communication.   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 Palliative Care was asked to assist with symptom management and address goals of care.   CODE STATUS: Plan to address nxt visit  PPS: Will evaluate on nxt visit.  HOSPICE ELIGIBILITY/DIAGNOSIS: TBD  PAST MEDICAL HISTORY:  Past Medical History:  Diagnosis Date   Anxiety    Basal cell carcinoma    Cancer (HCC)    COLON   Chronic pain    COPD (chronic obstructive pulmonary disease) (HCC)    Depression    GERD (gastroesophageal reflux disease)    Hypertension    Ventral hernia     SOCIAL HX:  Social History   Tobacco Use  Smoking status: Current Every Day Smoker    Packs/day: 0.50    Years: 50.00    Pack years: 25.00    Types: Cigarettes    Start date: 05/21/1970   Smokeless tobacco: Never Used   Tobacco  comment: However patient relates that she smoked for 50 years and at one point she was up to 2 packs per day  Substance Use Topics   Alcohol use: Yes    Alcohol/week: 2.0 standard drinks    Types: 2 Cans of beer per week    ALLERGIES:  Allergies  Allergen Reactions   Minocycline Anaphylaxis    Throat swelling   Levofloxacin Hives     PERTINENT MEDICATIONS:  Outpatient Encounter Medications as of 06/07/2019  Medication Sig   albuterol (PROAIR HFA) 108 (90 BASE) MCG/ACT inhaler Inhale 2 puffs into the lungs every 6 (six) hours as needed.    amLODipine (NORVASC) 10 MG tablet Take 10 mg by mouth daily.    aspirin EC 81 MG tablet Take 81 mg by mouth daily.   atorvastatin (LIPITOR) 10 MG tablet TAKE 1 TABLET BY MOUTH EVERY DAY   cetirizine (ZYRTEC) 10 MG tablet TAKE 1 TABLET BY MOUTH EVERY DAY AT NIGHT   clopidogrel (PLAVIX) 75 MG tablet Take 2 tablets (150 mg total) by mouth once for 1 dose.   fentaNYL (DURAGESIC - DOSED MCG/HR) 25 MCG/HR patch Place 25 mcg onto the skin every 3 (three) days.   liothyronine (CYTOMEL) 50 MCG tablet Take 50 mcg by mouth daily.   loratadine (CLARITIN) 10 MG tablet Take 10 mg by mouth daily.    meloxicam (MOBIC) 15 MG tablet Take 15 mg by mouth daily.   mometasone (NASONEX) 50 MCG/ACT nasal spray Place 1 spray into the nose daily.   montelukast (SINGULAIR) 10 MG tablet TAKE 1 TABLET BY MOUTH DAILY   naloxone (NARCAN) nasal spray 4 mg/0.1 mL 4 mg (1 nasal spray) prn for opioid overdose; repeat every 2 to 3 minutes in alternating nostrils until medical assistance available   nystatin cream (MYCOSTATIN) Apply topically.   oxyCODONE (ROXICODONE) 15 MG immediate release tablet Take by mouth.   OXYGEN Inhale 2 L/min into the lungs.   predniSONE (DELTASONE) 10 MG tablet Take 5 mg by mouth daily. Currently 5mg  qd   sertraline (ZOLOFT) 100 MG tablet Take 1 tablet (100 mg total) by mouth daily. (Patient taking differently: Take 150 mg by mouth  daily. Patent reports taking 150mg /d)   theophylline (UNIPHYL) 400 MG 24 hr tablet Take 400 mg by mouth daily.   umeclidinium-vilanterol (ANORO ELLIPTA) 62.5-25 MCG/INH AEPB Inhale 1 puff into the lungs daily.   BELSOMRA 5 MG TABS TAKE 1 TABLET BY MOUTH AT BEDTIME. FOR SLEEP (Patient not taking: Reported on 06/09/2019)   Fluticasone-Salmeterol (ADVAIR DISKUS) 250-50 MCG/DOSE AEPB Inhale 1 puff into the lungs 2 (two) times daily.    hydrOXYzine (VISTARIL) 25 MG capsule Take 1 capsule (25 mg total) by mouth 3 (three) times daily as needed (for severe anxiety symptoms). (Patient not taking: Reported on 06/09/2019)   liothyronine (CYTOMEL) 50 MCG tablet Take 50 mcg by mouth daily.    losartan (COZAAR) 100 MG tablet Take by mouth.   losartan-hydrochlorothiazide (HYZAAR) 100-25 MG tablet Take by mouth.   ranitidine (ZANTAC) 150 MG capsule Take 150 mg by mouth 2 (two) times daily.    ranitidine (ZANTAC) 150 MG tablet    sulfamethoxazole-trimethoprim (BACTRIM DS,SEPTRA DS) 800-160 MG tablet Take 2 tablets by mouth 2 (two) times daily. (Patient  not taking: Reported on 06/09/2019)   tiotropium (SPIRIVA HANDIHALER) 18 MCG inhalation capsule Place 18 mcg into inhaler and inhale daily.    No facility-administered encounter medications on file as of 06/07/2019.     PHYSICAL EXAM:   PE deferred d/t audio only telehealth visit. Patient pleasantly conversant, oriented, without dyspnea with conversation.  Julianne Handler, NP

## 2019-06-09 ENCOUNTER — Encounter: Payer: Self-pay | Admitting: Internal Medicine

## 2019-06-09 ENCOUNTER — Other Ambulatory Visit: Payer: Medicare Other | Admitting: Internal Medicine

## 2019-06-09 ENCOUNTER — Other Ambulatory Visit: Payer: Self-pay

## 2019-06-09 DIAGNOSIS — Z515 Encounter for palliative care: Secondary | ICD-10-CM

## 2019-06-09 NOTE — Progress Notes (Signed)
Sept 3rd, 2020 [facility] Consult Note Telephone: [phone]  Fax: [phone]   Due to the current COVID-19 infection/crises, the family prefer, and have given their verbal consent for, a provider visit via telemedicine. HIPPA policies of confidentially were discussed. Video-audio (telehealth) contact was unable to be done due technical barriers from the patient's side.   PATIENT NAME: Shirley Alvarado DOB: 1950-11-12 MRN: [id number] [address] [place], [place] [address] ([place] [phone]   PRIMARY CARE PROVIDER:   [facility]) Jonelle Sidle Dr. Garry Heater.   REFERRING PROVIDER:  Audie Pinto, PA-C [address] [place] [address],  [place] [address]   RESPONSIBLE PARTY:  Neale Burly [facility]  [email or web address]. (son) Flossie Buffy [phone]. [place] [phone]   ASSESSMENT / RECOMMENDATIONS:  1. Advance Care Planning: a. Directives: Not reviewed this visit b. Goals of Care: Improve pain control and management of panic attacks.    2. Symptom Management:  A. Dyspnea (COPD): On chronic home oxygen since 2005. She toggles between 2 to 3.5 L nasal cannula depending on activity level or if experiencing a panic attack. Chronic cough productive of greenish secretion; averages 1-2 tbsp / 24 hr. Her pulse oximeter is 94% consistently, but can desaturate to 83% during a panic attack or increased congestion. She manages these times with applying cold water on face and use of a fan. Patient continues to smoke. She knows not to smoke while wearing nasal canula. I emphasized and reviewed the dangers of this to her.            B. Pain: Chronic since 2005; mid anterior "belly button area" with radiation around bilateral sides. Described as constant, twisting, and wringing. Never pain free. Pain management by PCP Dr. Ardelle Anton ([facility]). Managed with Fentanyl patch 25 mcg/hr, with  oxycodone 15mg  qid, which "eases it up a bit and can be pain free for 30-27min, then back again".  Patient coping strategies include "I have to deal with it; I cry and pray".              C. Panic attacks: Lifelong but seems to be escalating in intensity over these last 6 months and are described as incapacitating. She feels unable to leave her home. She has lost her social supports of grandchildren (live in [place]) visiting and of the support of her church family, d/t COVID crisis. Medication management with Zoloft (increased to 150mg ) but "wants something to help her calm down, to use once in a while". Reports previously seen by Psych (at [facility]) but lost to follow up d/t patient relocation to [facility].  Referral f/u for psych.  f/u home visit; continue discussions cognitive behavioral techniques for panic attacks (deep breathing, mindfulness, focus object, muscle relaxation techniques, picturing her "happy place", exercise, repeat a mantra).   Consider increase Zoloft to 200mg /day (from 150mg ). Last dose adjustment per patient was 6 months earlier.  Avoid benzos with concurrent use of high dose opioids. I discussed this with patient, who was understanding.   3. Family Supports:  Patient is widowed. She has 2 supportive sons Shirley Alvarado and Shirley Alvarado ([place]). Patient's sister Shirley Alvarado lives here in her home (trailer rental) off and on. Patient is disabled Environmental education officer). [facility] ([phone]). She is followed by [facility] [phone] for PT services. 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 Patient relaxes by watching TV Shirley Alvarado).  4. Follow up Palliative Care Visit: Tues Oct 20th at 10:30am   I spent 60 minutes providing this consultation from 11am to 12pm. More than 50% of the  time in this consultation was spent coordinating communication.    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 Palliative Care was asked to assist with symptom management and address goals of care.    CODE STATUS: Plan to address nxt visit   PPS: 40%   HOSPICE ELIGIBILITY/DIAGNOSIS: TBD  PAST MEDICAL HISTORY:  Past Medical History:  Diagnosis Date  . Anxiety   . Basal cell carcinoma   . Cancer (HCC)    COLON  . Chronic pain   . COPD (chronic obstructive pulmonary disease) ([place])   . Depression   . GERD (gastroesophageal reflux disease)   . Hypertension   . Ventral hernia     SOCIAL HX:  Social History   Tobacco Use  . Smoking status: Current Every Day Smoker    Packs/day: 0.50    Years: 50.00    Pack years: 25.00    Types: Cigarettes    Start date: 05/21/1970  . Smokeless tobacco: Never Used  . Tobacco comment: However patient relates that she smoked for 50 years and at one point she was up to 2 packs per day  Substance Use Topics  . Alcohol use: Yes    Alcohol/week: 2.0 standard drinks    Types: 2 Cans of beer per week    ALLERGIES:  Allergies  Allergen Reactions  . Minocycline Anaphylaxis    Throat swelling  . Levofloxacin Hives     PERTINENT MEDICATIONS:  Outpatient Encounter Medications as of 67/12/2018  Medication Sig  . acetaminophen (TYLENOL) 325 MG tablet Take 325 mg by mouth every 6 (six) hours as needed. Take 1-2 tabs with each dose of oxy no more than qid  . albuterol (PROAIR HFA) 108 (90 BASE) MCG/ACT inhaler Inhale 2 puffs into the lungs every 6 (six) hours as needed.   Marland Kitchen amLODipine (NORVASC) 10 MG tablet Take 10 mg by mouth daily.   Marland Kitchen aspirin EC 81 MG tablet Take 81 mg by mouth daily.  Marland Kitchen atorvastatin  (LIPITOR) 10 MG tablet TAKE 1 TABLET BY MOUTH EVERY DAY  . BELSOMRA 5 MG TABS TAKE 1 TABLET BY MOUTH AT BEDTIME. FOR SLEEP (Patient not taking: Reported on 06/09/2019)  . cetirizine (ZYRTEC) 10 MG tablet TAKE 1 TABLET BY MOUTH EVERY DAY AT NIGHT  . clopidogrel (PLAVIX) 75 MG tablet Take 2 tablets (150 mg total) by mouth once for 1 dose.  . fentaNYL (DURAGESIC - DOSED MCG/HR) 25 MCG/HR patch Place 25 mcg onto the skin every 3 (three) days.  . Fluticasone-Salmeterol (ADVAIR DISKUS) 250-50 MCG/DOSE AEPB Inhale 1 puff into the lungs 2 (two) times daily.   . hydrOXYzine (VISTARIL) 25 MG capsule Take 1 capsule (25 mg total) by mouth 3 (three) times daily as needed (for severe anxiety symptoms). (Patient not taking: Reported on 06/09/2019)  . liothyronine (CYTOMEL) 50 MCG tablet Take 50 mcg by mouth daily.   Marland Kitchen liothyronine (CYTOMEL) 50 MCG tablet Take 50 mcg by mouth daily.  Marland Kitchen loratadine (CLARITIN) 10  MG tablet Take 10 mg by mouth daily.   Marland Kitchen losartan (COZAAR) 100 MG tablet Take by mouth.  . losartan-hydrochlorothiazide (HYZAAR) 100-25 MG tablet Take by mouth.  . meloxicam (MOBIC) 15 MG tablet Take 15 mg by mouth daily.  . mometasone (NASONEX) 50 MCG/ACT nasal spray Place 1 spray into the nose daily.  . montelukast (SINGULAIR) 10 MG tablet TAKE 1 TABLET BY MOUTH DAILY  . naloxone (NARCAN) nasal spray 4 mg/0.1 mL 4 mg (1 nasal spray) prn for opioid overdose; repeat every 2 to 3 minutes in alternating nostrils until medical assistance available  . nystatin cream (MYCOSTATIN) Apply topically.  Marland Kitchen oxyCODONE (ROXICODONE) 15 MG immediate release tablet Take by mouth.  . OXYGEN Inhale 2 L/min into the lungs.  . predniSONE (DELTASONE) 10 MG tablet Take 5 mg by mouth daily. Currently 5mg  qd  . ranitidine (ZANTAC) 150 MG capsule Take 150 mg by mouth 2 (two) times daily.   . ranitidine (ZANTAC) 150 MG tablet   . sertraline (ZOLOFT) 100 MG tablet Take 1 tablet (100 mg total) by mouth daily. (Patient taking differently:  Take 150 mg by mouth daily. Patent reports taking 150mg /d)  . sulfamethoxazole-trimethoprim (BACTRIM DS,SEPTRA DS) 800-160 MG tablet Take 2 tablets by mouth 2 (two) times daily. (Patient not taking: Reported on 06/09/2019)  . theophylline (UNIPHYL) 400 MG 24 hr tablet Take 400 mg by mouth daily.  Marland Kitchen tiotropium (SPIRIVA HANDIHALER) 18 MCG inhalation capsule Place 18 mcg into inhaler and inhale daily.   Marland Kitchen umeclidinium-vilanterol (ANORO ELLIPTA) 62.5-25 MCG/INH AEPB Inhale 1 puff into the lungs daily.   No facility-administered encounter medications on file as of 06/09/2019.     PHYSICAL EXAM:   Well nourished; looks older than stated age of 21. Mild dyspnea with conversation. O2 2LPM inplace Majority of PE defered d/t COVID precautions. Her sister Shirley Alvarado is present.  General: NAD, frail appearing, thin Pulmonary: able to converse with only minimal dyspnea Extremities: no edema, no joint deformities Skin: no rashes exposed areas Neurological: Weakness but otherwise nonfocal  Julianne Handler, NP

## 2019-06-23 ENCOUNTER — Telehealth: Payer: Self-pay | Admitting: Internal Medicine

## 2019-06-23 NOTE — Telephone Encounter (Signed)
(  Late entry form 06/22/2019) TC: 5:15pm: Patient called feeling upset and in distress. She reports that she had a recent visit with her pulmonologist, and he shared that she had stage 4 lung disease. She mentions she is feeling shocked. She had anticipated that, though she recognized that she was having increasing difficulty with dyspnea with activities, that this may have been d/t a respiratory infection which would have responded to antibiotics or would improve with a course of steroids. Her understanding now is that her options are very limited. Dr. Vella Kohler has changed her inhaler and will see her in follow up in 6 weeks.  Patient mentions she can speak openly to her children and other relatives regarding her illness and this distressing news, and that they are a huge source of support for her.  I provided supportive listening. We have a Zoom meeting scheduled for Tues Oct 20th at 10:30am.  She deferred me visiting sooner but has my contact information should she wish to schedule sooner. Shirley Gelinas NP-C [facility] (Palliative) [phone]

## 2019-06-24 ENCOUNTER — Telehealth: Payer: Self-pay | Admitting: Internal Medicine

## 2019-06-24 NOTE — Telephone Encounter (Signed)
4pm: TC from patient who is requesting a DNR form to be sent to her home. She mentions that she had a DNR. We discussed that DNR means Do Not Resuscitate in the event of a cardiopulmonary arrest. She understands. I will complete 2 forms, up load a copy into the [facility], and mail to patient.  Violeta Gelinas NP-C [facility] [phone]

## 2019-06-28 ENCOUNTER — Telehealth: Payer: Self-pay | Admitting: Internal Medicine

## 2019-06-28 NOTE — Telephone Encounter (Signed)
9:45 am TC from patient requesting earlier appointment to discuss results of recent pulmonology visit. She is distresses that disease is more sever than she realized.  Scheduled for this Friday Sept 25th at [facility] NP-C [phone]

## 2019-07-01 ENCOUNTER — Other Ambulatory Visit: Payer: Medicare Other | Admitting: Internal Medicine

## 2019-07-01 ENCOUNTER — Other Ambulatory Visit: Payer: Self-pay

## 2019-07-01 ENCOUNTER — Encounter: Payer: Self-pay | Admitting: Internal Medicine

## 2019-07-01 DIAGNOSIS — Z515 Encounter for palliative care: Secondary | ICD-10-CM

## 2019-07-01 NOTE — Progress Notes (Addendum)
Sept 25th, 2020 [facility] Palliative Care Consult Note Telephone: [phone]  Fax: [phone]    PATIENT NAME: Shirley Alvarado DOB: 11-10-1950 MRN: [id number] [address] [place], [place] [address] ([phone]   PRIMARY CARE PROVIDER:   Hillis Range ([place]) [address] [place], [place] [address]  [phone] Dr. Freda Munro. [facility] (Pulmonary) [facility], [place], [place] [address] [phone]   REFERRING PROVIDER:  Audie Pinto, PA-C [address],   [phone]   RESPONSIBLE PARTY:  Neale Burly [facility]  [email or web address]. (son) Flossie Buffy [phone]. Kieth Brightly [phone]. (son) Candice Camp [id number] (M)   ASSESSMENT / RECOMMENDATIONS:  1. Advance Care Planning: A. Directives: DNR present in [place]. Patient lost her form; I completed 2 new ones and left them with her in the home. I advised her to place one copy on the fridge and carry the other with her when she left the home. MOST completed this visit. MOST details: DNR/DNI, limited scope of medical interventions (avoid ICU/no intubation), yes to antibiotics and IVFs, no to tube feedings. Forms uploaded into [facility].  B. Goals of Care: Patient states her pulmonologist Dr. Raul Del shared with her that she has severe end stage lung disease and that she should expect a progression in symptoms. We discussed her goals; she wishes comfort care. She is familiar with Hospice services (family members have been on in the past) and would be interested in a referral for herself. I left a voice message with her Pulmonologist to check if he felt she was eligible for Hospice services (estimated end of life 6 months or less if her disease continues on its expected trajectory). Also left a message for her PCP Stefano Gaul PA, asking, if patient is referred for hospice, if Ms. Ardelle Anton would be  agreeable to continue as patient's attending under Hospice program.    2. Symptom Management:  A. Dyspnea (COPD): She feels she is coughing a little less; still productive (chronic) greenish secretions. Continues on O2 toggles between 2-3.5 L depending on activity level and level of anxiety. Drinks lemon water/honey which seems to loosen up her cough. Her pulse oximeter is 94% consistently but can desaturate to 83% during a panic attack or increased congestion. She manages these times with applying cold water on face and use of a fan. Patient continues to smoke but has decreased to 1-2 cigarettes/day.  He practices pursed lip breathing. -? Acapella Flutter Valve; will ask Dr. Vella Kohler   B. Pain: Chronic since 2005; Continues mid anterior "belly button area" with radiation around bilateral sides. Described as constant, twisting, and wringing. Never pain free. Pain management by PCP Dr. Ardelle Anton ([facility]). Managed with Fentanyl patch 25 mcg/hr, with oxycodone 15mg  4-5 times/day, which "eases it up a bit and can be pain free for 30-45min, then back again".  Patient coping strategies include "I have to deal with it; I cry and pray". Luckily no opioid induced constipation. She is moving her bowels on a daily basis; diet rich in fruit.              C. Panic attacks: Lifelong but escalating in intensity can be incapacitating. She feels unable to leave her home. She has lost her social supports of grandchildren (live in [place]) visiting and of the support of her church family, d/t COVID crisis. Her Zoloft has be increased to 200mg  /s.  She "wants something to help her calm down, to use once in a while" but realizes danger of benzodiazepines setting of opioid use. Peviously seen by Psych (at [facility]) but lost to follow up d/t patient relocation to [facility]. She has been utilizing some of the behavioral techniques (deep breathing, mindfulness, focus object, muscle relaxation techniques, picturing  her "happy place", exercise, repeat a mantra) but it's been difficult.  -I will check with PCP for psych referral -f/u home visit; continue discussions cognitive behavioral techniques for panic attacks    3. Family Supports (from previous note):  Patient is widowed. She has 2 supportive sons Ernie and Eddie ([place]). Patient's sister Kieth Brightly lives here in her home (trailer rental) off and on. Patient is disabled Environmental education officer). Her pharmacy [facility] provides a pill pack (1 [phone]. [facility] provides CNA ([place]) 2.75 hours/d, 7d/week; assists with breakfast and with personal care. 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 Patient relaxes by watching TV Tempie Donning).   4. Follow up Palliative Care Visit: pending approval of PCP/pulmonologist hospice referral.   I spent 90 minutes providing this consultation from 11:30am to 1pm. More than 50% of the time in this consultation was spent coordinating communication.    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 This is a f/u Palliative Care visit from Sept 3rd.    CODE STATUS: DNR/DNI   PPS: 40%   HOSPICE ELIGIBILITY/DIAGNOSIS: yes/end stage COPD  PAST MEDICAL HISTORY:  Past Medical History:  Diagnosis Date  . Anxiety   . Basal cell carcinoma   . Cancer (HCC)    COLON  . Chronic pain   . COPD (chronic obstructive pulmonary disease) ([place])   . Depression   . GERD (gastroesophageal reflux disease)   . Hypertension   . Ventral hernia     SOCIAL HX:  Social History   Tobacco Use  . Smoking status: Current Every Day Smoker    Packs/day: 0.50    Years: 50.00    Pack years: 25.00    Types: Cigarettes    Start date: 05/21/1970  . Smokeless  tobacco: Never Used  . Tobacco comment: However patient relates that she smoked for 50 years and at one point she was up to 2 packs per day  Substance Use Topics  . Alcohol use: Yes    Alcohol/week: 2.0 standard drinks    Types: 2 Cans of beer per week    ALLERGIES:  Allergies  Allergen Reactions  . Minocycline Anaphylaxis    Throat swelling  . Levofloxacin Hives     PERTINENT MEDICATIONS:  Outpatient Encounter Medications as of 07/01/2019  Medication Sig  . acetaminophen (TYLENOL) 325 MG tablet Take 325 mg by mouth every 6 (six) hours as needed. Take 1-2 tabs with each dose of oxy no more than qid  . albuterol (PROAIR HFA) 108 (90 BASE) MCG/ACT inhaler Inhale 2 puffs into the lungs every 6 (six) hours as needed.   Marland Kitchen amLODipine (NORVASC) 10 MG tablet Take 10 mg by mouth daily.   Marland Kitchen aspirin EC 81 MG tablet Take 81 mg by mouth daily.  Marland Kitchen atorvastatin (LIPITOR) 10 MG tablet TAKE 1 TABLET BY MOUTH EVERY DAY  . clopidogrel (PLAVIX) 75 MG tablet Take 2  tablets (150 mg total) by mouth once for 1 dose.  . fentaNYL (DURAGESIC - DOSED MCG/HR) 25 MCG/HR patch Place 25 mcg onto the skin every 3 (three) days.  Marland Kitchen liothyronine (CYTOMEL) 50 MCG tablet Take 50 mcg by mouth daily.  Marland Kitchen loratadine (CLARITIN) 10 MG tablet Take 10 mg by mouth daily.   . meloxicam (MOBIC) 15 MG tablet Take 15 mg by mouth daily.  . mometasone (NASONEX) 50 MCG/ACT nasal spray Place 1 spray into the nose daily.  . montelukast (SINGULAIR) 10 MG tablet TAKE 1 TABLET BY MOUTH DAILY  . naloxone (NARCAN) nasal spray 4 mg/0.1 mL 4 mg (1 nasal spray) prn for opioid overdose; repeat every 2 to 3 minutes in alternating nostrils until medical assistance available  . nystatin cream (MYCOSTATIN) Apply topically.  Marland Kitchen oxyCODONE (ROXICODONE) 15 MG immediate release tablet Take by mouth.  . OXYGEN Inhale 2 L/min into the lungs.  . predniSONE (DELTASONE) 10 MG tablet Take 5 mg by mouth daily. Currently 5mg  qd  . sertraline (ZOLOFT) 100 MG tablet  Take 1 tablet (100 mg total) by mouth daily. (Patient taking differently: Take 200 mg by mouth daily. 200mg   (2 of the 100mg  tabs) qd)  . theophylline (UNIPHYL) 400 MG 24 hr tablet Take 400 mg by mouth daily.  Marland Kitchen tiotropium (SPIRIVA HANDIHALER) 18 MCG inhalation capsule Place 18 mcg into inhaler and inhale daily.   . [DISCONTINUED] cetirizine (ZYRTEC) 10 MG tablet TAKE 1 TABLET BY MOUTH EVERY DAY AT NIGHT  . Fluticasone-Salmeterol (ADVAIR DISKUS) 250-50 MCG/DOSE AEPB Inhale 1 puff into the lungs 2 (two) times daily.   . hydrOXYzine (VISTARIL) 25 MG capsule Take 1 capsule (25 mg total) by mouth 3 (three) times daily as needed (for severe anxiety symptoms). (Patient not taking: Reported on 06/09/2019)  . liothyronine (CYTOMEL) 50 MCG tablet Take 50 mcg by mouth daily.   Marland Kitchen losartan (COZAAR) 100 MG tablet Take by mouth.  . losartan-hydrochlorothiazide (HYZAAR) 100-25 MG tablet Take by mouth.  . [DISCONTINUED] BELSOMRA 5 MG TABS TAKE 1 TABLET BY MOUTH AT BEDTIME. FOR SLEEP (Patient not taking: Reported on 06/09/2019)  . [DISCONTINUED] ranitidine (ZANTAC) 150 MG capsule Take 150 mg by mouth 2 (two) times daily.   . [DISCONTINUED] ranitidine (ZANTAC) 150 MG tablet   . [DISCONTINUED] sulfamethoxazole-trimethoprim (BACTRIM DS,SEPTRA DS) 800-160 MG tablet Take 2 tablets by mouth 2 (two) times daily. (Patient not taking: Reported on 06/09/2019)  . [DISCONTINUED] umeclidinium-vilanterol (ANORO ELLIPTA) 62.5-25 MCG/INH AEPB Inhale 1 puff into the lungs daily.   No facility-administered encounter medications on file as of 07/01/2019.     PHYSICAL EXAM:   Well nourished; looks older than stated age of 33. Dyspnea with conversation. O2 2LPM in place Majority of PE defered d/t COVID precautions. Her sister Kieth Brightly is present.  General: NAD, frail appearing, thin Pulmonary: able to converse with only minimal dyspnea Extremities: no edema, no joint deformities Skin: no rashes exposed areas Neurological: Weakness but  otherwise non-focal  Julianne Handler, NP

## 2019-07-04 ENCOUNTER — Other Ambulatory Visit: Payer: Medicare Other | Admitting: Internal Medicine

## 2019-07-05 ENCOUNTER — Encounter: Payer: Self-pay | Admitting: Internal Medicine

## 2019-07-05 NOTE — Progress Notes (Signed)
Sept 29th, 2020 [facility] Palliative Care Consult Note Telephone: [phone]  Fax: [phone]     Due to the current COVID-19 infection/crises, the family prefer, and have given their verbal consent for, a provider visit via telemedicine. HIPPA policies of confidentially were discussed. Video-audio (telehealth) contact was unable to be done due technical barriers from the patient's side.  PATIENT NAME: Shirley Alvarado DOB: [birth year] MRN: [id number] [address] [place], [place] [address] ([phone]   PRIMARY CARE PROVIDER:   Hillis Range ([place])   [address], [place], [place] [address]  [phone] Dr. Freda Munro. [facility] (Pulmonary) [facility] [place], [place], [place] [address] [phone]   REFERRING PROVIDER:  Audie Pinto, PA-C [address],  [place] [address]   RESPONSIBLE PARTY:  Neale Burly [facility]  [email or web address]. (son) Flossie Buffy [phone]. Kieth Brightly [phone]. (son) Candice Camp [id number] (M)   ASSESSMENT / RECOMMENDATIONS:  1. Advance Care Planning: A. Directives: DNR in home; MOST details: DNR/DNI, limited scope of medical interventions (avoid ICU/no intubation), yes to antibiotics and IVFs, no to tube feedings.   B. Goals of Care: Symptom management anxiety/dyspnea. Desire to avoid hospitalizations.   Patient interested in hospice referral d/t poor prognosis shared by her pulmonologist. We discussed scope of hospice services. Dr. Gust Brooms office called me back today and confirmed that in his opinion patient meets hospice eligibility. I checked with patient's PCP Stefano Gaul PA. Ms. Ardelle Anton is agreeable for referral and will continue to act as patient's attending physician under hospice services.    2. Follow up: Scheduled for Admitting Visit by Hospice RN 06/07/2019 @ 11am.   I spent 90 minutes providing this  consultation from 11:30am to 1pm. More than 50% of the time in this consultation was spent coordinating communication.    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 This is a f/u Palliative Care visit from Sept 25th.    CODE STATUS: DNR/DNI   PPS: 40%   HOSPICE ELIGIBILITY/DIAGNOSIS: yes/end stage COPD  PAST MEDICAL HISTORY:  Past Medical History:  Diagnosis Date  . Anxiety   . Basal cell carcinoma   . Cancer (HCC)    COLON  . Chronic pain   . COPD (chronic obstructive pulmonary disease) ([place])   . Depression   . GERD (gastroesophageal reflux disease)   . Hypertension   . Ventral hernia     SOCIAL HX:  Social History   Tobacco Use  . Smoking status: Current Every Day Smoker    Packs/day: 0.50    Years: 50.00    Pack years: 25.00    Types: Cigarettes    Start date: 05/21/1970  . Smokeless tobacco: Never Used  . Tobacco comment: However patient relates that she smoked for 50 years and at one point she was up to 2 packs per day  Substance Use Topics  . Alcohol use: Yes    Alcohol/week: 2.0 standard drinks    Types: 2 Cans of beer per week    ALLERGIES:  Allergies  Allergen Reactions  . Minocycline Anaphylaxis    Throat swelling  . Levofloxacin Hives     PERTINENT MEDICATIONS:  Outpatient  Encounter Medications as of 07/04/2019  Medication Sig  . acetaminophen (TYLENOL) 325 MG tablet Take 325 mg by mouth every 6 (six) hours as needed. Take 1-2 tabs with each dose of oxy no more than qid  . albuterol (PROAIR HFA) 108 (90 BASE) MCG/ACT inhaler Inhale 2 puffs into the lungs every 6 (six) hours as needed.   Marland Kitchen amLODipine (NORVASC) 10 MG tablet Take 10 mg by mouth daily.   Marland Kitchen aspirin EC 81 MG tablet Take 81 mg by mouth daily.   Marland Kitchen atorvastatin (LIPITOR) 10 MG tablet TAKE 1 TABLET BY MOUTH EVERY DAY  . clopidogrel (PLAVIX) 75 MG tablet Take 2 tablets (150 mg total) by mouth once for 1 dose.  . fentaNYL (DURAGESIC - DOSED MCG/HR) 25 MCG/HR patch Place 25 mcg onto the skin every 3 (three) days.  . Fluticasone-Salmeterol (ADVAIR DISKUS) 250-50 MCG/DOSE AEPB Inhale 1 puff into the lungs 2 (two) times daily.   . hydrOXYzine (VISTARIL) 25 MG capsule Take 1 capsule (25 mg total) by mouth 3 (three) times daily as needed (for severe anxiety symptoms). (Patient not taking: Reported on 06/09/2019)  . liothyronine (CYTOMEL) 50 MCG tablet Take 50 mcg by mouth daily.  Marland Kitchen loratadine (CLARITIN) 10 MG tablet Take 10 mg by mouth daily.   Marland Kitchen losartan (COZAAR) 100 MG tablet Take by mouth.  . losartan-hydrochlorothiazide (HYZAAR) 100-25 MG tablet Take by mouth.  . meloxicam (MOBIC) 15 MG tablet Take 15 mg by mouth daily.  . mometasone (NASONEX) 50 MCG/ACT nasal spray Place 1 spray into the nose daily.  . montelukast (SINGULAIR) 10 MG tablet TAKE 1 TABLET BY MOUTH DAILY  . naloxone (NARCAN) nasal spray 4 mg/0.1 mL 4 mg (1 nasal spray) prn for opioid overdose; repeat every 2 to 3 minutes in alternating nostrils until medical assistance available  . nystatin cream (MYCOSTATIN) Apply topically.  Marland Kitchen oxyCODONE (ROXICODONE) 15 MG immediate release tablet Take by mouth.  . OXYGEN Inhale 2 L/min into the lungs.  . predniSONE (DELTASONE) 10 MG tablet Take 5 mg by mouth daily. Currently 5mg  qd  . sertraline (ZOLOFT) 100 MG tablet Take 1 tablet (100 mg total) by mouth daily. (Patient taking differently: Take 200 mg by mouth daily. 200mg   (2 of the 100mg  tabs) qd)  . theophylline (UNIPHYL) 400 MG 24 hr tablet Take 400 mg by mouth daily.  Marland Kitchen tiotropium (SPIRIVA HANDIHALER) 18 MCG inhalation capsule Place 18 mcg into inhaler and inhale daily.    No facility-administered encounter medications on file as of 07/04/2019.     PHYSICAL EXAM:   PE deferred d/t  telephonic only nature of telehealth visit.  Julianne Handler, NP

## 2019-07-11 ENCOUNTER — Other Ambulatory Visit (INDEPENDENT_AMBULATORY_CARE_PROVIDER_SITE_OTHER): Payer: Self-pay | Admitting: Vascular Surgery

## 2019-08-11 ENCOUNTER — Other Ambulatory Visit (INDEPENDENT_AMBULATORY_CARE_PROVIDER_SITE_OTHER): Payer: Self-pay | Admitting: Vascular Surgery

## 2019-11-05 ENCOUNTER — Other Ambulatory Visit (INDEPENDENT_AMBULATORY_CARE_PROVIDER_SITE_OTHER): Payer: Self-pay | Admitting: Nurse Practitioner

## 2019-11-07 NOTE — Telephone Encounter (Signed)
Patient in hospice care, so let's continue

## 2019-11-07 NOTE — Telephone Encounter (Signed)
Patient was last seen 07/06/18

## 2019-11-14 ENCOUNTER — Other Ambulatory Visit (INDEPENDENT_AMBULATORY_CARE_PROVIDER_SITE_OTHER): Payer: Self-pay | Admitting: Vascular Surgery

## 2019-11-14 NOTE — Telephone Encounter (Signed)
Please advise 

## 2019-11-14 NOTE — Telephone Encounter (Signed)
Patient currently in Hospice, continue with refills, although it also looks like this has been filled recently

## 2020-02-09 IMAGING — CT CT ANGIO NECK
2 of 9 series · 6 of 35 positions shown · IV contrast (omnipaque)
Comparison: None.

ADDENDUM:
Additional axial images submitted through the circle bullous
confirming LEFT A1-2 junction aneurysm. In addition, moderate LEFT
M1 stenosis.
CLINICAL DATA: Vision changes, headache. Assess for carotid artery
stenosis.

EXAM:
CT ANGIOGRAPHY NECK
TECHNIQUE: Multidetector CT imaging of the neck was performed using the
standard protocol during bolus administration of intravenous
contrast. Multiplanar CT image reconstructions and MIPs were
obtained to evaluate the vascular anatomy. Carotid stenosis
measurements (when applicable) are obtained utilizing NASCET
criteria, using the distal internal carotid diameter as the
denominator.
CONTRAST:  75mL OMNIPAQUE IOHEXOL 350 MG/ML SOLN

[Series 11: sag thin mips cta neck · sagittal · 0.42mm/px · 3 of 242 slices shown]
[im 54/242  soft-tissue]
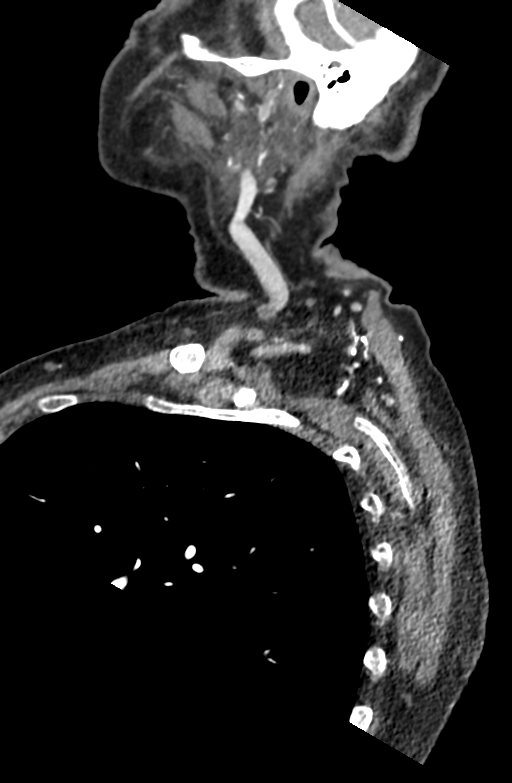
[im 121/242  soft-tissue]
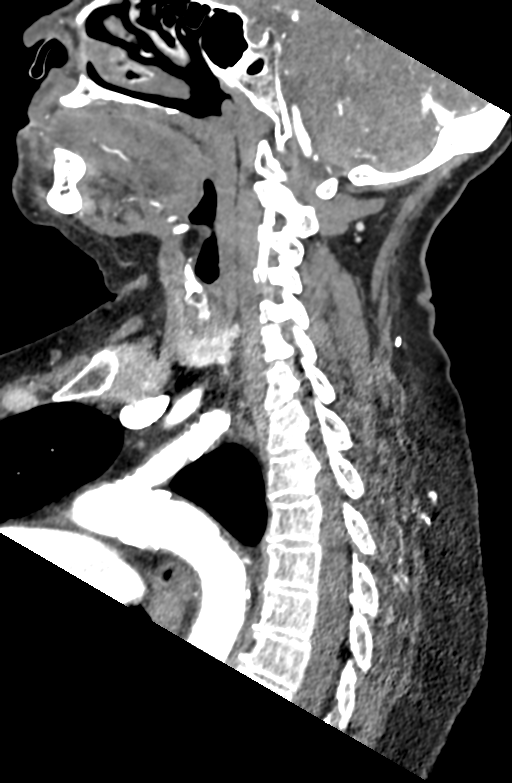
[im 189/242  soft-tissue]
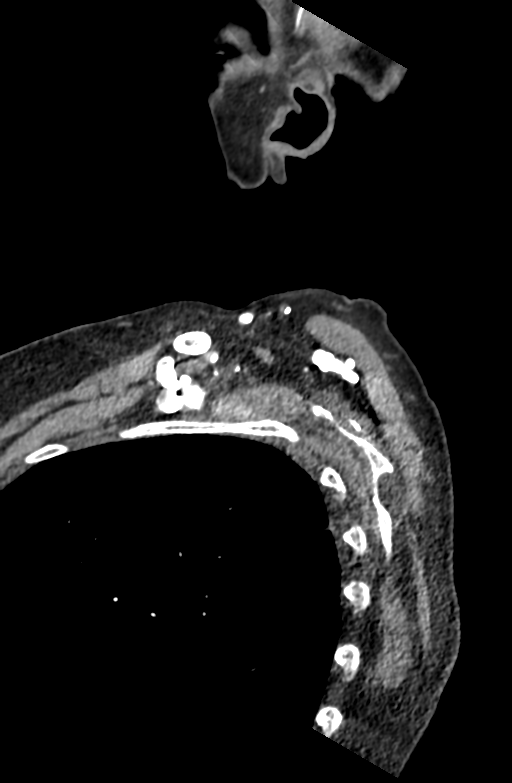

[Series 18: thin mips cta neck (person_name) · axial · 0.52mm/px · z∈[-772,-644]mm · 3 of 514 slices shown]
[im 129/514  soft-tissue]
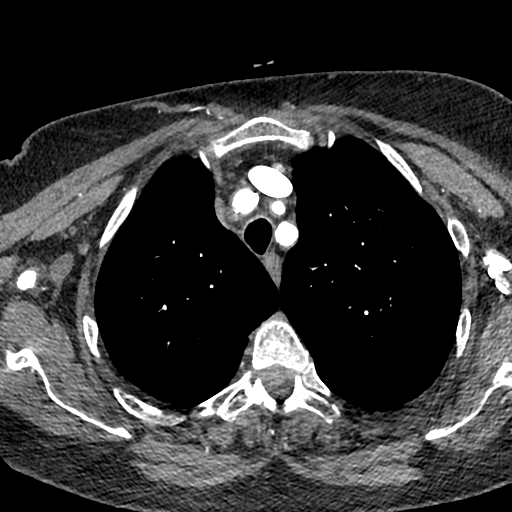
[im 257/514  bone]
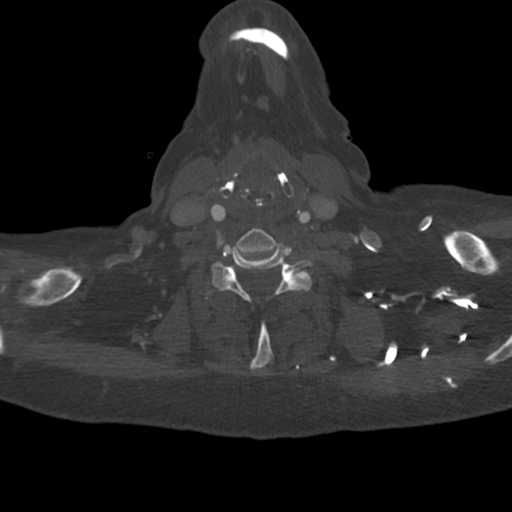
[im 385/514  soft-tissue]
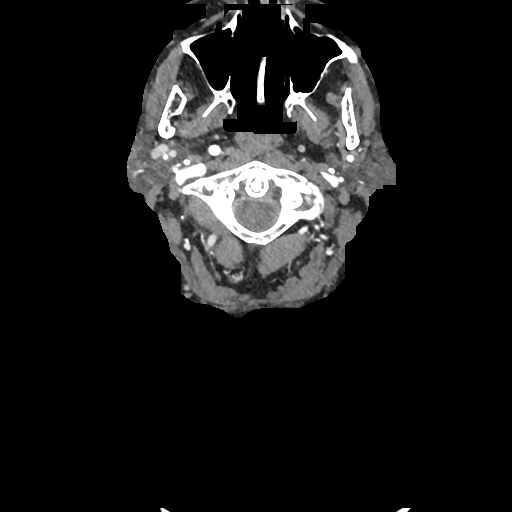

[6 of 35 positions shown; findings below may reference images not displayed]

FINDINGS: AORTIC ARCH: Normal appearance of the thoracic arch, Common origin
innominate artery and LEFT Common carotid artery.. Moderate calcific
atherosclerosis aortic arch. The origins of the innominate, left
Common carotid artery and subclavian artery are patent. Moderate
stenosis LEFT mid subclavian artery.

RIGHT CAROTID SYSTEM: Mild stenosis RIGHT Common carotid artery
origin due to calcific atherosclerosis. Common carotid artery is
patent with mild calcific atherosclerosis. Approximate 15 mm segment
of RIGHT internal carotid artery origin critical stenosis by NASCET
criteria due to severe calcific atherosclerosis. The remainder of
the internal carotid artery is patent.

LEFT CAROTID SYSTEM: Common carotid artery is patent, mild distal
stenosis due to calcific atherosclerosis. Moderate calcific
atherosclerosis LEFT carotid bifurcation. Occluded LEFT internal
carotid artery within 1 cm of the origin. No reconstitution in the
neck, reconstitution at LEFT cavernous segment. Hypoplastic LEFT A1
segment. 5 mm inferiorly directed LEFT A1-2 junction aneurysm
incompletely imaged

VERTEBRAL ARTERIES:Codominant vertebral arteries. Moderate stenosis
RIGHT vertebral artery origin. Tandem mild stenosis due to
atherosclerosis bilateral vertebral artery's which are patent.
Severe stenosis RIGHT focal severe stenosis RIGHT V4 segment due to
atherosclerosis. Tiny posterior communicating arteries present.

SKELETON: No acute osseous process though bone windows have not been
submitted. Patient is edentulous. Minimal old T1, T2 superior
endplate compression fractures. Osteopenia.

OTHER NECK: Soft tissues of the neck are nonacute though, not
tailored for evaluation.

UPPER CHEST: 7 mm solid LEFT upper lobe pulmonary nodule (series 6,
image 15). Mild centrilobular emphysema.
IMPRESSION: 1. Critical stenosis RIGHT internal carotid artery.
2. Occluded LEFT internal carotid artery, likely chronic with
reconstitution at cavernous segment.
3. Moderate stenosis RIGHT vertebral artery origin. Severe stenosis
RIGHT V4 segment.
4. 5 mm LEFT A-comm aneurysm.  Recommend CTA HEAD.
5. **An incidental finding of potential clinical significance has
been found. 7 mm LEFT upper lobe pulmonary nodule. Non-contrast
chest CT at 6-12 months is recommended. If the nodule is stable at
time of repeat CT, then future CT at 18-24 months (from today's
scan) is considered optional for low-risk patients, but is
recommended for high-risk patients. This recommendation follows the
consensus statement: Guidelines for Management of Incidental
Pulmonary Nodules Detected on CT Images: From the [HOSPITAL]

Aortic Atherosclerosis (YMIPV-QT1.1) and Emphysema (YMIPV-GYJ.B).

## 2020-03-01 ENCOUNTER — Other Ambulatory Visit (INDEPENDENT_AMBULATORY_CARE_PROVIDER_SITE_OTHER): Payer: Self-pay | Admitting: Nurse Practitioner

## 2020-05-24 ENCOUNTER — Other Ambulatory Visit (INDEPENDENT_AMBULATORY_CARE_PROVIDER_SITE_OTHER): Payer: Self-pay | Admitting: Nurse Practitioner

## 2020-05-24 NOTE — Telephone Encounter (Signed)
Pt has not been seen since 07/06/2018 for bilateral carotid stenosis she was seen by Dr. Lucky Cowboy.  Is the medication ok to refill?

## 2020-08-21 ENCOUNTER — Other Ambulatory Visit (INDEPENDENT_AMBULATORY_CARE_PROVIDER_SITE_OTHER): Payer: Self-pay | Admitting: Nurse Practitioner

## 2020-08-21 NOTE — Telephone Encounter (Signed)
The pt has not been seen in 2 years is this ok to refill?

## 2020-08-21 NOTE — Telephone Encounter (Signed)
This will be patient's last refill.  She needs to scheduled for a carotid duplex for further refills.

## 2020-08-21 NOTE — Telephone Encounter (Signed)
The pt does not have a VM to relay the message from the NP.

## 2020-09-19 ENCOUNTER — Other Ambulatory Visit (INDEPENDENT_AMBULATORY_CARE_PROVIDER_SITE_OTHER): Payer: Self-pay | Admitting: Nurse Practitioner

## 2020-09-19 NOTE — Telephone Encounter (Signed)
Is this ok to deny due to pt needing a F/U appointment has not been seen since 2019

## 2020-09-20 NOTE — Telephone Encounter (Signed)
Tried to call pt and make them aware but their phone is off

## 2020-10-15 ENCOUNTER — Other Ambulatory Visit (INDEPENDENT_AMBULATORY_CARE_PROVIDER_SITE_OTHER): Payer: Self-pay | Admitting: Nurse Practitioner

## 2021-02-03 DEATH — deceased
